# Patient Record
Sex: Female | Born: 1960
Health system: Southern US, Community
[De-identification: ages and names within clinical notes are randomized; demographics above are authoritative.]

## PROBLEM LIST (undated history)

## (undated) DIAGNOSIS — E119 Type 2 diabetes mellitus without complications: Secondary | ICD-10-CM

## (undated) DIAGNOSIS — C50919 Malignant neoplasm of unspecified site of unspecified female breast: Secondary | ICD-10-CM

## (undated) DIAGNOSIS — G4730 Sleep apnea, unspecified: Secondary | ICD-10-CM

## (undated) DIAGNOSIS — C801 Malignant (primary) neoplasm, unspecified: Secondary | ICD-10-CM

## (undated) DIAGNOSIS — I10 Essential (primary) hypertension: Secondary | ICD-10-CM

## (undated) DIAGNOSIS — Z9221 Personal history of antineoplastic chemotherapy: Secondary | ICD-10-CM

## (undated) DIAGNOSIS — Z85528 Personal history of other malignant neoplasm of kidney: Secondary | ICD-10-CM

## (undated) DIAGNOSIS — Z923 Personal history of irradiation: Secondary | ICD-10-CM

## (undated) HISTORY — DX: Malignant neoplasm of unspecified site of unspecified female breast: C50.919

## (undated) HISTORY — DX: Type 2 diabetes mellitus without complications: E11.9

## (undated) HISTORY — PX: COLONOSCOPY: SHX174

## (undated) HISTORY — DX: Essential (primary) hypertension: I10

## (undated) HISTORY — DX: Malignant (primary) neoplasm, unspecified: C80.1

## (undated) HISTORY — PX: BREAST SURGERY: SHX581

---

## 2005-03-15 HISTORY — PX: OTHER SURGICAL HISTORY: SHX169

## 2005-12-03 ENCOUNTER — Inpatient Hospital Stay (HOSPITAL_COMMUNITY): Admission: RE | Admit: 2005-12-03 | Discharge: 2005-12-06 | Payer: Self-pay | Admitting: General Surgery

## 2006-03-25 ENCOUNTER — Ambulatory Visit (HOSPITAL_COMMUNITY): Admission: RE | Admit: 2006-03-25 | Discharge: 2006-03-25 | Payer: Self-pay | Admitting: Urology

## 2010-09-22 DIAGNOSIS — D3A093 Benign carcinoid tumor of the kidney: Secondary | ICD-10-CM | POA: Insufficient documentation

## 2017-03-15 HISTORY — PX: BREAST LUMPECTOMY: SHX2

## 2017-07-14 ENCOUNTER — Other Ambulatory Visit: Payer: Self-pay | Admitting: Specialist

## 2017-07-14 DIAGNOSIS — N649 Disorder of breast, unspecified: Secondary | ICD-10-CM

## 2017-07-14 DIAGNOSIS — R928 Other abnormal and inconclusive findings on diagnostic imaging of breast: Secondary | ICD-10-CM

## 2017-08-03 ENCOUNTER — Ambulatory Visit
Admission: RE | Admit: 2017-08-03 | Discharge: 2017-08-03 | Disposition: A | Discharge status: Home / Self Care | Payer: 59 | Source: Ambulatory Visit | Attending: Specialist | Admitting: Specialist

## 2017-08-03 DIAGNOSIS — N649 Disorder of breast, unspecified: Secondary | ICD-10-CM

## 2017-08-03 DIAGNOSIS — R928 Other abnormal and inconclusive findings on diagnostic imaging of breast: Secondary | ICD-10-CM

## 2017-08-16 ENCOUNTER — Other Ambulatory Visit: Payer: Self-pay | Admitting: General Surgery

## 2017-08-16 DIAGNOSIS — C50211 Malignant neoplasm of upper-inner quadrant of right female breast: Secondary | ICD-10-CM

## 2017-08-17 ENCOUNTER — Telehealth: Payer: Self-pay | Admitting: Nurse Practitioner

## 2017-08-17 NOTE — Telephone Encounter (Signed)
Referral received from Dr. Dalbert Batman for new dx of breast cancer. Pt has been scheduled for the pt to see Cira Rue, NP/Dr. Burr Medico on 6/7 at 130pm. Pt aware to arrive 30 minutes early.

## 2017-08-19 ENCOUNTER — Encounter: Payer: Self-pay | Admitting: *Deleted

## 2017-08-19 ENCOUNTER — Telehealth: Payer: Self-pay | Admitting: Emergency Medicine

## 2017-08-19 ENCOUNTER — Encounter: Payer: Self-pay | Admitting: Radiation Oncology

## 2017-08-19 ENCOUNTER — Encounter: Payer: Self-pay | Admitting: Nurse Practitioner

## 2017-08-19 ENCOUNTER — Inpatient Hospital Stay: Payer: 59 | Attending: Nurse Practitioner | Admitting: Nurse Practitioner

## 2017-08-19 VITALS — BP 134/87 | HR 70 | Temp 98.5°F | Resp 17 | Ht 65.0 in | Wt 175.5 lb

## 2017-08-19 DIAGNOSIS — C50219 Malignant neoplasm of upper-inner quadrant of unspecified female breast: Secondary | ICD-10-CM | POA: Insufficient documentation

## 2017-08-19 DIAGNOSIS — F329 Major depressive disorder, single episode, unspecified: Secondary | ICD-10-CM

## 2017-08-19 DIAGNOSIS — C50211 Malignant neoplasm of upper-inner quadrant of right female breast: Secondary | ICD-10-CM | POA: Diagnosis present

## 2017-08-19 DIAGNOSIS — I10 Essential (primary) hypertension: Secondary | ICD-10-CM

## 2017-08-19 DIAGNOSIS — R200 Anesthesia of skin: Secondary | ICD-10-CM

## 2017-08-19 DIAGNOSIS — Z170 Estrogen receptor positive status [ER+]: Secondary | ICD-10-CM | POA: Diagnosis not present

## 2017-08-19 DIAGNOSIS — Z800 Family history of malignant neoplasm of digestive organs: Secondary | ICD-10-CM

## 2017-08-19 DIAGNOSIS — Z8553 Personal history of malignant neoplasm of renal pelvis: Secondary | ICD-10-CM

## 2017-08-19 DIAGNOSIS — G4700 Insomnia, unspecified: Secondary | ICD-10-CM

## 2017-08-19 NOTE — Progress Notes (Addendum)
[place]  [phone] [phone]  Clinic New Consult Note   Patient Care Team: Patient, No Pcp Per as PCP - General (General Practice) 08/19/2017  CHIEF COMPLAINTS/PURPOSE OF CONSULTATION:  Right breast cancer    REFERRED BY: Dr. Fanny Skates     Malignant neoplasm of upper-inner quadrant of breast in female, estrogen receptor positive ([place])   06/07/2017 Mammogram    BILAT SCREENING MAMMOGRAM  There is an irregular focal asymmetry or spiculated mass in the upper inner right breast middle third, located 7-8 cm from the nipple with a single punctate calcification posteriorly.  Additionally there is a small irregular mass or focal asymmetry more anteriorly in the inner central right breast, 5 cm from the nipple.  Between these 2 masses there is a stable grouping of calcifications.  Left breast is negative      06/20/2017 Mammogram    Right unilateral diagnostic mammogram: Redemonstrated irregular masslike focal asymmetry with architectural distortion in the upper inner breast with an associated punctate calcification measuring 1.1 cm.  Anterior to this is a more subtle area of focal architectural distortion.  Overall the area spans approximately 4 cm      06/20/2017 Breast US    Targeted ultrasound of the right breast reveals at 2 o'clock position 3-4 cm from the nipple there is a subtle irregular hypoechoic area with shadowing measuring 8 mm.  Incidentally noted at 3 o'clock position 6 cm from the nipple is an oval well-circumscribed hypoechoic mass measuring 1.6 x 0.8 x 1.1 cm      06/30/2017 Initial Biopsy    [place] needle core biopsy:  Diagnosis: Fibrotic tissue with increased adenosis, no tumor seen. Immunostains returned.  All glandular elements in the core biopsies are positive for CK 5/6, high molecular weight cytokeratin and E-Cadherin.  There is no focal increase in proliferation with a Ki-67 stain.  Both p63 and smooth muscle myosin exhibit  intact basilar and myoepithelial layers surrounding all glandular elements in the core biopsy sections.  These findings support a benign diagnosis.      07/12/2017 Mammogram    Diagnostic mammogram, post biopsy clip imaging: Biopsy clip is approximately 1.3 cm medial to the suspicious mass which was biopsied on [place]. There is a persistent asymmetry at the site of concern in the medial right breast middle depth. Further biopsy was recommended       08/03/2017 Pathology Results    Diagnosis 1. Breast, right, needle core biopsy, UIQ - INVASIVE MAMMARY CARCINOMA. - MAMMARY CARCINOMA IN SITU. - SEE MICROSCOPIC DESCRIPTION. 2. Breast, right, needle core biopsy, UIQ - INVASIVE MAMMARY CARCINOMA. - MAMMARY CARCINOMA IN SITU. - SEE MICROSCOPIC DESCRIPTION.  Microscopic Comment 1. There is invasive mammary carcinoma which has features suggestive of invasive lobular carcinoma. There is also a separate component which may represent an invasive ductal component. E-Cadherin immunohistochemistry will be performed as well as prognostic profile. 2. E-Cadherin and breast prognostic profile will be performed.  ADDENDUM: 1,2. Immunohistochemistry for E-Cadherin shows an invasive and in situ component that is E-Cadherin negative consistent with lobular carcinoma. There is also a separate morphologic component which is strongly E-Cadherin positive consistent with invasive and in situ ductal carcinoma. Basal cell markers for p63, calponin and smooth muscle myosin are negative in the invasive component supporting the diagnosis. (JDP:kh 08-04-17)  1. PROGNOSTIC INDICATORS Results: IMMUNOHISTOCHEMICAL AND MORPHOMETRIC ANALYSIS PERFORMED MANUALLY Estrogen Receptor: 100%, POSITIVE, STRONG STAINING INTENSITY Progesterone Receptor: 10%, POSITIVE, STRONG STAINING INTENSITY Proliferation Marker Ki67: 20%  1. FLUORESCENCE IN-SITU HYBRIDIZATION Results: HER2 - NEGATIVE RATIO OF HER2/CEP17 SIGNALS 1.41 AVERAGE HER2  COPY NUMBER PER CELL 1.90  2. PROGNOSTIC INDICATORS Results: IMMUNOHISTOCHEMICAL AND MORPHOMETRIC ANALYSIS PERFORMED MANUALLY Estrogen Receptor: 100%, POSITIVE, STRONG STAINING INTENSITY Progesterone Receptor: 20%, POSITIVE, STRONG STAINING INTENSITY Proliferation Marker Ki67: 20%  2. FLUORESCENCE IN-SITU HYBRIDIZATION Results: HER2 - NEGATIVE RATIO OF HER2/CEP17 SIGNALS 1.25 AVERAGE HER2 COPY NUMBER PER CELL 2.00      08/19/2017 Initial Diagnosis    Malignant neoplasm of upper-inner quadrant of breast in female, estrogen receptor positive ([place])      08/19/2017 Cancer Staging    Staging form: Breast, AJCC 8th Edition - Clinical stage from 08/19/2017: cT1c, cN0, cM0, ER+, PR+, HER2- - Signed by Alla Feeling, NP on 08/19/2017       HISTORY OF PRESENTING ILLNESS:  Vonya Ohalloran Roets 57 y.o. female is here because of newly diagnosed right breast cancer. She underwent screening mammogram in [place], [place] on 06/07/17 which showed 2 irregular masses vs focal asymmetry in the upper inner quadrant of the right breast. Reports compliance with annual screening mammograms without previous abnormality of breast biopsy. She has a history of right breast calcifications in the area which have been stable on mammogram. Targeted ultrasound of the right breast revealed an irregular hypoechoic area with shadowing measuring 8 mm at the 2 o'clock position, 2-4 cm from the nipple, as well as an oval, well-circumscribed hypoechoic mass measuring 1.6x0.8x1.1 cm at the 3 o'clock position, 6 cm from the nipple. US-guided core biopsy on 06/30/17 showed fibrotic tissue with increased adenosis but no malignant cells. She underwent further imaging due to the high suspicion for cancer. Diagnostic mammogram after biopsy clip placement revealed persistent asymmetry at the site of concern in the medial right breast middle depth. Repeat biopsy on 08/03/17 of the 2 areas positive for invasive mammary carcinoma. Immunohistochemistry  shows an invasive and in-situ component that is that is E-Cadherin negative, consistent with lobular carcinoma; there is also a separate morphologic component which is strongly E-Cadherin positive, consistent with invasive and in-situ ductal carcinoma. Prognostic indicators for both samples were ER/PR positive, HER2 negative. She was referred to Dr. Fanny Skates to discuss surgical options.    GYN HISTORY  Menarchal: age 21 LMP: 05/13/17, irregular Contraceptive: remotely during teenage years HRT: None G5P1, one son committed suicide   She has past medical history of renal cell carcinoma in 2007 s/p right partial nephrectomy, eventually discharged from f/u and controlled HTN. She lives at home in [place] with her spouse and mother. Husband works 3rd shift. 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 She is independent of all ADLs and drives herself. Denies drug or tobacco history, drinks 2 beers per day. Family history positive for stomach cancer in [facility] and kidney cancer in [facility].   Today she has soreness in her right breast and reports she did bruise significantly during biopsy, no nipple discharge, inversion, or skin changes. No fatigue or decreased appetite. Has hot flashes intermittently at night for about 2 months. She has few months history of right leg numbness and tingling without back pain or weakness. She will see PCP for this on 10/06/17. She has difficulty sleeping and mild depression since her diagnosis. Takes OTC sleep aide with benadryl which helps.   MEDICAL HISTORY:  Past Medical History:  Diagnosis Date  . Breast cancer ([place])   . [facility])    renal cell carcinoma 2007 s/p partial right nephrectomy   .  Hypertension     SURGICAL HISTORY: History reviewed. No pertinent surgical history.  SOCIAL HISTORY: Social History   Socioeconomic History  . Marital status: Married    Spouse name: Not on file  . Number of children: 4  . Years of education: Not on file   . Highest education level: Not on file  Occupational History  . Occupation: makes cookies    Employer: [employer]  Social Needs  . Financial resource strain: Not on file  . Food insecurity:    Worry: Not on file    Inability: Not on file  . Transportation needs:    Medical: Not on file    Non-medical: Not on file  Tobacco Use  . Smoking status: Never Smoker  . Smokeless tobacco: Never Used  Substance and Sexual Activity  . Alcohol use: Yes    Alcohol/week: 1.2 oz    Types: 2 Cans of beer per week    Comment: 2 beers per day  . Drug use: Not Currently  . Sexual activity: Not on file  Lifestyle  . Physical activity:    Days per week: Not on file    Minutes per session: Not on file  . Stress: Not on file  Relationships  . Social connections:    Talks on phone: Not on file    Gets together: Not on file    Attends religious service: Not on file    Active member of club or organization: Not on file    Attends meetings of clubs or organizations: Not on file    Relationship status: Not on file  . Intimate partner violence:    Fear of current or ex partner: Not on file    Emotionally abused: Not on file    Physically abused: Not on file    Forced sexual activity: Not on file  Other Topics Concern  . Not on file  Social History Narrative  . Not on file    FAMILY HISTORY: Family History  Problem Relation Age of Onset  . Cancer Maternal Grandmother        stomach  . Cancer Maternal Grandfather        kidney cancer    ALLERGIES:  has No Known Allergies.  MEDICATIONS:  Current Outpatient Medications  Medication Sig Dispense Refill  . amLODipine (NORVASC) 5 MG tablet     . benazepril-hydrochlorthiazide (LOTENSIN HCT) 5-6.25 MG tablet Take 1 tablet by mouth daily.    . diphenhydrAMINE HCl, Sleep, (SLEEP AID) 25 MG CAPS Take by mouth.    Marland Kitchen omeprazole (PRILOSEC) 10 MG capsule Take 10 mg by mouth daily.     No current facility-administered medications for this visit.      REVIEW OF SYSTEMS:   Constitutional: Denies fatigue, fevers, chills or abnormal night sweats Eyes: Denies blurriness of vision, double vision or watery eyes Ears, nose, mouth, throat, and face: Denies mucositis or sore throat Respiratory: Denies cough, dyspnea or wheezes Cardiovascular: Denies palpitation, chest discomfort or lower extremity swelling Gastrointestinal:  Denies nausea, vomiting, constipation, diarrhea, heartburn or change in bowel habits Skin: Denies abnormal skin rashes Lymphatics: Denies new lymphadenopathy or easy bruising Neurological:Denies new weaknesses (+) right leg numbness and tingling x3 - 4 months (+) mild intermittent hot flashes at night x2 months Behavioral/Psych: Mood is stable, no new changes (+) difficulty sleeping (+) mild depression since diagnosis Breasts: Denies nipple discharge or inversion, skin edema (+) Significant bruising with initial breast biopsy (+) occasional right breast soreness  All other systems  were reviewed with the patient and are negative.  PHYSICAL EXAMINATION: ECOG PERFORMANCE STATUS: 0 - Asymptomatic  Vitals:   08/19/17 1404  BP: 134/87  Pulse: 70  Resp: 17  Temp: 98.5 F (36.9 C)  SpO2: 98%   Filed Weights   08/19/17 1404  Weight: 175 lb 8 oz (79.6 kg)    GENERAL:alert, no distress and comfortable SKIN: no rashes or significant lesions EYES: normal, conjunctiva are pink and non-injected, sclera clear LYMPH:  no palpable cervical, supraclavicular, or axillary lymphadenopathy  LUNGS: clear to auscultation with normal breathing effort HEART: regular rate & rhythm and no murmurs and no lower extremity edema ABDOMEN:abdomen soft, non-tender and normal bowel sounds Musculoskeletal:no cyanosis of digits and no clubbing  PSYCH: alert & oriented x 3 with fluent speech NEURO: no focal motor/sensory deficits Breasts: Inspection shows them to be symmetrical without nipple discharge or inversion. No palpable mass in left  breast or axilla. (+) right breast s/p biopsy to the upper inner quadrant with ecchymosis, there is 1 cm soft tissue fullness at the 2:00 o'clock position which may represent hematoma vs mass   LABORATORY DATA:  I have reviewed the data as listed No flowsheet data found.  PATHOLOGY   [place] needle core biopsy:  Diagnosis: 06/30/17 Fibrotic tissue with increased adenosis, no tumor seen. Immunostains returned.  All glandular elements in the core biopsies are positive for CK 5/6, high molecular weight cytokeratin and E-Cadherin.  There is no focal increase in proliferation with a Ki-67 stain.  Both p63 and smooth muscle myosin exhibit intact basilar and myoepithelial layers surrounding all glandular elements in the core biopsy sections.  These findings support a benign diagnosis.   Diagnosis 08/03/17 1. Breast, right, needle core biopsy, UIQ - INVASIVE MAMMARY CARCINOMA. - MAMMARY CARCINOMA IN SITU. - SEE MICROSCOPIC DESCRIPTION. 2. Breast, right, needle core biopsy, UIQ - INVASIVE MAMMARY CARCINOMA. - MAMMARY CARCINOMA IN SITU. - SEE MICROSCOPIC DESCRIPTION.  Microscopic Comment 1. There is invasive mammary carcinoma which has features suggestive of invasive lobular carcinoma. There is also a separate component which may represent an invasive ductal component. E-Cadherin immunohistochemistry will be performed as well as prognostic profile. 2. E-Cadherin and breast prognostic profile will be performed.  ADDENDUM: 1,2. Immunohistochemistry for E-Cadherin shows an invasive and in situ component that is E-Cadherin negative consistent with lobular carcinoma. There is also a separate morphologic component which is strongly E-Cadherin positive consistent with invasive and in situ ductal carcinoma. Basal cell markers for p63, calponin and smooth muscle myosin are negative in the invasive component supporting the diagnosis. (JDP:kh 08-04-17)  1. PROGNOSTIC INDICATORS Results: IMMUNOHISTOCHEMICAL AND  MORPHOMETRIC ANALYSIS PERFORMED MANUALLY Estrogen Receptor: 100%, POSITIVE, STRONG STAINING INTENSITY Progesterone Receptor: 10%, POSITIVE, STRONG STAINING INTENSITY Proliferation Marker Ki67: 20%  1. FLUORESCENCE IN-SITU HYBRIDIZATION Results: HER2 - NEGATIVE RATIO OF HER2/CEP17 SIGNALS 1.41 AVERAGE HER2 COPY NUMBER PER CELL 1.90  2. PROGNOSTIC INDICATORS Results: IMMUNOHISTOCHEMICAL AND MORPHOMETRIC ANALYSIS PERFORMED MANUALLY Estrogen Receptor: 100%, POSITIVE, STRONG STAINING INTENSITY Progesterone Receptor: 20%, POSITIVE, STRONG STAINING INTENSITY Proliferation Marker Ki67: 20%  2. FLUORESCENCE IN-SITU HYBRIDIZATION Results: HER2 - NEGATIVE RATIO OF HER2/CEP17 SIGNALS 1.25 AVERAGE HER2 COPY NUMBER PER CELL 2.00    RADIOGRAPHIC STUDIES: I have personally reviewed the radiological images as listed and agreed with the findings in the report. Mm Clip Placement Right  Result Date: 08/03/2017 CLINICAL DATA:  Status post stereotactic guided core needle biopsy of a small asymmetry with architectural distortion in the posterior aspect of the upper inner quadrant  of the right breast and an area of architectural distortion more anteriorly in the upper inner quadrant of the right breast. EXAM: DIAGNOSTIC BILATERAL MAMMOGRAM POST STEREOTACTIC BIOPSY COMPARISON:  Previous exam(s). FINDINGS: 2D and 3D mammographic images were obtained following stereotactic guided biopsy of the recently demonstrated distortion in the upper inner small quadrant of the right breast and asymmetry with architectural the area of architectural distortion more anteriorly in the upper inner quadrant of the right breast. These demonstrate a post biopsy cavity at the medial aspect of the small asymmetry with architectural distortion with a coil shaped biopsy marker clip 9 mm posterior to the center of asymmetry with architectural distortion. A small portion medial aspect of the asymmetry with architectural distortion  appears to be absent following biopsy. An X shaped biopsy marker clip is located at the location of biopsied architectural distortion more anteriorly in the upper inner quadrant of the right breast. A previously placed linear shaped biopsy marker clip is more medially located in that region of the breast. IMPRESSION: X shaped biopsy marker clip at the location of biopsied architectural distortion more anteriorly in the upper inner quadrant of the right breast and coil shaped biopsy marker clip 9 mm posterior to the center of asymmetry with architectural distortion more posteriorly in the upper inner quadrant of the right breast. Final Assessment: Post Procedure Mammograms for Marker Placement Electronically Signed   By: Claudie Revering M.D.   On: 08/03/2017 13:02   Mm Rt Breast Bx W Loc Dev 1st Lesion Image Bx Spec Stereo Guide  Addendum Date: 08/04/2017   ADDENDUM REPORT: 08/04/2017 15:07 ADDENDUM: Pathology revealed INVASIVE MAMMARY CARCINOMA, MAMMARY CARCINOMA IN SITU of RIGHT breast, upper inner quadrant for the more posteriorly biopsied small asymmetry and architectural distortion. There is invasive mammary carcinoma which has features suggestive of invasive lobular carcinoma. There is also a separate component which may represent an invasive ductal component. This was found to be concordant by Dr. Claudie Revering. Pathology revealed INVASIVE MAMMARY CARCINOMA. MAMMARY CARCINOMA IN SITU of RIGHT breast, upper inner quadrant for the more anteriorly biopsied architectural distortion. This was found to be concordant by Dr. Claudie Revering. Pathology results will be discussed with the patient by Dr. Valma Cava of [facility], [place], [place] per his request. Dr. Valma Cava will also make any surgical referral necessary. A bracketed needle localization and excision of both areas and the tissue in between is recommended at the time of surgery if mastectomy is not planned. The coil shaped and X  shaped biopsy marker clips are located 4.6 cm apart on the post clip mammogram images. Pathology results reported by Roselind Messier, RN on 08/04/2017. Electronically Signed   By: Claudie Revering M.D.   On: 08/04/2017 15:07   Result Date: 08/04/2017 CLINICAL DATA:  Small asymmetry with associated architectural distortion in the upper inner quadrant of the right breast at recent mammography at [facility]. Recent right breast ultrasound-guided core needle biopsy with benign results. EXAM: RIGHT BREAST STEREOTACTIC CORE NEEDLE BIOPSY COMPARISON:  Previous exams. FINDINGS: The patient and I discussed the procedure of stereotactic-guided biopsy including benefits and alternatives. We discussed the high likelihood of a successful procedure. We discussed the risks of the procedure including infection, bleeding, tissue injury, clip migration, and inadequate sampling. Informed written consent was given. The usual time out protocol was performed immediately prior to the procedure. SITE 1: SMALL ASYMMETRY WITH ARCHITECTURAL DISTORTION IN THE UPPER INNER QUADRANT OF THE RIGHT BREAST Using sterile technique and  1% Lidocaine as local anesthetic, under stereotactic guidance, a 9 gauge vacuum assisted device was used to perform core needle biopsy of the recently demonstrated small asymmetry is architectural distortion in the upper inner quadrant of the right breast using a medial approach. Lesion quadrant: Upper inner quadrant At the conclusion of the procedure, a coil shaped tissue marker clip was deployed into the biopsy cavity. Follow-up 2-view mammogram was performed and dictated separately. SITE 2: ARCHITECTURAL DISTORTION IN THE UPPER INNER QUADRANT OF THE RIGHT BREAST Using sterile technique and 1% Lidocaine as local anesthetic, under stereotactic guidance, a 9 gauge vacuum assisted device was used to perform core needle biopsy of the recently demonstrated architectural distortion anterior to a group of mammographically stable  calcifications in the upper inner right breast using a medial approach. Lesion quadrant: Upper inner quadrant At the conclusion of the procedure, a X shaped tissue marker clip was deployed into the biopsy cavity. Follow-up 2-view mammogram was performed and dictated separately. IMPRESSION: Stereotactic-guided biopsy of a small asymmetry of architectural distortion in the upper inner quadrant of the right breast and an area of architectural distortion more anteriorly in the upper inner quadrant of the right breast adjacent to a mammographically stable group of calcifications. No apparent complications. Electronically Signed: By: Claudie Revering M.D. On: 08/03/2017 12:49   Mm Rt Breast Bx W Loc Dev Ea Ad Lesion Img Bx Spec Stereo Guide  Addendum Date: 08/04/2017   ADDENDUM REPORT: 08/04/2017 15:07 ADDENDUM: Pathology revealed INVASIVE MAMMARY CARCINOMA, MAMMARY CARCINOMA IN SITU of RIGHT breast, upper inner quadrant for the more posteriorly biopsied small asymmetry and architectural distortion. There is invasive mammary carcinoma which has features suggestive of invasive lobular carcinoma. There is also a separate component which may represent an invasive ductal component. This was found to be concordant by Dr. Claudie Revering. Pathology revealed INVASIVE MAMMARY CARCINOMA. MAMMARY CARCINOMA IN SITU of RIGHT breast, upper inner quadrant for the more anteriorly biopsied architectural distortion. This was found to be concordant by Dr. Claudie Revering. Pathology results will be discussed with the patient by Dr. Valma Cava of [facility] [place], [place], [place] per his request. Dr. Valma Cava will also make any surgical referral necessary. A bracketed needle localization and excision of both areas and the tissue in between is recommended at the time of surgery if mastectomy is not planned. The coil shaped and X shaped biopsy marker clips are located 4.6 cm apart on the post clip mammogram images. Pathology  results reported by Roselind Messier, RN on 08/04/2017. Electronically Signed   By: Claudie Revering M.D.   On: 08/04/2017 15:07   Result Date: 08/04/2017 CLINICAL DATA:  Small asymmetry with associated architectural distortion in the upper inner quadrant of the right breast at recent mammography at [facility]. Recent right breast ultrasound-guided core needle biopsy with benign results. EXAM: RIGHT BREAST STEREOTACTIC CORE NEEDLE BIOPSY COMPARISON:  Previous exams. FINDINGS: The patient and I discussed the procedure of stereotactic-guided biopsy including benefits and alternatives. We discussed the high likelihood of a successful procedure. We discussed the risks of the procedure including infection, bleeding, tissue injury, clip migration, and inadequate sampling. Informed written consent was given. The usual time out protocol was performed immediately prior to the procedure. SITE 1: SMALL ASYMMETRY WITH ARCHITECTURAL DISTORTION IN THE UPPER INNER QUADRANT OF THE RIGHT BREAST Using sterile technique and 1% Lidocaine as local anesthetic, under stereotactic guidance, a 9 gauge vacuum assisted device was used to perform core needle biopsy of the recently  demonstrated small asymmetry is architectural distortion in the upper inner quadrant of the right breast using a medial approach. Lesion quadrant: Upper inner quadrant At the conclusion of the procedure, a coil shaped tissue marker clip was deployed into the biopsy cavity. Follow-up 2-view mammogram was performed and dictated separately. SITE 2: ARCHITECTURAL DISTORTION IN THE UPPER INNER QUADRANT OF THE RIGHT BREAST Using sterile technique and 1% Lidocaine as local anesthetic, under stereotactic guidance, a 9 gauge vacuum assisted device was used to perform core needle biopsy of the recently demonstrated architectural distortion anterior to a group of mammographically stable calcifications in the upper inner right breast using a medial approach. Lesion quadrant: Upper  inner quadrant At the conclusion of the procedure, a X shaped tissue marker clip was deployed into the biopsy cavity. Follow-up 2-view mammogram was performed and dictated separately. IMPRESSION: Stereotactic-guided biopsy of a small asymmetry of architectural distortion in the upper inner quadrant of the right breast and an area of architectural distortion more anteriorly in the upper inner quadrant of the right breast adjacent to a mammographically stable group of calcifications. No apparent complications. Electronically Signed: By: Claudie Revering M.D. On: 08/03/2017 12:49    ASSESSMENT & PLAN: Ashlea Dusing is a pleasant 57 yo perimenopausal female with PMH of renal cell carcinoma in 2007 s/p nephrectomy and HTN, found to have 2 right breast masses on screening mammogram.   1. Malignant neoplasm of upper inner quadrant of right female breast, multifocal disease; cT1b, cN0, cM0, stage I; multifocal disease with one lesion ER 100% positive, PR 10% positive, HER2 negative, Ki67 20%, second lesion ER 100% positive, PR 20% positive, HER2 negative, Ki67 20%; IHC stains consistent with invasive lobular and in situ and invasive ductal and in situ carcinomas -We reviewed her medical record including imaging and pathology in detail with the patient.  She has biopsy-proven multifocal disease in the upper inner quadrant of the right breast, consistent with lobular and ductal carcinomas.  She is scheduled to undergo bilateral breast MRI next week to reevaluate right breast cancers and rule out abnormality in the left breast.  We would likely recommend additional biopsy if any suspicious abnormalities were seen.   -If no worrisome findings on her MRI, she would likely be a candidate for breast conserving surgery, she has seen Dr. Dalbert Batman. -We reviewed adjuvant chemotherapy would depend on the final surgical path and cancer biology including any high risk features.  Her phone recommends Oncotype on the surgical sample  if greater than 1 cm or MammaPrint with positive lymph node; testing would further stratify her risk of distant cancer recurrence and help define the potential benefit of adjuvant chemo.  Patient agrees -The patient met breast RN navigator Sigmund Hazel who will follow her surgical sample and order Oncotype as indicated -Given her ER/PR positive disease she would benefit from adjuvant endocrine therapy to reduce risk of cancer recurrence, we briefly discussed the risks and benefits of antiestrogen therapy.  Given lobular carcinoma she would likely be on therapy 7 to 10 years; will discuss further as the time comes. -If she does undergo breast conserving surgery, she would likely benefit from adjuvant radiation to reduce her risk of local recurrence.  I referred her to radiation oncology today. -Given her personal history of renal cell carcinoma and family history of cancer I recommend genetic testing to evaluate for pathogenic mutation, she agrees.  -Will follow her MRI results and surgical pathology with Oncotype. Will see her after radiation, or sooner if Oncotype  shows high risk -F/u up open at this point  2. Genetics -Given her family history of cancer and personal history of renal cell carcinoma and now breast cancer, I recommend genetic testing; she agreed. I referred her.  3.  HTN -Appears well-controlled on amlodipine and benazepril-HCTZ  4.  Insomnia, depression -She has difficulty sleeping and feelings of mild depression since her diagnosis.  She has not told her mother who is in poor health.  She has told her husband and sons who are very supportive.  She presents alone today but notes that next time she will bring family member with her.  I encouraged to let her family support her. She declined social work and chaplain referral today  5.  Right leg numbness, tingling -Etiology unclear, she plans to meet with PCP next month for evaluation  PLAN: -Imaging, pathology reviewed -Bilateral  breast MRI next week -Pending breast surgery, Oncotype if surgical sample >1 cm or mammaprint if positive LN -Referrals to genetics, rad onc -F/u after radiation, or sooner if oncotype is high risk   Orders Placed This Encounter  Procedures  . Ambulatory referral to Genetics    Referral Priority:   Routine    Referral Type:   Consultation    Referral Reason:   Specialty Services Required    Number of Visits Requested:   1  . Ambulatory referral to Radiation Oncology    Referral Priority:   Routine    Referral Type:   Consultation    Referral Reason:   Specialty Services Required    Requested Specialty:   Radiation Oncology    Number of Visits Requested:   1    All questions were answered. The patient knows to call the clinic with any problems, questions or concerns. I spent 45 minutes counseling the patient face to face. The total time spent in the appointment was 60 minutes and more than 50% was on counseling.     Alla Feeling, NP 08/19/2017   I have seen the patient, examined her. I agree with the assessment and and plan and have edited the notes.   Ms Stiner is a 57 yo perimenopausal female with PMH of HTN and depression, presented with screening discovered right breast cancer.I have reviewed her mammogram, ultrasound, and biopsy results with her in details.  She has a  2 small lesions in the right breast, biopsy showed invasive lobular carcinoma, ER and PR strongly positive, HER-2 negative, with Ki6720%.  She is scheduled to have bilateral breast MRI next weekk, to rule out more multifocal disease, re-evaluate size of the tumor and adenopathy.  If no surprises on the MRI, she would be a candidate for breast conservation. I recommend her to have a Oncotype DX on her surgical sample if the tumor is 1cm or larger, or mammaprint if she has positive lymph nodes, to further define her risk of recurrence and the benefit of adjuvant chemotherapy.  Given her lobular histology, low Ki-67, I  think this is likely low risk disease.  I strongly recommend her to consider adjuvant antiestrogen therapy.  She is perimenopausal, I would recommend tamoxifen.  Potential benefits and side effects discussed with her. I recommend Tamoxifen. Potential benefits and side effects discussed, she is interested.   I will see her after radiation, or sooner if her oncotype shows high risk disease  Truitt Merle  08/19/2017

## 2017-08-19 NOTE — Telephone Encounter (Signed)
Called both the pt and pts husband to see if they were coming to new appt today. The pts phone is disconnected and the husband has no VM set up. NP made aware.

## 2017-08-20 ENCOUNTER — Telehealth: Payer: Self-pay | Admitting: Nurse Practitioner

## 2017-08-20 ENCOUNTER — Telehealth: Payer: Self-pay | Admitting: Hematology

## 2017-08-20 NOTE — Telephone Encounter (Signed)
No LOS 6/7

## 2017-08-20 NOTE — Telephone Encounter (Signed)
Appointments scheduled letter/calendar mailed to patient per 6/7 sch msg

## 2017-08-22 NOTE — Progress Notes (Signed)
Location of Breast Cancer: Right Breast  Histology per Pathology Report:  08/03/17 Diagnosis 1. Breast, right, needle core biopsy, UIQ - INVASIVE MAMMARY CARCINOMA. - MAMMARY CARCINOMA IN SITU. - SEE MICROSCOPIC DESCRIPTION.  Receptor Status: ER(100%), PR(10%), Her2neu- (NEG), Ki-(20%)  2. Breast, right, needle core biopsy, UIQ - INVASIVE MAMMARY CARCINOMA. - MAMMARY CARCINOMA IN SITU. - SEE MICROSCOPIC DESCRIPTION.  Receptor Status: ER(100%), PR (20%), Her2-neu (NEG), Ki-(20%)  Did patient present with symptoms or was this found on screening mammography?: It was found on a screening mammogram.   Past/Anticipated interventions by surgeon, if any: Dr. Dalbert Batman. She will return to see Dr. Dalbert Batman after oncology appointments.   Past/Anticipated interventions by medical oncology, if any:  08/19/17 Cira Rue NP PLAN: -Imaging, pathology reviewed -Bilateral breast MRI next week (08/24/17 scheduled) -Pending breast surgery, Oncotype if surgical sample >1 cm or mammaprint if positive LN -Referrals to genetics, rad onc (genetics 09/28/17) -F/u after radiation, or sooner if oncotype is high risk    Lymphedema issues, if any:  No  Pain issues, if any:  No  SAFETY ISSUES:  Prior radiation? No  Pacemaker/ICD? No  Possible current pregnancy? No, she is post menopausal  Is the patient on methotrexate? No  Current Complaints / other details:    BP (!) 144/92 (BP Location: Left Arm, Patient Position: Sitting, Cuff Size: Normal)   Pulse 71   Temp 97.8 F (36.6 C) (Oral)   Resp 20   Ht 5' 6"  (1.676 m)   Wt 174 lb 6.4 oz (79.1 kg)   SpO2 99%   BMI 28.15 kg/m    Wt Readings from Last 3 Encounters:  08/23/17 174 lb 6.4 oz (79.1 kg)  08/19/17 175 lb 8 oz (79.6 kg)      Jennifer Harvey, Stephani Police, RN 08/22/2017,8:27 AM

## 2017-08-23 ENCOUNTER — Ambulatory Visit
Admission: RE | Admit: 2017-08-23 | Discharge: 2017-08-23 | Disposition: A | Discharge status: Home / Self Care | Payer: 59 | Source: Ambulatory Visit | Attending: Radiation Oncology | Admitting: Radiation Oncology

## 2017-08-23 ENCOUNTER — Other Ambulatory Visit: Payer: Self-pay

## 2017-08-23 ENCOUNTER — Encounter: Payer: Self-pay | Admitting: Radiation Oncology

## 2017-08-23 VITALS — BP 144/92 | HR 71 | Temp 97.8°F | Resp 20 | Ht 66.0 in | Wt 174.4 lb

## 2017-08-23 DIAGNOSIS — R200 Anesthesia of skin: Secondary | ICD-10-CM | POA: Insufficient documentation

## 2017-08-23 DIAGNOSIS — Z809 Family history of malignant neoplasm, unspecified: Secondary | ICD-10-CM | POA: Insufficient documentation

## 2017-08-23 DIAGNOSIS — R51 Headache: Secondary | ICD-10-CM | POA: Insufficient documentation

## 2017-08-23 DIAGNOSIS — Z79899 Other long term (current) drug therapy: Secondary | ICD-10-CM | POA: Diagnosis not present

## 2017-08-23 DIAGNOSIS — Z905 Acquired absence of kidney: Secondary | ICD-10-CM | POA: Diagnosis not present

## 2017-08-23 DIAGNOSIS — C50211 Malignant neoplasm of upper-inner quadrant of right female breast: Secondary | ICD-10-CM | POA: Insufficient documentation

## 2017-08-23 DIAGNOSIS — Z85528 Personal history of other malignant neoplasm of kidney: Secondary | ICD-10-CM | POA: Insufficient documentation

## 2017-08-23 DIAGNOSIS — Z170 Estrogen receptor positive status [ER+]: Principal | ICD-10-CM

## 2017-08-23 DIAGNOSIS — I10 Essential (primary) hypertension: Secondary | ICD-10-CM | POA: Diagnosis not present

## 2017-08-23 NOTE — Progress Notes (Signed)
Radiation Oncology         [phone] ________________________________  Initial outpatient Consultation  Name: Jennifer Harvey MRN: [id number]  Date: 08/23/2017  DOB: 01-Jan-1961  XQ:JJHERDE, No Pcp Per  Fanny Skates, MD   REFERRING PHYSICIAN: Fanny Skates, MD  DIAGNOSIS:    ICD-10-CM   1. Malignant neoplasm of upper-inner quadrant of right breast in female, estrogen receptor positive ([place]) [place] [facility] referral to Social Work   Z17.0   Cancer Staging Malignant neoplasm of upper-inner quadrant of breast in female, estrogen receptor positive ([place]) Staging form: Breast, AJCC 8th Edition - Clinical stage from 08/19/2017: cT1c, cN0, cM0, ER+, PR+, HER2- - Signed by Alla Feeling, NP on 08/19/2017 - Pathologic: No stage assigned - Unsigned  Clinical Stage IA, T1cN0M0 Right Breast UIQ Invasive mammary carcinoma, favoring lobular carcinoma, ER 100% / PR 10-20% / Her2 neg, Grade not reported  CHIEF COMPLAINT: Here to discuss management of right breast cancer  HISTORY OF PRESENT ILLNESS::Jennifer Harvey is a 57 y.o. female who presented with two areas of architectural distortion of the UIQ of right breast on mammography in [place].  2 irregular masses or focal asymmetries were appreciated in the inner central upper right breast with stable grouped calcifications between them.  The area spanned approximately 4 cm in total and on ultrasound there was an incidental oval well-circumscribed hypoechoic mass that was 16 mm in dimension at 3:00, not corresponding to anything from mammography.  2 biopsies showed invasive lobular carcinoma, ER 100%, PR 10-20%, HER2 negative. There was also  mammary carcinoma in situ in the biopsied tissue. Dr. Dalbert Batman has ordered an MRI for tomorrow. Medical oncology will consider oncotype testing based on surgical stage. Dr. Dalbert Batman believes lumpectomy may be possible, but needs MRI to be sure. The patient has a genetics appointment next month. She is  motivated for breast conservation. She is postmenopausal. She recently quit alcohol use and is a nonsmoker.   She reports that she had a partial nephrectomy for kidney cancer 12 years ago.  On review of systems, patient notes right leg numbness and restlessness at night. Also positive for headaches and mild difficulty swallowing nuts.  PREVIOUS RADIATION THERAPY: No  PAST MEDICAL HISTORY:  has a past medical history of Breast cancer ([place]), Cancer ([place]), and Hypertension.    PAST SURGICAL HISTORY: Past Surgical History:  Procedure Laterality Date  . CESAREAN SECTION  1994    FAMILY HISTORY: family history includes Cancer in her maternal grandfather and maternal grandmother.  SOCIAL HISTORY:  reports that she has never smoked. She has never used smokeless tobacco. She reports that she drinks about 1.2 oz of alcohol per week. She reports that she has current or past drug history.  ALLERGIES: Patient has no known allergies.  MEDICATIONS:  Current Outpatient Medications  Medication Sig Dispense Refill  . amLODipine (NORVASC) 5 MG tablet     . benazepril-hydrochlorthiazide (LOTENSIN HCT) 5-6.25 MG tablet Take 1 tablet by mouth daily.    . diphenhydrAMINE HCl, Sleep, (SLEEP AID) 25 MG CAPS Take by mouth.    Marland Kitchen omeprazole (PRILOSEC) 10 MG capsule Take 10 mg by mouth daily.     No current facility-administered medications for this encounter.     REVIEW OF SYSTEMS: A 10+ POINT REVIEW OF SYSTEMS WAS OBTAINED including neurology, dermatology, psychiatry, cardiac, respiratory, lymph, extremities, GI, GU, Musculoskeletal, constitutional, breasts, reproductive, HEENT.  All pertinent positives are noted in the HPI.  All others are negative.   PHYSICAL EXAM:  height is 5' 6"  (1.676 m) and weight is 174 lb 6.4 oz (79.1 kg). Her oral temperature is 97.8 F (36.6 C). Her blood pressure is 144/92 (abnormal) and her pulse is 71. Her respiration is 20 and oxygen saturation is 99%.   General: Alert and  oriented, in no acute distress HEENT: Head is normocephalic. Extraocular movements are intact. Oropharynx is clear. Neck: Neck is supple, no palpable cervical or supraclavicular lymphadenopathy. Heart: Regular in rate and rhythm with no rubs, or gallops. Soft systolic murmur  at the RUSB.  Chest: Clear to auscultation bilaterally, with no rhonchi, wheezes, or rales. Abdomen: Soft, nontender, nondistended, with no rigidity or guarding. Extremities: No cyanosis or edema. Lymphatics: see Neck Exam Skin: No concerning lesions. Musculoskeletal: symmetric strength and muscle tone throughout. Neurologic: Cranial nerves II through XII are grossly intact. No obvious focalities. Speech is fluent. Coordination is intact. Psychiatric: Judgment and insight are intact. Affect is appropriate. Breasts: non-specific thickening of the upper inner right breast. No other palpable masses appreciated in the breasts or axillae.  ECOG = 0  0 - Asymptomatic (Fully active, able to carry on all predisease activities without restriction)  1 - Symptomatic but completely ambulatory (Restricted in physically strenuous activity but ambulatory and able to carry out work of a light or sedentary nature. For example, light housework, office work)  2 - Symptomatic, <50% in bed during the day (Ambulatory and capable of all self care but unable to carry out any work activities. Up and about more than 50% of waking hours)  3 - Symptomatic, >50% in bed, but not bedbound (Capable of only limited self-care, confined to bed or chair 50% or more of waking hours)  4 - Bedbound (Completely disabled. Cannot carry on any self-care. Totally confined to bed or chair)  5 - Death   Eustace Pen MM, Creech RH, Tormey DC, et al. [phone]). "Toxicity and response criteria of the [facility]". [place] Oncol. 5 (6): 649-55   LABORATORY DATA:  No results found for: WBC, HGB, HCT, MCV, PLT CMP  No results found for: NA, K,  CL, CO2, GLUCOSE, BUN, CREATININE, CALCIUM, PROT, ALBUMIN, AST, ALT, ALKPHOS, BILITOT, GFRNONAA, GFRAA     RADIOGRAPHY: Mm Clip Placement Right  Result Date: 08/03/2017 CLINICAL DATA:  Status post stereotactic guided core needle biopsy of a small asymmetry with architectural distortion in the posterior aspect of the upper inner quadrant of the right breast and an area of architectural distortion more anteriorly in the upper inner quadrant of the right breast. EXAM: DIAGNOSTIC BILATERAL MAMMOGRAM POST STEREOTACTIC BIOPSY COMPARISON:  Previous exam(s). FINDINGS: 2D and 3D mammographic images were obtained following stereotactic guided biopsy of the recently demonstrated distortion in the upper inner small quadrant of the right breast and asymmetry with architectural the area of architectural distortion more anteriorly in the upper inner quadrant of the right breast. These demonstrate a post biopsy cavity at the medial aspect of the small asymmetry with architectural distortion with a coil shaped biopsy marker clip 9 mm posterior to the center of asymmetry with architectural distortion. A small portion medial aspect of the asymmetry with architectural distortion appears to be absent following biopsy. An X shaped biopsy marker clip is located at the location of biopsied architectural distortion more anteriorly in the upper inner quadrant of the right breast. A previously placed linear shaped biopsy marker clip is more medially located in that region of the breast. IMPRESSION: X shaped biopsy marker clip at the location  of biopsied architectural distortion more anteriorly in the upper inner quadrant of the right breast and coil shaped biopsy marker clip 9 mm posterior to the center of asymmetry with architectural distortion more posteriorly in the upper inner quadrant of the right breast. Final Assessment: Post Procedure Mammograms for Marker Placement Electronically Signed   By: Claudie Revering M.D.   On: 08/03/2017  13:02   Mm Rt Breast Bx W Loc Dev 1st Lesion Image Bx Spec Stereo Guide  Addendum Date: 08/04/2017   ADDENDUM REPORT: 08/04/2017 15:07 ADDENDUM: Pathology revealed INVASIVE MAMMARY CARCINOMA, MAMMARY CARCINOMA IN SITU of RIGHT breast, upper inner quadrant for the more posteriorly biopsied small asymmetry and architectural distortion. There is invasive mammary carcinoma which has features suggestive of invasive lobular carcinoma. There is also a separate component which may represent an invasive ductal component. This was found to be concordant by Dr. Claudie Revering. Pathology revealed INVASIVE MAMMARY CARCINOMA. MAMMARY CARCINOMA IN SITU of RIGHT breast, upper inner quadrant for the more anteriorly biopsied architectural distortion. This was found to be concordant by Dr. Claudie Revering. Pathology results will be discussed with the patient by Dr. Valma Cava of [facility], [place], [place] per his request. Dr. Valma Cava will also make any surgical referral necessary. A bracketed needle localization and excision of both areas and the tissue in between is recommended at the time of surgery if mastectomy is not planned. The coil shaped and X shaped biopsy marker clips are located 4.6 cm apart on the post clip mammogram images. Pathology results reported by Roselind Messier, RN on 08/04/2017. Electronically Signed   By: Claudie Revering M.D.   On: 08/04/2017 15:07   Result Date: 08/04/2017 CLINICAL DATA:  Small asymmetry with associated architectural distortion in the upper inner quadrant of the right breast at recent mammography at [facility]. Recent right breast ultrasound-guided core needle biopsy with benign results. EXAM: RIGHT BREAST STEREOTACTIC CORE NEEDLE BIOPSY COMPARISON:  Previous exams. FINDINGS: The patient and I discussed the procedure of stereotactic-guided biopsy including benefits and alternatives. We discussed the high likelihood of a successful procedure. We discussed the risks of  the procedure including infection, bleeding, tissue injury, clip migration, and inadequate sampling. Informed written consent was given. The usual time out protocol was performed immediately prior to the procedure. SITE 1: SMALL ASYMMETRY WITH ARCHITECTURAL DISTORTION IN THE UPPER INNER QUADRANT OF THE RIGHT BREAST Using sterile technique and 1% Lidocaine as local anesthetic, under stereotactic guidance, a 9 gauge vacuum assisted device was used to perform core needle biopsy of the recently demonstrated small asymmetry is architectural distortion in the upper inner quadrant of the right breast using a medial approach. Lesion quadrant: Upper inner quadrant At the conclusion of the procedure, a coil shaped tissue marker clip was deployed into the biopsy cavity. Follow-up 2-view mammogram was performed and dictated separately. SITE 2: ARCHITECTURAL DISTORTION IN THE UPPER INNER QUADRANT OF THE RIGHT BREAST Using sterile technique and 1% Lidocaine as local anesthetic, under stereotactic guidance, a 9 gauge vacuum assisted device was used to perform core needle biopsy of the recently demonstrated architectural distortion anterior to a group of mammographically stable calcifications in the upper inner right breast using a medial approach. Lesion quadrant: Upper inner quadrant At the conclusion of the procedure, a X shaped tissue marker clip was deployed into the biopsy cavity. Follow-up 2-view mammogram was performed and dictated separately. IMPRESSION: Stereotactic-guided biopsy of a small asymmetry of architectural distortion in the upper inner quadrant of the  right breast and an area of architectural distortion more anteriorly in the upper inner quadrant of the right breast adjacent to a mammographically stable group of calcifications. No apparent complications. Electronically Signed: By: Claudie Revering M.D. On: 08/03/2017 12:49   Mm Rt Breast Bx W Loc Dev Ea Ad Lesion Img Bx Spec Stereo Guide  Addendum Date:  08/04/2017   ADDENDUM REPORT: 08/04/2017 15:07 ADDENDUM: Pathology revealed INVASIVE MAMMARY CARCINOMA, MAMMARY CARCINOMA IN SITU of RIGHT breast, upper inner quadrant for the more posteriorly biopsied small asymmetry and architectural distortion. There is invasive mammary carcinoma which has features suggestive of invasive lobular carcinoma. There is also a separate component which may represent an invasive ductal component. This was found to be concordant by Dr. Claudie Revering. Pathology revealed INVASIVE MAMMARY CARCINOMA. MAMMARY CARCINOMA IN SITU of RIGHT breast, upper inner quadrant for the more anteriorly biopsied architectural distortion. This was found to be concordant by Dr. Claudie Revering. Pathology results will be discussed with the patient by Dr. Valma Cava of [facility] [place], [place], [place] per his request. Dr. Valma Cava will also make any surgical referral necessary. A bracketed needle localization and excision of both areas and the tissue in between is recommended at the time of surgery if mastectomy is not planned. The coil shaped and X shaped biopsy marker clips are located 4.6 cm apart on the post clip mammogram images. Pathology results reported by Roselind Messier, RN on 08/04/2017. Electronically Signed   By: Claudie Revering M.D.   On: 08/04/2017 15:07   Result Date: 08/04/2017 CLINICAL DATA:  Small asymmetry with associated architectural distortion in the upper inner quadrant of the right breast at recent mammography at [facility]. Recent right breast ultrasound-guided core needle biopsy with benign results. EXAM: RIGHT BREAST STEREOTACTIC CORE NEEDLE BIOPSY COMPARISON:  Previous exams. FINDINGS: The patient and I discussed the procedure of stereotactic-guided biopsy including benefits and alternatives. We discussed the high likelihood of a successful procedure. We discussed the risks of the procedure including infection, bleeding, tissue injury, clip migration, and inadequate  sampling. Informed written consent was given. The usual time out protocol was performed immediately prior to the procedure. SITE 1: SMALL ASYMMETRY WITH ARCHITECTURAL DISTORTION IN THE UPPER INNER QUADRANT OF THE RIGHT BREAST Using sterile technique and 1% Lidocaine as local anesthetic, under stereotactic guidance, a 9 gauge vacuum assisted device was used to perform core needle biopsy of the recently demonstrated small asymmetry is architectural distortion in the upper inner quadrant of the right breast using a medial approach. Lesion quadrant: Upper inner quadrant At the conclusion of the procedure, a coil shaped tissue marker clip was deployed into the biopsy cavity. Follow-up 2-view mammogram was performed and dictated separately. SITE 2: ARCHITECTURAL DISTORTION IN THE UPPER INNER QUADRANT OF THE RIGHT BREAST Using sterile technique and 1% Lidocaine as local anesthetic, under stereotactic guidance, a 9 gauge vacuum assisted device was used to perform core needle biopsy of the recently demonstrated architectural distortion anterior to a group of mammographically stable calcifications in the upper inner right breast using a medial approach. Lesion quadrant: Upper inner quadrant At the conclusion of the procedure, a X shaped tissue marker clip was deployed into the biopsy cavity. Follow-up 2-view mammogram was performed and dictated separately. IMPRESSION: Stereotactic-guided biopsy of a small asymmetry of architectural distortion in the upper inner quadrant of the right breast and an area of architectural distortion more anteriorly in the upper inner quadrant of the right breast adjacent to a mammographically stable  group of calcifications. No apparent complications. Electronically Signed: By: Claudie Revering M.D. On: 08/03/2017 12:49      IMPRESSION/PLAN:  Right breast cancer  It was a pleasure meeting the patient today. We discussed the risks, benefits, and side effects of radiotherapy. If she undergoes  lumpectomy, I recommend radiotherapy to the right breast to reduce her risk of locoregional recurrence by 2/3.  We discussed that radiation would take approximately 4 weeks to complete and that I would give the patient a few weeks to heal following surgery before starting treatment planning.  If chemotherapy were to be given, this would precede radiotherapy. We spoke about acute effects including skin irritation and fatigue as well as much less common late effects including internal organ injury or irritation. We spoke about the latest technology that is used to minimize the risk of late effects for patients undergoing radiotherapy to the breast or chest wall. No guarantees of treatment were given. The patient is enthusiastic about proceeding with treatment. I look forward to participating in the patient's care.  I will await her referral back to me for postoperative follow-up and eventual CT simulation/treatment planning.  MRI is pending. Surgical options will be available after MRI.   The patient is enthusiastic about breast conservation. We talked about the indications for post-mastectomy radiation which would be less likely, but indicated if the pathologic stage increased from what we anticipate.   Soft systolic murmur at the RUSB noted today - patient knows to mention this to her PCP at her check up next month.   __________________________________________   Eppie Gibson, MD  This document serves as a record of services personally performed by Eppie Gibson, MD. It was created on his behalf by Wilburn Mylar, a trained medical scribe. The creation of this record is based on the scribe's personal observations and the provider's statements to them. This document has been checked and approved by the attending provider.

## 2017-08-24 ENCOUNTER — Ambulatory Visit
Admission: RE | Admit: 2017-08-24 | Discharge: 2017-08-24 | Disposition: A | Discharge status: Home / Self Care | Payer: 59 | Source: Ambulatory Visit | Attending: General Surgery | Admitting: General Surgery

## 2017-08-24 ENCOUNTER — Encounter: Payer: Self-pay | Admitting: Radiation Oncology

## 2017-08-24 ENCOUNTER — Encounter: Payer: Self-pay | Admitting: General Practice

## 2017-08-24 DIAGNOSIS — C50211 Malignant neoplasm of upper-inner quadrant of right female breast: Secondary | ICD-10-CM

## 2017-08-24 MED ORDER — GADOBENATE DIMEGLUMINE 529 MG/ML IV SOLN
15.0000 mL | Freq: Once | INTRAVENOUS | Status: AC | PRN
  Administered 2017-08-24: 15 mL via INTRAVENOUS

## 2017-08-24 NOTE — Progress Notes (Signed)
[place] Psychosocial Distress Screening Clinical Social Work  Clinical Social Work was referred by distress screening protocol.  The patient scored a 7 on the Psychosocial Distress Thermometer which indicates moderate distress. Clinical Social Worker contacted patient by phone to assess for distress and other psychosocial needs. Unable to reach patient by phone, no answer, no VM.  ONCBCN DISTRESS SCREENING 08/23/2017  Screening Type Initial Screening  Distress experienced in past week (1-10) 7  Emotional problem type Adjusting to illness  Physical Problem type Pain;Sleep/insomnia    Clinical Social Worker follow up needed: Yes.    If yes, follow up plan: Attempt recontact at later date.   Beverely Pace, [place], LCSW Clinical Social Worker Phone:  [phone]

## 2017-08-25 ENCOUNTER — Telehealth: Payer: Self-pay | Admitting: *Deleted

## 2017-08-31 ENCOUNTER — Encounter: Payer: Self-pay | Admitting: General Practice

## 2017-08-31 NOTE — Progress Notes (Signed)
[place] Psychosocial Distress Screening Clinical Social Work  Clinical Social Work was referred by distress screening protocol.  The patient scored a 7 on the Psychosocial Distress Thermometer which indicates moderate distress. Clinical Social Worker contacted patient by phone to assess for distress and other psychosocial needs. Main concern is meeting w surgeon and getting surgery scheduled.  Not sure about what she will need until after this meeting.  Has good support from family, does not anticipate any issues w transportation.  Encouraged patient to reach out for support as needed, she has my contact information.    ONCBCN DISTRESS SCREENING 08/23/2017  Screening Type Initial Screening  Distress experienced in past week (1-10) 7  Emotional problem type Adjusting to illness  Physical Problem type Pain;Sleep/insomnia     Clinical Social Worker follow up needed: No.  If yes, follow up plan:  Beverely Pace, LCSW

## 2017-09-08 ENCOUNTER — Other Ambulatory Visit: Payer: Self-pay | Admitting: General Surgery

## 2017-09-08 DIAGNOSIS — C50811 Malignant neoplasm of overlapping sites of right female breast: Secondary | ICD-10-CM

## 2017-09-08 DIAGNOSIS — Z170 Estrogen receptor positive status [ER+]: Principal | ICD-10-CM

## 2017-09-16 ENCOUNTER — Telehealth: Payer: Self-pay | Admitting: *Deleted

## 2017-09-16 NOTE — Telephone Encounter (Signed)
Spoke to patient and she is having all her treatment in [place].  I have cancelled her appointments here and will let the team know.

## 2017-09-28 ENCOUNTER — Encounter: Payer: Self-pay | Admitting: Genetic Counselor

## 2017-09-28 ENCOUNTER — Other Ambulatory Visit: Payer: Self-pay

## 2018-06-15 ENCOUNTER — Encounter (HOSPITAL_COMMUNITY): Payer: Self-pay | Admitting: Emergency Medicine

## 2018-06-15 ENCOUNTER — Emergency Department (HOSPITAL_COMMUNITY)
Admission: EM | Admit: 2018-06-15 | Discharge: 2018-06-16 | Disposition: A | Discharge status: Home / Self Care | Payer: BLUE CROSS/BLUE SHIELD | Attending: Emergency Medicine | Admitting: Emergency Medicine

## 2018-06-15 ENCOUNTER — Other Ambulatory Visit: Payer: Self-pay

## 2018-06-15 ENCOUNTER — Emergency Department (HOSPITAL_COMMUNITY): Payer: BLUE CROSS/BLUE SHIELD

## 2018-06-15 DIAGNOSIS — I10 Essential (primary) hypertension: Secondary | ICD-10-CM | POA: Diagnosis not present

## 2018-06-15 DIAGNOSIS — Z79899 Other long term (current) drug therapy: Secondary | ICD-10-CM | POA: Diagnosis not present

## 2018-06-15 DIAGNOSIS — R05 Cough: Secondary | ICD-10-CM | POA: Insufficient documentation

## 2018-06-15 DIAGNOSIS — R300 Dysuria: Secondary | ICD-10-CM | POA: Diagnosis not present

## 2018-06-15 DIAGNOSIS — R059 Cough, unspecified: Secondary | ICD-10-CM

## 2018-06-15 DIAGNOSIS — N3001 Acute cystitis with hematuria: Secondary | ICD-10-CM | POA: Insufficient documentation

## 2018-06-15 DIAGNOSIS — Z853 Personal history of malignant neoplasm of breast: Secondary | ICD-10-CM | POA: Insufficient documentation

## 2018-06-15 DIAGNOSIS — Z85520 Personal history of malignant carcinoid tumor of kidney: Secondary | ICD-10-CM | POA: Diagnosis not present

## 2018-06-15 LAB — URINALYSIS, ROUTINE W REFLEX MICROSCOPIC
Bilirubin Urine: NEGATIVE
Glucose, UA: NEGATIVE mg/dL
Ketones, ur: NEGATIVE mg/dL
Nitrite: NEGATIVE
Protein, ur: NEGATIVE mg/dL
Specific Gravity, Urine: 1.011 (ref 1.005–1.030)
pH: 6 (ref 5.0–8.0)

## 2018-06-15 NOTE — ED Provider Notes (Signed)
[facility] EMERGENCY DEPARTMENT Provider Note   CSN: [id number] Arrival date & time: 06/15/18  2212    History   Chief Complaint Chief Complaint  Patient presents with  . Cough    HPI Jennifer Harvey is a 58 y.o. female.     Patient complains of a cough with yellow sputum production and she also complains of pain with urination.  The history is provided by the patient. No language interpreter was used.  Cough  Cough characteristics:  Productive Sputum characteristics:  Green Severity:  Moderate Onset quality:  Sudden Timing:  Constant Progression:  Resolved Chronicity:  New Smoker: no   Context: not animal exposure   Relieved by:  Nothing Worsened by:  Nothing Associated symptoms: no chest pain, no eye discharge, no headaches and no rash     Past Medical History:  Diagnosis Date  . Breast cancer ([place])   . Cancer [facility])    renal cell carcinoma 2007 s/p partial right nephrectomy   . Hypertension     Patient Active Problem List   Diagnosis Date Noted  . Malignant neoplasm of upper-inner quadrant of breast in female, estrogen receptor positive ([place]) 08/19/2017    Past Surgical History:  Procedure Laterality Date  . CESAREAN SECTION  1994     OB History   No obstetric history on file.      Home Medications    Prior to Admission medications   Medication Sig Start Date End Date Taking? Authorizing Provider  amLODipine (NORVASC) 5 MG tablet  07/29/17   [provider]  benazepril-hydrochlorthiazide (LOTENSIN HCT) 5-6.25 MG tablet Take 1 tablet by mouth daily.    [provider]  diphenhydrAMINE HCl, Sleep, (SLEEP AID) 25 MG CAPS Take by mouth.    [provider]  omeprazole (PRILOSEC) 10 MG capsule Take 10 mg by mouth daily.    [provider]    Family History Family History  Problem Relation Age of Onset  . Cancer Maternal Grandmother        stomach  . Cancer Maternal Grandfather        kidney cancer     Social History Social History   Tobacco Use  . Smoking status: Never Smoker  . Smokeless tobacco: Never Used  Substance Use Topics  . Alcohol use: Yes    Alcohol/week: 2.0 standard drinks    Types: 2 Cans of beer per week    Comment: 2 beers per day  . Drug use: Not Currently     Allergies   Patient has no known allergies.   Review of Systems Review of Systems  Constitutional: Negative for appetite change and fatigue.  HENT: Negative for congestion, ear discharge and sinus pressure.   Eyes: Negative for discharge.  Respiratory: Positive for cough.   Cardiovascular: Negative for chest pain.  Gastrointestinal: Negative for abdominal pain and diarrhea.  Genitourinary: Positive for dysuria. Negative for frequency and hematuria.  Musculoskeletal: Negative for back pain.  Skin: Negative for rash.  Neurological: Negative for seizures and headaches.  Psychiatric/Behavioral: Negative for hallucinations.     Physical Exam Updated Vital Signs BP (!) 144/84 (BP Location: Right Arm)   Pulse 72   Temp 98.8 F (37.1 C) (Oral)   Resp 19   Ht 5\' 6"  (1.676 m)   Wt 83.5 kg   SpO2 96%   BMI 29.70 kg/m   Physical Exam Vitals signs and nursing note reviewed.  Constitutional:      Appearance: She is well-developed.  HENT:     Head: Normocephalic.     Nose: Nose normal.  Eyes:     General: No scleral icterus.    Conjunctiva/sclera: Conjunctivae normal.  Neck:     Musculoskeletal: Neck supple.     Thyroid: No thyromegaly.  Cardiovascular:     Rate and Rhythm: Normal rate and regular rhythm.     Heart sounds: No murmur. No friction rub. No gallop.   Pulmonary:     Breath sounds: No stridor. No wheezing or rales.  Chest:     Chest wall: No tenderness.  Abdominal:     General: There is no distension.     Tenderness: There is no abdominal tenderness. There is no rebound.  Musculoskeletal: Normal range of motion.  Lymphadenopathy:     Cervical: No cervical adenopathy.   Skin:    Findings: No erythema or rash.  Neurological:     Mental Status: She is alert and oriented to person, place, and time.     Motor: No abnormal muscle tone.     Coordination: Coordination normal.  Psychiatric:        Behavior: Behavior normal.      ED Treatments / Results  Labs (all labs ordered are listed, but only abnormal results are displayed) Labs Reviewed  URINALYSIS, ROUTINE W REFLEX MICROSCOPIC    EKG None  Radiology Dg Chest Portable 1 View  Result Date: 06/15/2018 CLINICAL DATA:  Cough EXAM: PORTABLE CHEST 1 VIEW COMPARISON:  11/30/2005 FINDINGS: Lungs are clear.  No pleural effusion or pneumothorax. The heart is top-normal in size. IMPRESSION: No evidence of acute cardiopulmonary disease. Electronically Signed   By: Julian Hy M.D.   On: 06/15/2018 22:51    Procedures Procedures (including critical care time)  Medications Ordered in ED Medications - No data to display   Initial Impression / Assessment and Plan / ED Course  I have reviewed the triage vital signs and the nursing notes.  Pertinent labs & imaging results that were available during my care of the patient were reviewed by me and considered in my medical decision making (see chart for details).   Patient has a urinary tract infection and will be placed on Levaquin.  Patient also has had persistent cough and will be placed on covif quarantine  at home       Final Clinical Impressions(s) / ED Diagnoses   Final diagnoses:  None    ED Discharge Orders    None       Milton Ferguson, MD 06/16/18 0002

## 2018-06-15 NOTE — ED Triage Notes (Signed)
Pt C/o cough that began 6 days ago. Pt also C/O burning with urination. Pt unsure of fevers at home stating "sometimes I get hot and then I get cold."

## 2018-06-16 MED ORDER — LEVOFLOXACIN 500 MG PO TABS: 500.0000 mg | ORAL_TABLET | Freq: Every day | ORAL | 0 refills | Status: DC

## 2018-06-16 NOTE — Discharge Instructions (Addendum)
Drink plenty of fluids.  He will be placed on an antibiotic that covers urinary tract infection and respiratory infection.  He will need to quarantine herself and follow-up with your doctor next week for recheck of your urine.       Person Under Monitoring Name: Jennifer Harvey [facility]  Location: [address] [place] [address]   Infection Prevention Recommendations for Individuals Confirmed to have, or Being Evaluated for, 2019 Novel Coronavirus (COVID-19) Infection Who Receive Care at Home  Individuals who are confirmed to have, or are being evaluated for, COVID-19 should follow the prevention steps below until a healthcare provider or local or state health department says they can return to normal activities.  Stay home except to get medical care You should restrict activities outside your home, except for getting medical care. Do not go to work, school, or public areas, and do not use public transportation or taxis.  Call ahead before visiting your doctor Before your medical appointment, call the healthcare provider and tell them that you have, or are being evaluated for, COVID-19 infection. This will help the healthcare providers office take steps to keep other people from getting infected. Ask your healthcare provider to call the local or state health department.  Monitor your symptoms Seek prompt medical attention if your illness is worsening (e.g., difficulty breathing). Before going to your medical appointment, call the healthcare provider and tell them that you have, or are being evaluated for, COVID-19 infection. Ask your healthcare provider to call the local or state health department.  Wear a facemask You should wear a facemask that covers your nose and mouth when you are in the same room with other people and when you visit a healthcare provider. People who live with or visit you should also wear a facemask while they are in the same room with  you.  Separate yourself from other people in your home As much as possible, you should stay in a different room from other people in your home. Also, you should use a separate bathroom, if available.  Avoid sharing household items You should not share dishes, drinking glasses, cups, eating utensils, towels, bedding, or other items with other people in your home. After using these items, you should wash them thoroughly with soap and water.  Cover your coughs and sneezes Cover your mouth and nose with a tissue when you cough or sneeze, or you can cough or sneeze into your sleeve. Throw used tissues in a lined trash can, and immediately wash your hands with soap and water for at least 20 seconds or use an alcohol-based hand rub.  Wash your [facility] your hands often and thoroughly with soap and water for at least 20 seconds. You can use an alcohol-based hand sanitizer if soap and water are not available and if your hands are not visibly dirty. Avoid touching your eyes, nose, and mouth with unwashed hands.   Prevention Steps for Caregivers and Household Members of Individuals Confirmed to have, or Being Evaluated for, COVID-19 Infection Being Cared for in the Home  If you live with, or provide care at home for, a person confirmed to have, or being evaluated for, COVID-19 infection please follow these guidelines to prevent infection:  Follow healthcare providers instructions Make sure that you understand and can help the patient follow any healthcare provider instructions for all care.  Provide for the patients basic needs You should help the patient with basic needs in the home and provide support  for getting groceries, prescriptions, and other personal needs.  Monitor the patients symptoms If they are getting sicker, call his or her medical provider and tell them that the patient has, or is being evaluated for, COVID-19 infection. This will help the healthcare providers office  take steps to keep other people from getting infected. Ask the healthcare provider to call the local or state health department.  Limit the number of people who have contact with the patient If possible, have only one caregiver for the patient. Other household members should stay in another home or place of residence. If this is not possible, they should stay in another room, or be separated from the patient as much as possible. Use a separate bathroom, if available. Restrict visitors who do not have an essential need to be in the home.  Keep older adults, very young children, and other sick people away from the patient Keep older adults, very young children, and those who have compromised immune systems or chronic health conditions away from the patient. This includes people with chronic heart, lung, or kidney conditions, diabetes, and cancer.  Ensure good ventilation Make sure that shared spaces in the home have good air flow, such as from an air conditioner or an opened window, weather permitting.  Wash your hands often Wash your hands often and thoroughly with soap and water for at least 20 seconds. You can use an alcohol based hand sanitizer if soap and water are not available and if your hands are not visibly dirty. Avoid touching your eyes, nose, and mouth with unwashed hands. Use disposable paper towels to dry your hands. If not available, use dedicated cloth towels and replace them when they become wet.  Wear a facemask and gloves Wear a disposable facemask at all times in the room and gloves when you touch or have contact with the patients blood, body fluids, and/or secretions or excretions, such as sweat, saliva, sputum, nasal mucus, vomit, urine, or feces.  Ensure the mask fits over your nose and mouth tightly, and do not touch it during use. Throw out disposable facemasks and gloves after using them. Do not reuse. Wash your hands immediately after removing your facemask and  gloves. If your personal clothing becomes contaminated, carefully remove clothing and launder. Wash your hands after handling contaminated clothing. Place all used disposable facemasks, gloves, and other waste in a lined container before disposing them with other household waste. Remove gloves and wash your hands immediately after handling these items.  Do not share dishes, glasses, or other household items with the patient Avoid sharing household items. You should not share dishes, drinking glasses, cups, eating utensils, towels, bedding, or other items with a patient who is confirmed to have, or being evaluated for, COVID-19 infection. After the person uses these items, you should wash them thoroughly with soap and water.  Wash laundry thoroughly Immediately remove and wash clothes or bedding that have blood, body fluids, and/or secretions or excretions, such as sweat, saliva, sputum, nasal mucus, vomit, urine, or feces, on them. Wear gloves when handling laundry from the patient. Read and follow directions on labels of laundry or clothing items and detergent. In general, wash and dry with the warmest temperatures recommended on the label.  Clean all areas the individual has used often Clean all touchable surfaces, such as counters, tabletops, doorknobs, bathroom fixtures, toilets, phones, keyboards, tablets, and bedside tables, every day. Also, clean any surfaces that may have blood, body fluids, and/or secretions or excretions on  them. Wear gloves when cleaning surfaces the patient has come in contact with. Use a diluted bleach solution (e.g., dilute bleach with 1 part bleach and 10 parts water) or a household disinfectant with a label that says EPA-registered for coronaviruses. To make a bleach solution at home, add 1 tablespoon of bleach to 1 quart (4 cups) of water. For a larger supply, add  cup of bleach to 1 gallon (16 cups) of water. Read labels of cleaning products and follow  recommendations provided on product labels. Labels contain instructions for safe and effective use of the cleaning product including precautions you should take when applying the product, such as wearing gloves or eye protection and making sure you have good ventilation during use of the product. Remove gloves and wash hands immediately after cleaning.  Monitor yourself for signs and symptoms of illness Caregivers and household members are considered close contacts, should monitor their health, and will be asked to limit movement outside of the home to the extent possible. Follow the monitoring steps for close contacts listed on the symptom monitoring form.   ? If you have additional questions, contact your local health department or call the epidemiologist on call at [phone] (available 24/7). ? This guidance is subject to change. For the most up-to-date guidance from [facility], please refer to their website: [email or web address]

## 2018-06-17 LAB — URINE CULTURE: Culture: NO GROWTH

## 2018-09-05 ENCOUNTER — Other Ambulatory Visit: Payer: BLUE CROSS/BLUE SHIELD

## 2018-09-05 ENCOUNTER — Other Ambulatory Visit: Payer: Self-pay

## 2018-09-05 DIAGNOSIS — Z20822 Contact with and (suspected) exposure to covid-19: Secondary | ICD-10-CM

## 2018-09-05 DIAGNOSIS — R6889 Other general symptoms and signs: Secondary | ICD-10-CM | POA: Diagnosis not present

## 2018-09-07 LAB — NOVEL CORONAVIRUS, NAA: SARS-CoV-2, NAA: NOT DETECTED

## 2018-09-11 ENCOUNTER — Telehealth: Payer: Self-pay | Admitting: Hematology

## 2018-09-11 NOTE — Telephone Encounter (Signed)
Pt is calling and requesting covid 19 results. Pt is aware results are negative

## 2018-12-17 ENCOUNTER — Emergency Department (HOSPITAL_COMMUNITY)
Admission: EM | Admit: 2018-12-17 | Discharge: 2018-12-17 | Disposition: A | Discharge status: Home / Self Care | Payer: BC Managed Care – PPO | Attending: Emergency Medicine | Admitting: Emergency Medicine

## 2018-12-17 ENCOUNTER — Emergency Department (HOSPITAL_COMMUNITY): Payer: BC Managed Care – PPO

## 2018-12-17 ENCOUNTER — Encounter (HOSPITAL_COMMUNITY): Payer: Self-pay | Admitting: *Deleted

## 2018-12-17 ENCOUNTER — Other Ambulatory Visit: Payer: Self-pay

## 2018-12-17 DIAGNOSIS — Z79899 Other long term (current) drug therapy: Secondary | ICD-10-CM | POA: Diagnosis not present

## 2018-12-17 DIAGNOSIS — M7732 Calcaneal spur, left foot: Secondary | ICD-10-CM | POA: Diagnosis not present

## 2018-12-17 DIAGNOSIS — Z85528 Personal history of other malignant neoplasm of kidney: Secondary | ICD-10-CM | POA: Insufficient documentation

## 2018-12-17 DIAGNOSIS — M79672 Pain in left foot: Secondary | ICD-10-CM | POA: Insufficient documentation

## 2018-12-17 DIAGNOSIS — I10 Essential (primary) hypertension: Secondary | ICD-10-CM | POA: Insufficient documentation

## 2018-12-17 DIAGNOSIS — Z853 Personal history of malignant neoplasm of breast: Secondary | ICD-10-CM | POA: Insufficient documentation

## 2018-12-17 DIAGNOSIS — N3000 Acute cystitis without hematuria: Secondary | ICD-10-CM | POA: Insufficient documentation

## 2018-12-17 HISTORY — DX: Personal history of other malignant neoplasm of kidney: Z85.528

## 2018-12-17 LAB — URINALYSIS, ROUTINE W REFLEX MICROSCOPIC
Bilirubin Urine: NEGATIVE
Glucose, UA: NEGATIVE mg/dL
Ketones, ur: NEGATIVE mg/dL
Nitrite: NEGATIVE
Protein, ur: NEGATIVE mg/dL
Specific Gravity, Urine: 1.015 (ref 1.005–1.030)
pH: 6 (ref 5.0–8.0)

## 2018-12-17 MED ORDER — CEPHALEXIN 500 MG PO CAPS
500.0000 mg | ORAL_CAPSULE | Freq: Once | ORAL | Status: AC
  Administered 2018-12-17: 02:00:00 500 mg via ORAL
  Filled 2018-12-17: qty 1

## 2018-12-17 MED ORDER — IBUPROFEN 800 MG PO TABS: 800.0000 mg | ORAL_TABLET | Freq: Four times a day (QID) | ORAL | 0 refills | Status: DC | PRN

## 2018-12-17 MED ORDER — CEPHALEXIN 500 MG PO CAPS: 500.0000 mg | ORAL_CAPSULE | Freq: Two times a day (BID) | ORAL | 0 refills | Status: DC

## 2018-12-17 MED ORDER — PHENAZOPYRIDINE HCL 200 MG PO TABS: 200.0000 mg | ORAL_TABLET | Freq: Three times a day (TID) | ORAL | 0 refills | Status: DC | PRN

## 2018-12-17 NOTE — ED Triage Notes (Signed)
Pt c/o left heel pain that radiates to vaginal area that started 3 days ago, denies any injury,

## 2018-12-17 NOTE — ED Provider Notes (Signed)
[facility] EMERGENCY DEPARTMENT Provider Note   CSN: [id number] Arrival date & time: 12/17/18  0000     History   Chief Complaint Chief Complaint  Patient presents with   Foot Pain    HPI Jennifer Harvey is a 58 y.o. female.     Patient presents to the emergency department for evaluation of left heel pain and vaginal pain.  Patient reports that she first started to notice these pains approximately 3 days ago.  She reports a pain on the bottom portion of her heel that worsens when she walks.  She denies any injury.  She has been taking extra strength Tylenol without improvement.  Around the same time she started noticing pain in the area of her vagina.  She reports that this is a constant aching that is not alleviated or exacerbated by anything.  She has not had any vaginal discharge or bleeding.  She denies urinary symptoms including dysuria, hematuria, urinary frequency.     Past Medical History:  Diagnosis Date   Breast cancer ([place])    Cancer ([place])    renal cell carcinoma 2007 s/p partial right nephrectomy    History of kidney cancer    Hypertension     Patient Active Problem List   Diagnosis Date Noted   Malignant neoplasm of upper-inner quadrant of breast in female, estrogen receptor positive ([place]) 08/19/2017    Past Surgical History:  Procedure Laterality Date   CESAREAN SECTION  1994     OB History   No obstetric history on file.      Home Medications    Prior to Admission medications   Medication Sig Start Date End Date Taking? Authorizing Provider  amLODipine (NORVASC) 5 MG tablet  07/29/17   [provider]  benazepril-hydrochlorthiazide (LOTENSIN HCT) 5-6.25 MG tablet Take 1 tablet by mouth daily.    [provider]  cephALEXin (KEFLEX) 500 MG capsule Take 1 capsule (500 mg total) by mouth 2 (two) times daily. 12/17/18   Orpah Greek, MD  diphenhydrAMINE HCl, Sleep, (SLEEP AID) 25 MG CAPS Take by mouth.     [provider]  ibuprofen (ADVIL) 800 MG tablet Take 1 tablet (800 mg total) by mouth every 6 (six) hours as needed for moderate pain. 12/17/18   Orpah Greek, MD  levofloxacin (LEVAQUIN) 500 MG tablet Take 1 tablet (500 mg total) by mouth daily. 06/16/18   Milton Ferguson, MD  omeprazole (PRILOSEC) 10 MG capsule Take 10 mg by mouth daily.    [provider]  phenazopyridine (PYRIDIUM) 200 MG tablet Take 1 tablet (200 mg total) by mouth 3 (three) times daily as needed for pain. 12/17/18   Orpah Greek, MD    Family History Family History  Problem Relation Age of Onset   Cancer Maternal Grandmother        stomach   Cancer Maternal Grandfather        kidney cancer    Social History Social History   Tobacco Use   Smoking status: Never Smoker   Smokeless tobacco: Never Used  Substance Use Topics   Alcohol use: Yes    Alcohol/week: 2.0 standard drinks    Types: 2 Cans of beer per week    Comment: 2 beers per day   Drug use: Not Currently     Allergies   Patient has no known allergies.   Review of Systems Review of Systems  Genitourinary: Positive for vaginal pain.  Musculoskeletal: Positive for arthralgias.  All other systems reviewed and are negative.    Physical Exam Updated Vital Signs BP 131/64    Pulse 73    Temp 97.8 F (36.6 C) (Oral)    Resp 16    Ht 5\' 6"  (1.676 m)    Wt 88 kg    SpO2 98%    BMI 31.31 kg/m   Physical Exam Vitals signs and nursing note reviewed.  Constitutional:      General: She is not in acute distress.    Appearance: Normal appearance. She is well-developed.  HENT:     Head: Normocephalic and atraumatic.     Right Ear: Hearing normal.     Left Ear: Hearing normal.     Nose: Nose normal.  Eyes:     Conjunctiva/sclera: Conjunctivae normal.     Pupils: Pupils are equal, round, and reactive to light.  Neck:     Musculoskeletal: Normal range of motion and neck supple.  Cardiovascular:     Rate  and Rhythm: Regular rhythm.     Pulses:          Dorsalis pedis pulses are 2+ on the right side and 2+ on the left side.     Heart sounds: S1 normal and S2 normal. No murmur. No friction rub. No gallop.   Pulmonary:     Effort: Pulmonary effort is normal. No respiratory distress.     Breath sounds: Normal breath sounds.  Chest:     Chest wall: No tenderness.  Abdominal:     General: Bowel sounds are normal.     Palpations: Abdomen is soft.     Tenderness: There is no abdominal tenderness. There is no guarding or rebound. Negative signs include Murphy's sign and McBurney's sign.     Hernia: No hernia is present.  Musculoskeletal: Normal range of motion.  Skin:    General: Skin is warm and dry.     Findings: No rash.  Neurological:     Mental Status: She is alert and oriented to person, place, and time.     GCS: GCS eye subscore is 4. GCS verbal subscore is 5. GCS motor subscore is 6.     Cranial Nerves: No cranial nerve deficit.     Sensory: No sensory deficit.     Coordination: Coordination normal.  Psychiatric:        Speech: Speech normal.        Behavior: Behavior normal.        Thought Content: Thought content normal.      ED Treatments / Results  Labs (all labs ordered are listed, but only abnormal results are displayed) Labs Reviewed  URINALYSIS, ROUTINE W REFLEX MICROSCOPIC - Abnormal; Notable for the following components:      Result Value   APPearance HAZY (*)    Hgb urine dipstick MODERATE (*)    Leukocytes,Ua SMALL (*)    Bacteria, UA FEW (*)    All other components within normal limits    EKG None  Radiology Dg Foot Complete Left  Result Date: 12/17/2018 CLINICAL DATA:  Foot pain EXAM: LEFT FOOT - COMPLETE 3+ VIEW COMPARISON:  None. FINDINGS: No fracture or malalignment. Moderate plantar calcaneal spur and posterior enthesophyte. IMPRESSION: 1. No acute osseous abnormality 2. Moderate heel spur Electronically Signed   By: Donavan Foil M.D.   On:  12/17/2018 02:08    Procedures Procedures (including critical care time)  Medications Ordered in ED Medications - No data to display   Initial Impression /  Assessment and Plan / ED Course  I have reviewed the triage vital signs and the nursing notes.  Pertinent labs & imaging results that were available during my care of the patient were reviewed by me and considered in my medical decision making (see chart for details).        Patient presents to the ER with multiple complaints.  Patient complaining of left heel pain.  Pain is been ongoing for several days.  She reports increased pain when she walks.  No significant swelling on exam.  No overlying redness or signs of infection.  She has normal dorsalis pedal pulse, sensation and strength in the lower extremity.  No calf tenderness.  No calf swelling to suggest DVT.  Normal Thompson test.  X-ray unremarkable.  This was performed because of multiple cancers, no obvious lytic lesions.  Treat for inflammatory cause of heel pain with rest, ice, NSAIDs.  Patient also complaining of a discomfort in the area of her vagina.  She has not had any obvious urinary symptoms.  No other symptoms to suggest STD.  Urinalysis is grossly abnormal, will treat for UTI.  Final Clinical Impressions(s) / ED Diagnoses   Final diagnoses:  Pain of left heel  Acute cystitis without hematuria    ED Discharge Orders         Ordered    cephALEXin (KEFLEX) 500 MG capsule  2 times daily     12/17/18 0221    phenazopyridine (PYRIDIUM) 200 MG tablet  3 times daily PRN     12/17/18 0221    ibuprofen (ADVIL) 800 MG tablet  Every 6 hours PRN     12/17/18 0221           Orpah Greek, MD 12/17/18 0221

## 2018-12-18 LAB — URINE CULTURE: Culture: NO GROWTH

## 2018-12-28 DIAGNOSIS — M7732 Calcaneal spur, left foot: Secondary | ICD-10-CM | POA: Diagnosis not present

## 2018-12-28 DIAGNOSIS — M79672 Pain in left foot: Secondary | ICD-10-CM | POA: Diagnosis not present

## 2018-12-28 DIAGNOSIS — M722 Plantar fascial fibromatosis: Secondary | ICD-10-CM | POA: Diagnosis not present

## 2019-01-18 DIAGNOSIS — M722 Plantar fascial fibromatosis: Secondary | ICD-10-CM | POA: Diagnosis not present

## 2019-01-18 DIAGNOSIS — M79672 Pain in left foot: Secondary | ICD-10-CM | POA: Diagnosis not present

## 2019-01-18 DIAGNOSIS — M7732 Calcaneal spur, left foot: Secondary | ICD-10-CM | POA: Diagnosis not present

## 2019-07-02 DIAGNOSIS — G4733 Obstructive sleep apnea (adult) (pediatric): Secondary | ICD-10-CM | POA: Diagnosis not present

## 2019-07-13 DIAGNOSIS — Z85528 Personal history of other malignant neoplasm of kidney: Secondary | ICD-10-CM | POA: Diagnosis not present

## 2019-07-13 DIAGNOSIS — D4101 Neoplasm of uncertain behavior of right kidney: Secondary | ICD-10-CM | POA: Diagnosis not present

## 2019-07-26 DIAGNOSIS — D4101 Neoplasm of uncertain behavior of right kidney: Secondary | ICD-10-CM | POA: Diagnosis not present

## 2019-07-31 DIAGNOSIS — N281 Cyst of kidney, acquired: Secondary | ICD-10-CM | POA: Diagnosis not present

## 2019-07-31 DIAGNOSIS — Z85528 Personal history of other malignant neoplasm of kidney: Secondary | ICD-10-CM | POA: Diagnosis not present

## 2019-08-01 DIAGNOSIS — G4733 Obstructive sleep apnea (adult) (pediatric): Secondary | ICD-10-CM | POA: Diagnosis not present

## 2019-08-16 ENCOUNTER — Other Ambulatory Visit: Payer: Self-pay

## 2019-08-16 DIAGNOSIS — F1099 Alcohol use, unspecified with unspecified alcohol-induced disorder: Secondary | ICD-10-CM | POA: Insufficient documentation

## 2019-08-16 DIAGNOSIS — R05 Cough: Secondary | ICD-10-CM | POA: Diagnosis not present

## 2019-08-16 DIAGNOSIS — Z853 Personal history of malignant neoplasm of breast: Secondary | ICD-10-CM | POA: Insufficient documentation

## 2019-08-16 DIAGNOSIS — R509 Fever, unspecified: Secondary | ICD-10-CM | POA: Insufficient documentation

## 2019-08-16 DIAGNOSIS — Z79899 Other long term (current) drug therapy: Secondary | ICD-10-CM | POA: Diagnosis not present

## 2019-08-16 DIAGNOSIS — R519 Headache, unspecified: Secondary | ICD-10-CM | POA: Insufficient documentation

## 2019-08-16 DIAGNOSIS — I10 Essential (primary) hypertension: Secondary | ICD-10-CM | POA: Diagnosis not present

## 2019-08-16 DIAGNOSIS — Z85528 Personal history of other malignant neoplasm of kidney: Secondary | ICD-10-CM | POA: Diagnosis not present

## 2019-08-16 DIAGNOSIS — Z170 Estrogen receptor positive status [ER+]: Secondary | ICD-10-CM | POA: Insufficient documentation

## 2019-08-16 DIAGNOSIS — R1084 Generalized abdominal pain: Secondary | ICD-10-CM | POA: Diagnosis not present

## 2019-08-16 DIAGNOSIS — Z20822 Contact with and (suspected) exposure to covid-19: Secondary | ICD-10-CM | POA: Insufficient documentation

## 2019-08-16 DIAGNOSIS — J00 Acute nasopharyngitis [common cold]: Secondary | ICD-10-CM | POA: Diagnosis not present

## 2019-08-17 ENCOUNTER — Other Ambulatory Visit: Payer: Self-pay

## 2019-08-17 ENCOUNTER — Encounter (HOSPITAL_COMMUNITY): Payer: Self-pay | Admitting: Emergency Medicine

## 2019-08-17 ENCOUNTER — Emergency Department (HOSPITAL_COMMUNITY)
Admission: EM | Admit: 2019-08-17 | Discharge: 2019-08-17 | Disposition: A | Discharge status: Home / Self Care | Payer: BC Managed Care – PPO | Attending: Emergency Medicine | Admitting: Emergency Medicine

## 2019-08-17 ENCOUNTER — Emergency Department (HOSPITAL_COMMUNITY): Payer: BC Managed Care – PPO

## 2019-08-17 DIAGNOSIS — R1084 Generalized abdominal pain: Secondary | ICD-10-CM

## 2019-08-17 DIAGNOSIS — R509 Fever, unspecified: Secondary | ICD-10-CM | POA: Diagnosis not present

## 2019-08-17 DIAGNOSIS — R05 Cough: Secondary | ICD-10-CM | POA: Diagnosis not present

## 2019-08-17 DIAGNOSIS — J00 Acute nasopharyngitis [common cold]: Secondary | ICD-10-CM | POA: Diagnosis not present

## 2019-08-17 DIAGNOSIS — Z20822 Contact with and (suspected) exposure to covid-19: Secondary | ICD-10-CM | POA: Diagnosis not present

## 2019-08-17 LAB — CBC
HCT: 35.3 % — ABNORMAL LOW (ref 36.0–46.0)
Hemoglobin: 11.7 g/dL — ABNORMAL LOW (ref 12.0–15.0)
MCH: 29.6 pg (ref 26.0–34.0)
MCHC: 33.1 g/dL (ref 30.0–36.0)
MCV: 89.4 fL (ref 80.0–100.0)
Platelets: 191 10*3/uL (ref 150–400)
RBC: 3.95 MIL/uL (ref 3.87–5.11)
RDW: 13.9 % (ref 11.5–15.5)
WBC: 9.7 10*3/uL (ref 4.0–10.5)
nRBC: 0 % (ref 0.0–0.2)

## 2019-08-17 LAB — URINALYSIS, ROUTINE W REFLEX MICROSCOPIC
Bilirubin Urine: NEGATIVE
Glucose, UA: NEGATIVE mg/dL
Ketones, ur: NEGATIVE mg/dL
Leukocytes,Ua: NEGATIVE
Nitrite: NEGATIVE
Protein, ur: NEGATIVE mg/dL
Specific Gravity, Urine: 1.011 (ref 1.005–1.030)
pH: 7 (ref 5.0–8.0)

## 2019-08-17 LAB — COMPREHENSIVE METABOLIC PANEL
ALT: 22 U/L (ref 0–44)
AST: 24 U/L (ref 15–41)
Albumin: 3.9 g/dL (ref 3.5–5.0)
Alkaline Phosphatase: 70 U/L (ref 38–126)
Anion gap: 8 (ref 5–15)
BUN: 10 mg/dL (ref 6–20)
CO2: 25 mmol/L (ref 22–32)
Calcium: 8.7 mg/dL — ABNORMAL LOW (ref 8.9–10.3)
Chloride: 103 mmol/L (ref 98–111)
Creatinine, Ser: 0.62 mg/dL (ref 0.44–1.00)
GFR calc Af Amer: >60 mL/min (ref >60)
GFR calc non Af Amer: >60 mL/min (ref >60)
Glucose, Bld: 125 mg/dL — ABNORMAL HIGH (ref 70–99)
Potassium: 3.7 mmol/L (ref 3.5–5.1)
Sodium: 136 mmol/L (ref 135–145)
Total Bilirubin: 0.5 mg/dL (ref 0.3–1.2)
Total Protein: 7 g/dL (ref 6.5–8.1)

## 2019-08-17 LAB — SARS CORONAVIRUS 2 BY RT PCR (HOSPITAL ORDER, PERFORMED IN ~~LOC~~ HOSPITAL LAB): SARS Coronavirus 2: NEGATIVE

## 2019-08-17 LAB — POC SARS CORONAVIRUS 2 AG -  ED: SARS Coronavirus 2 Ag: NEGATIVE

## 2019-08-17 LAB — LIPASE, BLOOD: Lipase: 24 U/L (ref 11–51)

## 2019-08-17 MED ORDER — OXYCODONE-ACETAMINOPHEN 5-325 MG PO TABS: 2.0000 | ORAL_TABLET | Freq: Once | ORAL | Status: DC

## 2019-08-17 MED ORDER — ACETAMINOPHEN 325 MG PO TABS
650.0000 mg | ORAL_TABLET | Freq: Once | ORAL | Status: AC
  Administered 2019-08-17: 650 mg via ORAL
  Filled 2019-08-17: qty 2

## 2019-08-17 MED ORDER — SODIUM CHLORIDE 0.9 % IV BOLUS (SEPSIS)
1000.0000 mL | Freq: Once | INTRAVENOUS | Status: AC
  Administered 2019-08-17: 1000 mL via INTRAVENOUS

## 2019-08-17 MED ORDER — FENTANYL CITRATE (PF) 100 MCG/2ML IJ SOLN
100.0000 ug | Freq: Once | INTRAMUSCULAR | Status: AC
  Administered 2019-08-17: 100 ug via INTRAVENOUS
  Filled 2019-08-17: qty 2

## 2019-08-17 NOTE — ED Triage Notes (Signed)
Pt c/o abd pain, chills, headache since that started about 6am yesterday morning.

## 2019-08-17 NOTE — ED Provider Notes (Signed)
[facility] EMERGENCY DEPARTMENT Provider Note   CSN: [id number] Arrival date & time: 08/16/19  2333     History Chief Complaint  Patient presents with   Abdominal Pain    Jennifer Harvey is a 59 y.o. female.  The history is provided by the patient.  Abdominal Pain Pain location:  Generalized Pain quality: aching   Pain radiates to:  Back Pain severity:  Mild Onset quality:  Gradual Duration:  1 day Timing:  Constant Progression:  Unchanged Chronicity:  New Relieved by:  None tried Worsened by:  Nothing Associated symptoms: chills, cough and fever   Associated symptoms: no diarrhea, no dysuria, no shortness of breath, no vaginal bleeding, no vaginal discharge and no vomiting   Risk factors: multiple surgeries   Patient with history of breast cancer, renal cell carcinoma s/p right partial nephrectomy presents with fever and abdominal pain.  She reports over the past day she had generalized abdominal pain.  She did begin having fevers and chills.  No vomiting or diarrhea.  She has had mild cough and headache.     Past Medical History:  Diagnosis Date   Breast cancer ([place])    Cancer ([place])    renal cell carcinoma 2007 s/p partial right nephrectomy    History of kidney cancer    Hypertension     Patient Active Problem List   Diagnosis Date Noted   Malignant neoplasm of upper-inner quadrant of breast in female, estrogen receptor positive ([place]) 08/19/2017    Past Surgical History:  Procedure Laterality Date   CESAREAN SECTION  1994     OB History   No obstetric history on file.     Family History  Problem Relation Age of Onset   Cancer Maternal Grandmother        stomach   Cancer Maternal Grandfather        kidney cancer    Social History   Tobacco Use   Smoking status: Never Smoker   Smokeless tobacco: Never Used  Substance Use Topics   Alcohol use: Yes    Alcohol/week: 2.0 standard drinks    Types: 2 Cans of beer per week   Comment: 2 beers per day   Drug use: Not Currently    Home Medications Prior to Admission medications   Medication Sig Start Date End Date Taking? Authorizing Provider  amLODipine (NORVASC) 5 MG tablet  07/29/17   [provider]  benazepril-hydrochlorthiazide (LOTENSIN HCT) 5-6.25 MG tablet Take 1 tablet by mouth daily.    [provider]  diphenhydrAMINE HCl, Sleep, (SLEEP AID) 25 MG CAPS Take by mouth.    [provider]  ibuprofen (ADVIL) 800 MG tablet Take 1 tablet (800 mg total) by mouth every 6 (six) hours as needed for moderate pain. 12/17/18   Orpah Greek, MD  omeprazole (PRILOSEC) 10 MG capsule Take 10 mg by mouth daily.    [provider]    Allergies    Patient has no known allergies.  Review of Systems   Review of Systems  Constitutional: Positive for chills and fever.  Respiratory: Positive for cough. Negative for shortness of breath.   Gastrointestinal: Positive for abdominal pain. Negative for diarrhea and vomiting.  Genitourinary: Positive for frequency. Negative for dysuria, vaginal bleeding and vaginal discharge.  Neurological: Positive for headaches.  All other systems reviewed and are negative.   Physical Exam Updated Vital Signs BP 122/75 (BP Location: Right Arm)    Pulse 84    Temp Marland Kitchen)  101.1 F (38.4 C) (Oral)    Resp 16    Ht 1.6 m (5\' 3" )    Wt 86.6 kg    SpO2 95%    BMI 33.83 kg/m   Physical Exam CONSTITUTIONAL: Well developed/well nourished HEAD: Normocephalic/atraumatic EYES: EOMI/PERRL ENMT: Mucous membranes moist NECK: supple no meningeal signs SPINE/BACK:entire spine nontender CV: S1/S2 noted, no murmurs/rubs/gallops noted LUNGS: Lungs are clear to auscultation bilaterally, no apparent distress ABDOMEN: soft, mild central abdominal tenderness, no rebound or guarding, bowel sounds noted throughout abdomen GU:no cva tenderness NEURO: Pt is awake/alert/appropriate, moves all extremitiesx4.  No facial  droop.  EXTREMITIES: pulses normal/equal, full ROM SKIN: warm, color normal PSYCH: no abnormalities of mood noted, alert and oriented to situation  ED Results / Procedures / Treatments   Labs (all labs ordered are listed, but only abnormal results are displayed) Labs Reviewed  COMPREHENSIVE METABOLIC PANEL - Abnormal; Notable for the following components:      Result Value   Glucose, Bld 125 (*)    Calcium 8.7 (*)    All other components within normal limits  CBC - Abnormal; Notable for the following components:   Hemoglobin 11.7 (*)    HCT 35.3 (*)    All other components within normal limits  URINALYSIS, ROUTINE W REFLEX MICROSCOPIC - Abnormal; Notable for the following components:   Hgb urine dipstick MODERATE (*)    Bacteria, UA RARE (*)    All other components within normal limits  SARS CORONAVIRUS 2 BY RT PCR (HOSPITAL ORDER, [facility])  LIPASE, BLOOD  POC SARS CORONAVIRUS 2 AG -  ED    EKG None  Radiology DG Chest Port 1 View  Result Date: 08/17/2019 CLINICAL DATA:  Cough EXAM: PORTABLE CHEST 1 VIEW COMPARISON:  June 15, 2018 FINDINGS: The heart size and mediastinal contours are within normal limits. Both lungs are clear. The visualized skeletal structures are unremarkable. IMPRESSION: No active disease. Electronically Signed   By: Prudencio Pair M.D.   On: 08/17/2019 05:01    Procedures Procedures    Medications Ordered in ED Medications  acetaminophen (TYLENOL) tablet 650 mg (650 mg Oral Given 08/17/19 0344)  sodium chloride 0.9 % bolus 1,000 mL (0 mLs Intravenous Stopped 08/17/19 0445)  fentaNYL (SUBLIMAZE) injection 100 mcg (100 mcg Intravenous Given 08/17/19 0349)    ED Course  I have reviewed the triage vital signs and the nursing notes.  Pertinent labs & imaging results that were available during my care of the patient were reviewed by me and considered in my medical decision making (see chart for details).    MDM  Rules/Calculators/A&P                       4:47 AM Patient with previous history of breast cancer currently on chemo pill, previous history of partial nephrectomy presents with fever/abdominal pain.  She is already feeling improved after fluids and medications.  She is not septic appearing.  Due to cough will obtain chest x-ray.  We will continue to monitor 6:15 AM Patient improved.  Vitals appropriate.  No signs of pneumonia, negative for COVID-19.  Her neck is supple, low suspicion for meningitis.  She denies abdominal pain.  No signs of UTI.  She is not septic appearing.  No signs of acute abdominal emergency BP 110/63    Pulse 79    Temp 99.1 F (37.3 C) (Oral)    Resp 16    Ht 1.6  m (5\' 3" )    Wt 86.6 kg    SpO2 94%    BMI 33.83 kg/m  She Is requesting discharge home. Unclear cause of her fever.  However given her appearance, she is appropriate for discharge home.  We discussed return precautions   This patient presents to the ED for concern of fever and abdominal pain, this involves an extensive number of treatment options, and is a complaint that carries with it a high risk of complications and morbidity.  The differential diagnosis includes UTI, appendicitis, pancreatitis, pneumonia   Lab Tests:   I Ordered, reviewed, and interpreted labs, which included Covid test, urinalysis, complete blood count, metabolic panel  Medicines ordered:   I ordered medication fentanyl for pain  Imaging Studies ordered:   I ordered imaging studies which included chest x-ray   I independently visualized and interpreted imaging which showed no acute findings  Additional history obtained:    Previous records obtained and reviewed   Reevaluation:  After the interventions stated above, I reevaluated the patient and found patient is improved    Final Clinical Impression(s) / ED Diagnoses Final diagnoses:  Acute febrile illness  Generalized abdominal pain    Rx / DC Orders ED  Discharge Orders    None       Ripley Fraise, MD 08/17/19 [phone]

## 2019-09-01 DIAGNOSIS — G4733 Obstructive sleep apnea (adult) (pediatric): Secondary | ICD-10-CM | POA: Diagnosis not present

## 2019-10-01 DIAGNOSIS — G4733 Obstructive sleep apnea (adult) (pediatric): Secondary | ICD-10-CM | POA: Diagnosis not present

## 2019-11-01 DIAGNOSIS — G4733 Obstructive sleep apnea (adult) (pediatric): Secondary | ICD-10-CM | POA: Diagnosis not present

## 2019-12-02 DIAGNOSIS — G4733 Obstructive sleep apnea (adult) (pediatric): Secondary | ICD-10-CM | POA: Diagnosis not present

## 2019-12-16 ENCOUNTER — Emergency Department (HOSPITAL_COMMUNITY)
Admission: EM | Admit: 2019-12-16 | Discharge: 2019-12-16 | Disposition: A | Discharge status: Home / Self Care | Payer: BC Managed Care – PPO | Attending: Emergency Medicine | Admitting: Emergency Medicine

## 2019-12-16 ENCOUNTER — Encounter (HOSPITAL_COMMUNITY): Payer: Self-pay | Admitting: *Deleted

## 2019-12-16 ENCOUNTER — Other Ambulatory Visit: Payer: Self-pay

## 2019-12-16 ENCOUNTER — Emergency Department (HOSPITAL_COMMUNITY): Payer: BC Managed Care – PPO

## 2019-12-16 DIAGNOSIS — R112 Nausea with vomiting, unspecified: Secondary | ICD-10-CM | POA: Insufficient documentation

## 2019-12-16 DIAGNOSIS — R10815 Periumbilic abdominal tenderness: Secondary | ICD-10-CM | POA: Insufficient documentation

## 2019-12-16 DIAGNOSIS — R109 Unspecified abdominal pain: Secondary | ICD-10-CM | POA: Diagnosis not present

## 2019-12-16 DIAGNOSIS — Z79899 Other long term (current) drug therapy: Secondary | ICD-10-CM | POA: Insufficient documentation

## 2019-12-16 DIAGNOSIS — R9389 Abnormal findings on diagnostic imaging of other specified body structures: Secondary | ICD-10-CM

## 2019-12-16 DIAGNOSIS — N8500 Endometrial hyperplasia, unspecified: Secondary | ICD-10-CM | POA: Diagnosis not present

## 2019-12-16 DIAGNOSIS — I10 Essential (primary) hypertension: Secondary | ICD-10-CM | POA: Diagnosis not present

## 2019-12-16 DIAGNOSIS — K5790 Diverticulosis of intestine, part unspecified, without perforation or abscess without bleeding: Secondary | ICD-10-CM | POA: Insufficient documentation

## 2019-12-16 DIAGNOSIS — Z853 Personal history of malignant neoplasm of breast: Secondary | ICD-10-CM | POA: Insufficient documentation

## 2019-12-16 DIAGNOSIS — Z85528 Personal history of other malignant neoplasm of kidney: Secondary | ICD-10-CM | POA: Insufficient documentation

## 2019-12-16 DIAGNOSIS — R1013 Epigastric pain: Secondary | ICD-10-CM | POA: Diagnosis not present

## 2019-12-16 DIAGNOSIS — R197 Diarrhea, unspecified: Secondary | ICD-10-CM

## 2019-12-16 LAB — COMPREHENSIVE METABOLIC PANEL
ALT: 29 U/L (ref 0–44)
AST: 25 U/L (ref 15–41)
Albumin: 4 g/dL (ref 3.5–5.0)
Alkaline Phosphatase: 77 U/L (ref 38–126)
Anion gap: 10 (ref 5–15)
BUN: 12 mg/dL (ref 6–20)
CO2: 27 mmol/L (ref 22–32)
Calcium: 9.3 mg/dL (ref 8.9–10.3)
Chloride: 104 mmol/L (ref 98–111)
Creatinine, Ser: 0.66 mg/dL (ref 0.44–1.00)
GFR calc Af Amer: >60 mL/min (ref >60)
GFR calc non Af Amer: >60 mL/min (ref >60)
Glucose, Bld: 112 mg/dL — ABNORMAL HIGH (ref 70–99)
Potassium: 3.8 mmol/L (ref 3.5–5.1)
Sodium: 141 mmol/L (ref 135–145)
Total Bilirubin: 0.6 mg/dL (ref 0.3–1.2)
Total Protein: 7.3 g/dL (ref 6.5–8.1)

## 2019-12-16 LAB — URINALYSIS, ROUTINE W REFLEX MICROSCOPIC
Bilirubin Urine: NEGATIVE
Glucose, UA: NEGATIVE mg/dL
Ketones, ur: NEGATIVE mg/dL
Nitrite: NEGATIVE
Protein, ur: 30 mg/dL — AB
Specific Gravity, Urine: 1.014 (ref 1.005–1.030)
pH: 5 (ref 5.0–8.0)

## 2019-12-16 LAB — CBC WITH DIFFERENTIAL/PLATELET
Abs Immature Granulocytes: 0 10*3/uL (ref 0.00–0.07)
Basophils Absolute: 0 10*3/uL (ref 0.0–0.1)
Basophils Relative: 1 %
Eosinophils Absolute: 0.1 10*3/uL (ref 0.0–0.5)
Eosinophils Relative: 2 %
HCT: 39.6 % (ref 36.0–46.0)
Hemoglobin: 13.3 g/dL (ref 12.0–15.0)
Immature Granulocytes: 0 %
Lymphocytes Relative: 28 %
Lymphs Abs: 1.4 10*3/uL (ref 0.7–4.0)
MCH: 29.6 pg (ref 26.0–34.0)
MCHC: 33.6 g/dL (ref 30.0–36.0)
MCV: 88.2 fL (ref 80.0–100.0)
Monocytes Absolute: 0.4 10*3/uL (ref 0.1–1.0)
Monocytes Relative: 8 %
Neutro Abs: 2.9 10*3/uL (ref 1.7–7.7)
Neutrophils Relative %: 61 %
Platelets: 245 10*3/uL (ref 150–400)
RBC: 4.49 MIL/uL (ref 3.87–5.11)
RDW: 13.6 % (ref 11.5–15.5)
WBC: 4.8 10*3/uL (ref 4.0–10.5)
nRBC: 0 % (ref 0.0–0.2)

## 2019-12-16 LAB — LIPASE, BLOOD: Lipase: 28 U/L (ref 11–51)

## 2019-12-16 MED ORDER — DICYCLOMINE HCL 20 MG PO TABS: 20.0000 mg | ORAL_TABLET | Freq: Two times a day (BID) | ORAL | 0 refills | Status: DC

## 2019-12-16 MED ORDER — SODIUM CHLORIDE 0.9 % IV BOLUS
1000.0000 mL | Freq: Once | INTRAVENOUS | Status: AC
  Administered 2019-12-16: 1000 mL via INTRAVENOUS

## 2019-12-16 MED ORDER — IOHEXOL 300 MG/ML  SOLN
100.0000 mL | Freq: Once | INTRAMUSCULAR | Status: AC | PRN
  Administered 2019-12-16: 100 mL via INTRAVENOUS

## 2019-12-16 MED ORDER — ONDANSETRON HCL 4 MG PO TABS: 4.0000 mg | ORAL_TABLET | Freq: Four times a day (QID) | ORAL | 0 refills | Status: DC

## 2019-12-16 MED ORDER — ONDANSETRON HCL 4 MG/2ML IJ SOLN
4.0000 mg | Freq: Once | INTRAMUSCULAR | Status: AC
  Administered 2019-12-16: 4 mg via INTRAVENOUS
  Filled 2019-12-16: qty 2

## 2019-12-16 MED ORDER — DICYCLOMINE HCL 10 MG/ML IM SOLN
20.0000 mg | Freq: Once | INTRAMUSCULAR | Status: AC
  Administered 2019-12-16: 20 mg via INTRAMUSCULAR
  Filled 2019-12-16: qty 2

## 2019-12-16 NOTE — Discharge Instructions (Signed)
I suspect your symptoms today may be related to a viral illness.  You were given a prescription for Zofran and Bentyl to help with your symptoms.  Your laboratory work was all reassuring and the CT scan of your abdomen/pelvis did not show any emergent findings today.  There were some chronic/subacute findings and the impression is listed below.  "1.  No acute findings the or pelvis. 2. The endometrium is thickened and appears to contain a round mass, possibly a polyp, measuring 1.6 cm.  3.  Colonic diverticulosis without evidence of diverticulitis."  With regard to the thickening of your endometrium you will need to follow-up with your OB/GYN to obtain an outpatient ultrasound.  With regard to the evidence of diverticulosis please keep your appointment with your gastroenterologist later this week for further evaluation Peer  Please return to the ER sooner if you have any new or worsening symptoms, or if you have any of the following symptoms:  Abdominal pain that does not go away.  You have a fever.  You keep throwing up (vomiting).  The pain is felt only in portions of the abdomen. Pain in the right side could possibly be appendicitis. In an adult, pain in the left lower portion of the abdomen could be colitis or diverticulitis.  You pass bloody or black tarry stools.  There is bright red blood in the stool.  The constipation stays for more than 4 days.  There is belly (abdominal) or rectal pain.  You do not seem to be getting better.  You have any questions or concerns.

## 2019-12-16 NOTE — ED Triage Notes (Signed)
Pt c/o abdominal pain with nausea x one week

## 2019-12-16 NOTE — ED Provider Notes (Signed)
[facility] EMERGENCY DEPARTMENT Provider Note   CSN: [id number] Arrival date & time: 12/16/19  1215     History Chief Complaint  Patient presents with  . Abdominal Pain    Jennifer Harvey is a 59 y.o. female.  HPI    Pt is a 59 y/o female with a h/o breast CA, kidney CA, who presents to the ED today for eval of abd pain that started about 6 days. Pain located to the epigastrium/periubilical area. Pain is intermittent and seems to be worse prior to a BM. Rates discomfort as 3/10.  She reports associated nausea, vomiting (x1), and diarrhea (started several days ago)  Denies hematemesis, hematochezia, melena, urinary sxs, cough, chest pain, sob. Reports sweats, chills   Denies eating any suspect foods recently. Denies any sick contacts with similar sxs.  Oncology: Currently being tx with Tamoxifen by Dr. Laurena Slimmer in [place], [place].   Past Medical History:  Diagnosis Date  . Breast cancer ([place])   . Cancer [facility])    renal cell carcinoma 2007 s/p partial right nephrectomy   . History of kidney cancer   . Hypertension     Patient Active Problem List   Diagnosis Date Noted  . Malignant neoplasm of upper-inner quadrant of breast in female, estrogen receptor positive ([place]) 08/19/2017    Past Surgical History:  Procedure Laterality Date  . BREAST SURGERY    . CESAREAN SECTION  1994     OB History   No obstetric history on file.     Family History  Problem Relation Age of Onset  . Cancer Maternal Grandmother        stomach  . Cancer Maternal Grandfather        kidney cancer    Social History   Tobacco Use  . Smoking status: Never Smoker  . Smokeless tobacco: Never Used  Substance Use Topics  . Alcohol use: Yes    Alcohol/week: 2.0 standard drinks    Types: 2 Cans of beer per week    Comment: 2 beers per day  . Drug use: Not Currently    Home Medications Prior to Admission medications   Medication Sig Start Date End Date Taking? Authorizing  Provider  amLODipine (NORVASC) 5 MG tablet Take 5 mg by mouth daily.  07/29/17  Yes [provider]  benazepril-hydrochlorthiazide (LOTENSIN HCT) 5-6.25 MG tablet Take 1 tablet by mouth daily.   Yes [provider]  citalopram (CELEXA) 40 MG tablet Take 40 mg by mouth daily. 10/03/19  Yes [provider]  Cranberry-Milk Thistle (LIVER & KIDNEY CLEANSER PO) Take by mouth.   Yes [provider]  famotidine (PEPCID) 20 MG tablet Take 20 mg by mouth daily as needed for heartburn or indigestion. 10/09/19  Yes [provider]  gabapentin (NEURONTIN) 100 MG capsule Take 100 mg by mouth 3 (three) times daily. 10/09/19  Yes [provider]  Multiple Vitamins-Minerals (ONE-A-DAY WOMENS PO) Take 1 tablet by mouth daily.   Yes [provider]  OVER THE COUNTER MEDICATION Take 1 tablet by mouth daily as needed (hot flashes). Hot Flash Ease   Yes [provider]  tamoxifen (NOLVADEX) 20 MG tablet Take 20 mg by mouth daily. 12/10/19  Yes [provider]  dicyclomine (BENTYL) 20 MG tablet Take 1 tablet (20 mg total) by mouth 2 (two) times daily. 12/16/19   Treonna Klee S, PA-C  ibuprofen (ADVIL) 800 MG tablet Take 1 tablet (800 mg total) by mouth every 6 (six)  hours as needed for moderate pain. Patient not taking: Reported on 12/16/2019 12/17/18   Orpah Greek, MD  ondansetron (ZOFRAN) 4 MG tablet Take 1 tablet (4 mg total) by mouth every 6 (six) hours. 12/16/19   Soo Steelman S, PA-C    Allergies    Patient has no known allergies.  Review of Systems   Review of Systems  Constitutional: Positive for chills and diaphoresis.  HENT: Negative for ear pain and sore throat.   Eyes: Negative for pain and visual disturbance.  Respiratory: Negative for cough and shortness of breath.   Cardiovascular: Negative for chest pain.  Gastrointestinal: Positive for abdominal pain, diarrhea, nausea and vomiting.  Genitourinary: Negative  for dysuria, hematuria and urgency.  Musculoskeletal: Negative for back pain.  Skin: Negative for rash.  Neurological: Negative for headaches.  All other systems reviewed and are negative.   Physical Exam Updated Vital Signs BP 117/63   Pulse 72   Temp 98.8 F (37.1 C) (Oral)   Resp 18   Ht 5\' 6"  (1.676 m)   Wt 87.1 kg   SpO2 95%   BMI 30.99 kg/m   Physical Exam Vitals and nursing note reviewed.  Constitutional:      General: She is not in acute distress.    Appearance: She is well-developed.  HENT:     Head: Normocephalic and atraumatic.  Eyes:     Conjunctiva/sclera: Conjunctivae normal.  Cardiovascular:     Rate and Rhythm: Normal rate and regular rhythm.     Heart sounds: Normal heart sounds. No murmur heard.   Pulmonary:     Effort: Pulmonary effort is normal. No respiratory distress.     Breath sounds: Normal breath sounds. No wheezing, rhonchi or rales.  Abdominal:     General: Bowel sounds are normal.     Palpations: Abdomen is soft.     Tenderness: There is abdominal tenderness in the epigastric area and periumbilical area. There is no guarding or rebound.  Musculoskeletal:     Cervical back: Neck supple.  Skin:    General: Skin is warm and dry.  Neurological:     Mental Status: She is alert.     ED Results / Procedures / Treatments   Labs (all labs ordered are listed, but only abnormal results are displayed) Labs Reviewed  COMPREHENSIVE METABOLIC PANEL - Abnormal; Notable for the following components:      Result Value   Glucose, Bld 112 (*)    All other components within normal limits  URINALYSIS, ROUTINE W REFLEX MICROSCOPIC - Abnormal; Notable for the following components:   APPearance HAZY (*)    Hgb urine dipstick SMALL (*)    Protein, ur 30 (*)    Leukocytes,Ua TRACE (*)    Bacteria, UA MANY (*)    All other components within normal limits  CBC WITH DIFFERENTIAL/PLATELET  LIPASE, BLOOD    EKG None  Radiology CT ABDOMEN PELVIS W  CONTRAST  Result Date: 12/16/2019 CLINICAL DATA:  abdominal pain with nausea x one week. Hx of breast cancer, kidney cancer, HTN, C-SEC. Pt c/o centralized abdominal pain with n/v/d x 3 days EXAM: CT ABDOMEN AND PELVIS WITH CONTRAST TECHNIQUE: Multidetector CT imaging of the abdomen and pelvis was performed using the standard protocol following bolus administration of intravenous contrast. CONTRAST:  185mL OMNIPAQUE IOHEXOL 300 MG/ML  SOLN COMPARISON:  CT abdomen pelvis 07/26/2019 FINDINGS: Lower chest: No acute abnormality. Hepatobiliary: No focal liver abnormality is seen. Gallbladder is not visualized. Pancreas: Unremarkable.  No pancreatic ductal dilatation or surrounding inflammatory changes. Spleen: Normal in size without focal abnormality. Adrenals/Urinary Tract: Adrenal glands are unremarkable. Bilateral renal cysts including a hyperattenuating cyst in the medial right kidney. No hydronephrosis or renal calculi. Urinary bladder unremarkable. Stomach/Bowel: Stomach is within normal limits. Appendix appears normal. No evidence of bowel wall thickening, distention, or inflammatory changes. Colonic diverticula without of diverticulitis. Vascular/Lymphatic: No significant vascular findings are present. No enlarged abdominal or pelvic lymph nodes. Reproductive: The endometrium is thickened and appears to contain a round mass measuring 1.6 cm (series 3, image 68). Other: No abdominal wall hernia or abnormality. No abdominopelvic ascites. Musculoskeletal: Degenerative changes at the pubic symphysis. IMPRESSION: 1.  No acute findings the or pelvis. 2. The endometrium is thickened and appears to contain a round mass, possibly a polyp, measuring 1.6 cm. Recommend pelvic ultrasound for further evaluation. 3.  Colonic diverticulosis without evidence of diverticulitis. Electronically Signed   By: Audie Pinto M.D.   On: 12/16/2019 16:23    Procedures Procedures (including critical care time)  Medications  Ordered in ED Medications  sodium chloride 0.9 % bolus 1,000 mL (0 mLs Intravenous Stopped 12/16/19 1520)  ondansetron (ZOFRAN) injection 4 mg (4 mg Intravenous Given 12/16/19 1337)  dicyclomine (BENTYL) injection 20 mg (20 mg Intramuscular Given 12/16/19 1337)  iohexol (OMNIPAQUE) 300 MG/ML solution 100 mL (100 mLs Intravenous Contrast Given 12/16/19 1556)    ED Course  I have reviewed the triage vital signs and the nursing notes.  Pertinent labs & imaging results that were available during my care of the patient were reviewed by me and considered in my medical decision making (see chart for details).    MDM Rules/Calculators/A&P                          59 year old female presenting the emergency department today for evaluation of abdominal pain, nausea vomiting and diarrhea.  Started earlier this week nausea and abdominal pain.  Over the last few days has progressed to having vomiting and diarrhea as well.  Vital signs reassuring on initial evaluation.  Mild periumbilical and epigastric tenderness on exam without obvious peritoneal signs.  We will start work-up with labs, UA and give IV fluids and medications.  CBC is without leukocytosis or anemia CMP shows normal electrolytes, normal kidney and liver function, normal bilirubin Lipase negative UA without significant evidence for urinary tract infection.  CT abd/pelvis - 1.  No acute findings the or pelvis. 2. The endometrium is thickened and appears to contain a round mass, possibly a polyp, measuring 1.6 cm. Recommend pelvic ultrasound fo  further evaluation. 3.  Colonic diverticulosis without evidence of diverticulitis.   Patient given IV fluids, Zofran, Bentyl.  On reassessment patient states she is feeling much improved.  She has had no further episodes of vomiting and has been able to tolerate ginger ale.  I suspect her symptoms may be related to viral illness and will give Rx for symptomatic management with Zofran and Bentyl.  She  did have an incidental finding of a possible polyp on her uterus and I recommended following up with her OB/GYN about this.  She also has an appoint with her gastroenterologist next week.  Advised her to keep this appointment and return to the emergency department for any new or worsening symptoms.  Final Clinical Impression(s) / ED Diagnoses Final diagnoses:  Nausea vomiting and diarrhea  Diverticulosis  Thickened endometrium    Rx / DC Orders  ED Discharge Orders         Ordered    ondansetron (ZOFRAN) 4 MG tablet  Every 6 hours        12/16/19 1704    dicyclomine (BENTYL) 20 MG tablet  2 times daily        12/16/19 [address] Anjelique Makar S, PA-C 12/16/19 1706    Milton Ferguson, MD 12/17/19 [phone]

## 2019-12-16 NOTE — ED Notes (Signed)
Pt was given ginger ale  Pt will ring when she has urine sample

## 2019-12-27 ENCOUNTER — Other Ambulatory Visit: Payer: Self-pay

## 2019-12-27 ENCOUNTER — Ambulatory Visit (INDEPENDENT_AMBULATORY_CARE_PROVIDER_SITE_OTHER): Payer: BC Managed Care – PPO | Admitting: Obstetrics & Gynecology

## 2019-12-27 ENCOUNTER — Encounter: Payer: Self-pay | Admitting: Obstetrics & Gynecology

## 2019-12-27 VITALS — BP 137/86 | HR 72 | Ht 66.0 in | Wt 191.8 lb

## 2019-12-27 DIAGNOSIS — N9489 Other specified conditions associated with female genital organs and menstrual cycle: Secondary | ICD-10-CM

## 2019-12-27 NOTE — Progress Notes (Signed)
Follow up appointment for results  Chief Complaint  Patient presents with  . Follow-up    ER mass 1.6 cm    Blood pressure 137/86, pulse 72, height 5\' 6"  (1.676 m), weight 191 lb 12.8 oz (87 kg).    Study Result  Narrative & Impression  CLINICAL DATA:  abdominal pain with nausea x one week. Hx of breast cancer, kidney cancer, HTN, C-SEC. Pt c/o centralized abdominal pain with n/v/d x 3 days  EXAM: CT ABDOMEN AND PELVIS WITH CONTRAST  TECHNIQUE: Multidetector CT imaging of the abdomen and pelvis was performed using the standard protocol following bolus administration of intravenous contrast.  CONTRAST:  167mL OMNIPAQUE IOHEXOL 300 MG/ML  SOLN  COMPARISON:  CT abdomen pelvis 07/26/2019  FINDINGS: Lower chest: No acute abnormality.  Hepatobiliary: No focal liver abnormality is seen. Gallbladder is not visualized.  Pancreas: Unremarkable. No pancreatic ductal dilatation or surrounding inflammatory changes.  Spleen: Normal in size without focal abnormality.  Adrenals/Urinary Tract: Adrenal glands are unremarkable. Bilateral renal cysts including a hyperattenuating cyst in the medial right kidney. No hydronephrosis or renal calculi. Urinary bladder unremarkable.  Stomach/Bowel: Stomach is within normal limits. Appendix appears normal. No evidence of bowel wall thickening, distention, or inflammatory changes. Colonic diverticula without of diverticulitis.  Vascular/Lymphatic: No significant vascular findings are present. No enlarged abdominal or pelvic lymph nodes.  Reproductive: The endometrium is thickened and appears to contain a round mass measuring 1.6 cm (series 3, image 68).  Other: No abdominal wall hernia or abnormality. No abdominopelvic ascites.  Musculoskeletal: Degenerative changes at the pubic symphysis.  IMPRESSION: 1.  No acute findings the or pelvis.  2. The endometrium is thickened and appears to contain a round  mass, possibly a polyp, measuring 1.6 cm. Recommend pelvic ultrasound for further evaluation.  3.  Colonic diverticulosis without evidence of diverticulitis.   Electronically Signed   By: Audie Pinto M.D.   On: 12/16/2019 16:23     MEDS ordered this encounter: No orders of the defined types were placed in this encounter.   Orders for this encounter: No orders of the defined types were placed in this encounter.   Impression:   ICD-10-CM   1. Endometrial mass, favor fibroid over polyp  N94.89       Plan: Will do sonogram for full evaluation, will not require removal unless there are unusual characteristics, plus does have history of 2 cancers, breast and RCC  Follow Up: Return in about 2 weeks (around 01/10/2020) for GYN sono, Follow up, with Dr Elonda Husky.       Face to face time:  20 minutes  Greater than 50% of the visit time was spent in counseling and coordination of care with the patient.  The summary and outline of the counseling and care coordination is summarized in the note above.   All questions were answered.  Past Medical History:  Diagnosis Date  . Breast cancer ([place])   . Cancer [facility])    renal cell carcinoma 2007 s/p partial right nephrectomy   . History of kidney cancer   . Hypertension     Past Surgical History:  Procedure Laterality Date  . BREAST SURGERY    . CESAREAN SECTION  1994    OB History    Gravida  4   Para      Term      Preterm      AB      Living  3     SAB  TAB      Ectopic      Multiple      Live Births  3           No Known Allergies  Social History   Socioeconomic History  . Marital status: Married    Spouse name: Not on file  . Number of children: 4  . Years of education: Not on file  . Highest education level: Not on file  Occupational History  . Occupation: makes cookies    Employer: [employer]  Tobacco Use  . Smoking status: Never Smoker  . Smokeless tobacco: Never Used   Vaping Use  . Vaping Use: Never used  Substance and Sexual Activity  . Alcohol use: Yes    Alcohol/week: 2.0 standard drinks    Types: 2 Cans of beer per week    Comment: 2 beers per day  . Drug use: Not Currently  . Sexual activity: Yes  Other Topics Concern  . Not on file  Social History Narrative  . Not on file   Social Determinants of Health   Financial Resource Strain: Low Risk   . Difficulty of Paying Living Expenses: Not hard at all  Food Insecurity: No Food Insecurity  . Worried About Charity fundraiser in the Last Year: Never true  . Ran Out of Food in the Last Year: Never true  Transportation Needs: No Transportation Needs  . Lack of Transportation (Medical): No  . Lack of Transportation (Non-Medical): No  Physical Activity: Insufficiently Active  . Days of Exercise per Week: 2 days  . Minutes of Exercise per Session: 20 min  Stress: No Stress Concern Present  . Feeling of Stress : Only a little  Social Connections: Moderately Integrated  . Frequency of Communication with Friends and Family: Three times a week  . Frequency of Social Gatherings with Friends and Family: Three times a week  . Attends Religious Services: 1 to 4 times per year  . Active Member of Clubs or Organizations: No  . Attends Archivist Meetings: Never  . Marital Status: Married    Family History  Problem Relation Age of Onset  . Cancer Maternal Grandmother        stomach  . Cancer Maternal Grandfather        kidney cancer  . Other Father        auto accident

## 2020-01-02 ENCOUNTER — Other Ambulatory Visit: Payer: Self-pay | Admitting: *Deleted

## 2020-01-21 ENCOUNTER — Other Ambulatory Visit: Payer: Self-pay | Admitting: Obstetrics & Gynecology

## 2020-01-21 ENCOUNTER — Telehealth: Payer: Self-pay | Admitting: Obstetrics & Gynecology

## 2020-01-21 DIAGNOSIS — N840 Polyp of corpus uteri: Secondary | ICD-10-CM

## 2020-01-21 NOTE — Telephone Encounter (Signed)
Can someone review patient chart to be orders are placed for u/s. When patient checked out in oct somehow the apt was not made at the time. Patient walked into have u/s and f/u. [place] scheduled for tmr afternoon and f/u on mon 01/28/20

## 2020-01-22 ENCOUNTER — Other Ambulatory Visit: Payer: Self-pay

## 2020-01-22 ENCOUNTER — Ambulatory Visit (INDEPENDENT_AMBULATORY_CARE_PROVIDER_SITE_OTHER): Payer: BC Managed Care – PPO

## 2020-01-22 DIAGNOSIS — N840 Polyp of corpus uteri: Secondary | ICD-10-CM | POA: Diagnosis not present

## 2020-01-22 NOTE — Progress Notes (Signed)
PELVIC US TA/TV: heterogeneous anteverted uterus with hypoechoic linear striations (? adenomyosis),mult small fibroids (#1) posterior LUS intramural fibroid 1.4 x 1.4 x  1.6 cm(#2) anterior mid intramural fibroid .9 x .6 x 1 cm,heterogenous thickened endometrium with a vascular hypoechoic endometrial mass 2.2 x 1.7 x 2 cm,EEC 14.6 mm,no free fluid,no pain during ultrasound   Chaperone Peggy

## 2020-01-28 ENCOUNTER — Ambulatory Visit: Payer: BC Managed Care – PPO | Admitting: Obstetrics & Gynecology

## 2020-02-12 ENCOUNTER — Ambulatory Visit (INDEPENDENT_AMBULATORY_CARE_PROVIDER_SITE_OTHER): Payer: BC Managed Care – PPO | Admitting: Obstetrics & Gynecology

## 2020-02-12 ENCOUNTER — Encounter: Payer: Self-pay | Admitting: Obstetrics & Gynecology

## 2020-02-12 ENCOUNTER — Other Ambulatory Visit: Payer: Self-pay

## 2020-02-12 VITALS — BP 111/65 | HR 65 | Wt 194.0 lb

## 2020-02-12 DIAGNOSIS — R9389 Abnormal findings on diagnostic imaging of other specified body structures: Secondary | ICD-10-CM

## 2020-02-12 DIAGNOSIS — N9489 Other specified conditions associated with female genital organs and menstrual cycle: Secondary | ICD-10-CM | POA: Diagnosis not present

## 2020-02-12 NOTE — Progress Notes (Signed)
Follow up appointment for results  Chief Complaint  Patient presents with  . Follow-up    Blood pressure 111/65, pulse 65, weight 194 lb (88 kg).  GYNECOLOGIC SONOGRAM   Jennifer Harvey is a 59 y.o. G4P0 No LMP recorded. Patient is postmenopausal. She is here for a pelvic sonogram for endometrial mass seen on CT,HX of breast and renal cancer.  Uterus                      8.13 x 5.9 x 7.7 cm, Total uterine volume 192 Cc, heterogeneous anteverted uterus with hypoechoic linear striations (? adenomyosis),mult small fibroids (#1) posterior LUS intramural fibroid 1.4 x 1.4 x  1.6 cm(#2) anterior mid intramural fibroid .9 x .6 x 1 cm  Endometrium          14.6 mm, symmetrical, heterogenous thickened endometrium with a vascular hypoechoic endometrial mass 2.2 x 1.7 x 2 cm  Right ovary             2.3 x 1.5 x 2.3 cm, wnl  Left ovary                3 x 1.8 x 2.9 cm, wnl  No free fluid   Technician Comments:  PELVIC US TA/TV: heterogeneous anteverted uterus with hypoechoic linear striations (? adenomyosis),mult small fibroids (#1) posterior LUS intramural fibroid 1.4 x 1.4 x  1.6 cm(#2) anterior mid intramural fibroid .9 x .6 x 1 cm,heterogenous thickened endometrium with a vascular hypoechoic endometrial mass 2.2 x 1.7 x 2 cm,EEC 14.6 mm,normal ovaries,no free fluid,no pain during ultrasound   Chaperone 322 Jennifer Harvey Jennifer Harvey 01/22/2020 5:12 PM  Clinical Impression and recommendations:  I have reviewed the sonogram results above, combined with the patient's current clinical course, below are my impressions and any appropriate recommendations for management based on the sonographic findings.  Uterus is upper limits of normal, especially for a postmenopausal woman, with appearance of adenomyosis, and small myomas Endometrium has a 2 cm mass, appearance most likely a submucosal myoma Both ovaries are normal size shape and morphology  Clinical  recommendations: Endometrial mass most likely fibroid, consider follow up scan 6 months vs hysteroscopically directed removal   Jennifer Harvey 02/01/2020 7:44 PM    Result History      MEDS ordered this encounter: No orders of the defined types were placed in this encounter.   Orders for this encounter: No orders of the defined types were placed in this encounter.   Impression:   ICD-10-CM   1. Endometrial mass, favor fibroid over polyp  N94.89   2. Thickened endometrium, 14 mm, postmenopausal 2019  R93.89      Plan: Hysteroscopy uterine curettage removal  Of endometrial mass tentatively scheduled 03/12/20  Follow Up: Return in about 6 weeks (around 03/24/2020) for Jennifer Harvey, with Dr Jennifer Harvey.       Face to face time:  20 minutes  Greater than 50% of the visit time was spent in counseling and coordination of care with the patient.  The summary and outline of the counseling and care coordination is summarized in the note above.   All questions were answered.  Past Medical History:  Diagnosis Date  . Breast cancer ([place])   . Cancer [facility])    renal cell carcinoma 2007 s/p partial right nephrectomy   . History of kidney cancer   . Hypertension     Past Surgical History:  Procedure Laterality Date  . BREAST  SURGERY    . CESAREAN SECTION  1994    OB History    Gravida  4   Para      Term      Preterm      AB      Living  3     SAB      TAB      Ectopic      Multiple      Live Births  3           No Known Allergies  Social History   Socioeconomic History  . Marital status: Married    Spouse name: Not on file  . Number of children: 4  . Years of education: Not on file  . Highest education level: Not on file  Occupational History  . Occupation: makes cookies    Employer: [employer]  Tobacco Use  . Smoking status: Never Smoker  . Smokeless tobacco: Never Used  Vaping Use  . Vaping Use: Never used  Substance and Sexual Activity   . Alcohol use: Yes    Alcohol/week: 2.0 standard drinks    Types: 2 Cans of beer per week    Comment: 2 beers per day  . Drug use: Not Currently  . Sexual activity: Yes  Other Topics Concern  . Not on file  Social History Narrative  . Not on file   Social Determinants of Health   Financial Resource Strain: Low Risk   . Difficulty of Paying Living Expenses: Not hard at all  Food Insecurity: No Food Insecurity  . Worried About Charity fundraiser in the Last Year: Never true  . Ran Out of Food in the Last Year: Never true  Transportation Needs: No Transportation Needs  . Lack of Transportation (Medical): No  . Lack of Transportation (Non-Medical): No  Physical Activity: Insufficiently Active  . Days of Exercise per Week: 2 days  . Minutes of Exercise per Session: 20 min  Stress: No Stress Concern Present  . Feeling of Stress : Only a little  Social Connections: Moderately Integrated  . Frequency of Communication with Friends and Family: Three times a week  . Frequency of Social Gatherings with Friends and Family: Three times a week  . Attends Religious Services: 1 to 4 times per year  . Active Member of Clubs or Organizations: No  . Attends Archivist Meetings: Never  . Marital Status: Married    Family History  Problem Relation Age of Onset  . Cancer Maternal Grandmother        stomach  . Cancer Maternal Grandfather        kidney cancer  . Other Father        auto accident

## 2020-03-04 ENCOUNTER — Other Ambulatory Visit: Payer: Self-pay | Admitting: Obstetrics & Gynecology

## 2020-03-04 NOTE — Patient Instructions (Signed)
Jennifer Harvey [facility]  03/04/2020     @[email or web address] @   Your procedure is scheduled on  03/12/2020.  Report to [facility] at  [phone]  A.M.  Call this number if you have problems the morning of surgery:  [phone]   Remember:  Do not eat or drink after midnight.                         Take these medicines the morning of surgery with A SIP OF WATER  Amlodipine, celexa, pepcid, gabapentin, prilosec, tamoxifen.    Do not wear jewelry, make-up or nail polish.  Do not wear lotions, powders, or perfumes. Please wear deodorant and brush your teeth,  Do not shave 48 hours prior to surgery.  Men may shave face and neck.  Do not bring valuables to the hospital.  [facility] is not responsible for any belongings or valuables.  Contacts, dentures or bridgework may not be worn into surgery.  Leave your suitcase in the car.  After surgery it may be brought to your room.  For patients admitted to the hospital, discharge time will be determined by your treatment team.  Patients discharged the day of surgery will not be allowed to drive home.   Name and phone number of your driver:   family Special instructions:  DO NOT smoke the day of your procedure.  Please read over the following fact sheets that you were given. Anesthesia Post-op Instructions and Care and Recovery After Surgery       Dilation and Curettage or Vacuum Curettage, Care After These instructions give you information about caring for yourself after your procedure. Your doctor may also give you more specific instructions. Call your doctor if you have any problems or questions after your procedure. Follow these instructions at home: Activity  Do not drive or use heavy machinery while taking prescription pain medicine.  For 24 hours after your procedure, avoid driving.  Take short walks often, followed by rest periods. Ask your doctor what activities are safe for you. After one or two days, you may be  able to return to your normal activities.  Do not lift anything that is heavier than 10 lb (4.5 kg) until your doctor approves.  For at least 2 weeks, or as long as told by your doctor: ? Do not douche. ? Do not use tampons. ? Do not have sex. General instructions   Take over-the-counter and prescription medicines only as told by your doctor. This is very important if you take blood thinning medicine.  Do not take baths, swim, or use a hot tub until your doctor approves. Take showers instead of baths.  Wear compression stockings as told by your doctor.  It is up to you to get the results of your procedure. Ask your doctor when your results will be ready.  Keep all follow-up visits as told by your doctor. This is important. Contact a doctor if:  You have very bad cramps that get worse or do not get better with medicine.  You have very bad pain in your belly (abdomen).  You cannot drink fluids without throwing up (vomiting).  You get pain in a different part of the area between your belly and thighs (pelvis).  You have bad-smelling discharge from your vagina.  You have a rash. Get help right away if:  You are bleeding a lot from your vagina. A lot of bleeding means  soaking more than one sanitary pad in an hour, for 2 hours in a row.  You have clumps of blood (blood clots) coming from your vagina.  You have a fever or chills.  Your belly feels very tender or hard.  You have chest pain.  You have trouble breathing.  You cough up blood.  You feel dizzy.  You feel light-headed.  You pass out (faint).  You have pain in your neck or shoulder area. Summary  Take short walks often, followed by rest periods. Ask your doctor what activities are safe for you. After one or two days, you may be able to return to your normal activities.  Do not lift anything that is heavier than 10 lb (4.5 kg) until your doctor approves.  Do not take baths, swim, or use a hot tub until  your doctor approves. Take showers instead of baths.  Contact your doctor if you have any symptoms of infection, like bad-smelling discharge from your vagina. This information is not intended to replace advice given to you by your health care provider. Make sure you discuss any questions you have with your health care provider. Document Revised: 02/11/2017 Document Reviewed: 11/17/2015 Elsevier Patient Education  2020 [place] Anesthesia, Adult, Care After This sheet gives you information about how to care for yourself after your procedure. Your health care provider may also give you more specific instructions. If you have problems or questions, contact your health care provider. What can I expect after the procedure? After the procedure, the following side effects are common:  Pain or discomfort at the IV site.  Nausea.  Vomiting.  Sore throat.  Trouble concentrating.  Feeling cold or chills.  Weak or tired.  Sleepiness and fatigue.  Soreness and body aches. These side effects can affect parts of the body that were not involved in surgery. Follow these instructions at home:  For at least 24 hours after the procedure:  Have a responsible adult stay with you. It is important to have someone help care for you until you are awake and alert.  Rest as needed.  Do not: ? Participate in activities in which you could fall or become injured. ? Drive. ? Use heavy machinery. ? Drink alcohol. ? Take sleeping pills or medicines that cause drowsiness. ? Make important decisions or sign legal documents. ? Take care of children on your own. Eating and drinking  Follow any instructions from your health care provider about eating or drinking restrictions.  When you feel hungry, start by eating small amounts of foods that are soft and easy to digest (bland), such as toast. Gradually return to your regular diet.  Drink enough fluid to keep your urine pale yellow.  If  you vomit, rehydrate by drinking water, juice, or clear broth. General instructions  If you have sleep apnea, surgery and certain medicines can increase your risk for breathing problems. Follow instructions from your health care provider about wearing your sleep device: ? Anytime you are sleeping, including during daytime naps. ? While taking prescription pain medicines, sleeping medicines, or medicines that make you drowsy.  Return to your normal activities as told by your health care provider. Ask your health care provider what activities are safe for you.  Take over-the-counter and prescription medicines only as told by your health care provider.  If you smoke, do not smoke without supervision.  Keep all follow-up visits as told by your health care provider. This is important. Contact a health care  provider if:  You have nausea or vomiting that does not get better with medicine.  You cannot eat or drink without vomiting.  You have pain that does not get better with medicine.  You are unable to pass urine.  You develop a skin rash.  You have a fever.  You have redness around your IV site that gets worse. Get help right away if:  You have difficulty breathing.  You have chest pain.  You have blood in your urine or stool, or you vomit blood. Summary  After the procedure, it is common to have a sore throat or nausea. It is also common to feel tired.  Have a responsible adult stay with you for the first 24 hours after general anesthesia. It is important to have someone help care for you until you are awake and alert.  When you feel hungry, start by eating small amounts of foods that are soft and easy to digest (bland), such as toast. Gradually return to your regular diet.  Drink enough fluid to keep your urine pale yellow.  Return to your normal activities as told by your health care provider. Ask your health care provider what activities are safe for you. This  information is not intended to replace advice given to you by your health care provider. Make sure you discuss any questions you have with your health care provider. Document Revised: 03/04/2017 Document Reviewed: 10/15/2016 Elsevier Patient Education  [place]. How to Use Chlorhexidine for Bathing Chlorhexidine gluconate (CHG) is a germ-killing (antiseptic) solution that is used to clean the skin. It can get rid of the bacteria that normally live on the skin and can keep them away for about 24 hours. To clean your skin with CHG, you may be given:  A CHG solution to use in the shower or as part of a sponge bath.  A prepackaged cloth that contains CHG. Cleaning your skin with CHG may help lower the risk for infection:  While you are staying in the intensive care unit of the hospital.  If you have a vascular access, such as a central line, to provide short-term or long-term access to your veins.  If you have a catheter to drain urine from your bladder.  If you are on a ventilator. A ventilator is a machine that helps you breathe by moving air in and out of your lungs.  After surgery. What are the risks? Risks of using CHG include:  A skin reaction.  Hearing loss, if CHG gets in your ears.  Eye injury, if CHG gets in your eyes and is not rinsed out.  The CHG product catching fire. Make sure that you avoid smoking and flames after applying CHG to your skin. Do not use CHG:  If you have a chlorhexidine allergy or have previously reacted to chlorhexidine.  On babies younger than 78 months of age. How to use CHG solution  Use CHG only as told by your health care provider, and follow the instructions on the label.  Use the full amount of CHG as directed. Usually, this is one bottle. During a shower Follow these steps when using CHG solution during a shower (unless your health care provider gives you different instructions): 1. Start the shower. 2. Use your normal soap and  shampoo to wash your face and hair. 3. Turn off the shower or move out of the shower stream. 4. Pour the CHG onto a clean washcloth. Do not use any type of brush or  rough-edged sponge. 5. Starting at your neck, lather your body down to your toes. Make sure you follow these instructions: ? If you will be having surgery, pay special attention to the part of your body where you will be having surgery. Scrub this area for at least 1 minute. ? Do not use CHG on your head or face. If the solution gets into your ears or eyes, rinse them well with water. ? Avoid your genital area. ? Avoid any areas of skin that have broken skin, cuts, or scrapes. ? Scrub your back and under your arms. Make sure to wash skin folds. 6. Let the lather sit on your skin for 1-2 minutes or as long as told by your health care provider. 7. Thoroughly rinse your entire body in the shower. Make sure that all body creases and crevices are rinsed well. 8. Dry off with a clean towel. Do not put any substances on your body afterward--such as powder, lotion, or perfume--unless you are told to do so by your health care provider. Only use lotions that are recommended by the manufacturer. 9. Put on clean clothes or pajamas. 10. If it is the night before your surgery, sleep in clean sheets.  During a sponge bath Follow these steps when using CHG solution during a sponge bath (unless your health care provider gives you different instructions): 1. Use your normal soap and shampoo to wash your face and hair. 2. Pour the CHG onto a clean washcloth. 3. Starting at your neck, lather your body down to your toes. Make sure you follow these instructions: ? If you will be having surgery, pay special attention to the part of your body where you will be having surgery. Scrub this area for at least 1 minute. ? Do not use CHG on your head or face. If the solution gets into your ears or eyes, rinse them well with water. ? Avoid your genital  area. ? Avoid any areas of skin that have broken skin, cuts, or scrapes. ? Scrub your back and under your arms. Make sure to wash skin folds. 4. Let the lather sit on your skin for 1-2 minutes or as long as told by your health care provider. 5. Using a different clean, wet washcloth, thoroughly rinse your entire body. Make sure that all body creases and crevices are rinsed well. 6. Dry off with a clean towel. Do not put any substances on your body afterward--such as powder, lotion, or perfume--unless you are told to do so by your health care provider. Only use lotions that are recommended by the manufacturer. 7. Put on clean clothes or pajamas. 8. If it is the night before your surgery, sleep in clean sheets. How to use CHG prepackaged cloths  Only use CHG cloths as told by your health care provider, and follow the instructions on the label.  Use the CHG cloth on clean, dry skin.  Do not use the CHG cloth on your head or face unless your health care provider tells you to.  When washing with the CHG cloth: ? Avoid your genital area. ? Avoid any areas of skin that have broken skin, cuts, or scrapes. Before surgery Follow these steps when using a CHG cloth to clean before surgery (unless your health care provider gives you different instructions): 1. Using the CHG cloth, vigorously scrub the part of your body where you will be having surgery. Scrub using a back-and-forth motion for 3 minutes. The area on your body should be completely  wet with CHG when you are done scrubbing. 2. Do not rinse. Discard the cloth and let the area air-dry. Do not put any substances on the area afterward, such as powder, lotion, or perfume. 3. Put on clean clothes or pajamas. 4. If it is the night before your surgery, sleep in clean sheets.  For general bathing Follow these steps when using CHG cloths for general bathing (unless your health care provider gives you different instructions). 1. Use a separate CHG  cloth for each area of your body. Make sure you wash between any folds of skin and between your fingers and toes. Wash your body in the following order, switching to a new cloth after each step: ? The front of your neck, shoulders, and chest. ? Both of your arms, under your arms, and your hands. ? Your stomach and groin area, avoiding the genitals. ? Your right leg and foot. ? Your left leg and foot. ? The back of your neck, your back, and your buttocks. 2. Do not rinse. Discard the cloth and let the area air-dry. Do not put any substances on your body afterward--such as powder, lotion, or perfume--unless you are told to do so by your health care provider. Only use lotions that are recommended by the manufacturer. 3. Put on clean clothes or pajamas. Contact a health care provider if:  Your skin gets irritated after scrubbing.  You have questions about using your solution or cloth. Get help right away if:  Your eyes become very red or swollen.  Your eyes itch badly.  Your skin itches badly and is red or swollen.  Your hearing changes.  You have trouble seeing.  You have swelling or tingling in your mouth or throat.  You have trouble breathing.  You swallow any chlorhexidine. Summary  Chlorhexidine gluconate (CHG) is a germ-killing (antiseptic) solution that is used to clean the skin. Cleaning your skin with CHG may help to lower your risk for infection.  You may be given CHG to use for bathing. It may be in a bottle or in a prepackaged cloth to use on your skin. Carefully follow your health care provider's instructions and the instructions on the product label.  Do not use CHG if you have a chlorhexidine allergy.  Contact your health care provider if your skin gets irritated after scrubbing. This information is not intended to replace advice given to you by your health care provider. Make sure you discuss any questions you have with your health care provider. Document Revised:  05/18/2018 Document Reviewed: 01/27/2017 Elsevier Patient Education  [place].

## 2020-03-05 ENCOUNTER — Encounter (HOSPITAL_COMMUNITY): Payer: Self-pay

## 2020-03-05 ENCOUNTER — Encounter (HOSPITAL_COMMUNITY)
Admission: RE | Admit: 2020-03-05 | Discharge: 2020-03-05 | Disposition: A | Discharge status: Home / Self Care | Payer: BC Managed Care – PPO | Source: Ambulatory Visit | Attending: Obstetrics & Gynecology | Admitting: Obstetrics & Gynecology

## 2020-03-05 ENCOUNTER — Other Ambulatory Visit: Payer: Self-pay

## 2020-03-05 DIAGNOSIS — Z01818 Encounter for other preprocedural examination: Secondary | ICD-10-CM | POA: Insufficient documentation

## 2020-03-05 HISTORY — DX: Sleep apnea, unspecified: G47.30

## 2020-03-05 LAB — RAPID HIV SCREEN (HIV 1/2 AB+AG)
HIV 1/2 Antibodies: NONREACTIVE
HIV-1 P24 Antigen - HIV24: NONREACTIVE

## 2020-03-05 LAB — COMPREHENSIVE METABOLIC PANEL
ALT: 19 U/L (ref 0–44)
AST: 22 U/L (ref 15–41)
Albumin: 3.8 g/dL (ref 3.5–5.0)
Alkaline Phosphatase: 74 U/L (ref 38–126)
Anion gap: 11 (ref 5–15)
BUN: 12 mg/dL (ref 6–20)
CO2: 25 mmol/L (ref 22–32)
Calcium: 9.2 mg/dL (ref 8.9–10.3)
Chloride: 103 mmol/L (ref 98–111)
Creatinine, Ser: 0.62 mg/dL (ref 0.44–1.00)
GFR, Estimated: >60 mL/min (ref >60)
Glucose, Bld: 91 mg/dL (ref 70–99)
Potassium: 3.8 mmol/L (ref 3.5–5.1)
Sodium: 139 mmol/L (ref 135–145)
Total Bilirubin: 0.4 mg/dL (ref 0.3–1.2)
Total Protein: 6.7 g/dL (ref 6.5–8.1)

## 2020-03-05 LAB — CBC
HCT: 37.2 % (ref 36.0–46.0)
Hemoglobin: 12.3 g/dL (ref 12.0–15.0)
MCH: 29.6 pg (ref 26.0–34.0)
MCHC: 33.1 g/dL (ref 30.0–36.0)
MCV: 89.6 fL (ref 80.0–100.0)
Platelets: 230 10*3/uL (ref 150–400)
RBC: 4.15 MIL/uL (ref 3.87–5.11)
RDW: 14.1 % (ref 11.5–15.5)
WBC: 3.8 10*3/uL — ABNORMAL LOW (ref 4.0–10.5)
nRBC: 0 % (ref 0.0–0.2)

## 2020-03-11 ENCOUNTER — Other Ambulatory Visit: Payer: Self-pay

## 2020-03-11 ENCOUNTER — Other Ambulatory Visit (HOSPITAL_COMMUNITY)
Admission: RE | Admit: 2020-03-11 | Discharge: 2020-03-11 | Disposition: A | Discharge status: Home / Self Care | Payer: BC Managed Care – PPO | Source: Ambulatory Visit | Attending: Obstetrics & Gynecology | Admitting: Obstetrics & Gynecology

## 2020-03-11 DIAGNOSIS — Z800 Family history of malignant neoplasm of digestive organs: Secondary | ICD-10-CM | POA: Diagnosis not present

## 2020-03-11 DIAGNOSIS — N840 Polyp of corpus uteri: Secondary | ICD-10-CM | POA: Diagnosis not present

## 2020-03-11 DIAGNOSIS — D250 Submucous leiomyoma of uterus: Secondary | ICD-10-CM | POA: Diagnosis not present

## 2020-03-11 DIAGNOSIS — Z853 Personal history of malignant neoplasm of breast: Secondary | ICD-10-CM | POA: Diagnosis not present

## 2020-03-11 DIAGNOSIS — I10 Essential (primary) hypertension: Secondary | ICD-10-CM | POA: Diagnosis not present

## 2020-03-11 DIAGNOSIS — N841 Polyp of cervix uteri: Secondary | ICD-10-CM | POA: Diagnosis not present

## 2020-03-11 DIAGNOSIS — R1909 Other intra-abdominal and pelvic swelling, mass and lump: Secondary | ICD-10-CM | POA: Diagnosis not present

## 2020-03-11 DIAGNOSIS — Z01812 Encounter for preprocedural laboratory examination: Secondary | ICD-10-CM | POA: Insufficient documentation

## 2020-03-11 DIAGNOSIS — Z85528 Personal history of other malignant neoplasm of kidney: Secondary | ICD-10-CM | POA: Diagnosis not present

## 2020-03-11 DIAGNOSIS — Z20822 Contact with and (suspected) exposure to covid-19: Secondary | ICD-10-CM | POA: Insufficient documentation

## 2020-03-11 DIAGNOSIS — Z79899 Other long term (current) drug therapy: Secondary | ICD-10-CM | POA: Diagnosis not present

## 2020-03-11 DIAGNOSIS — Z8051 Family history of malignant neoplasm of kidney: Secondary | ICD-10-CM | POA: Diagnosis not present

## 2020-03-11 LAB — SARS CORONAVIRUS 2 (TAT 6-24 HRS): SARS Coronavirus 2: NEGATIVE

## 2020-03-11 NOTE — H&P (Signed)
Preoperative History and Physical  Jennifer Harvey is a 59 y.o. G4P0 with No LMP recorded. Patient is postmenopausal. admitted for a hysteroscopy uterine curettage with removal of endometrial mass.  Found incidentally on a CT scan of abdomen and pelvis asymptomatic  PMH:    Past Medical History:  Diagnosis Date  . Breast cancer ([place])   . Cancer [facility])    renal cell carcinoma 2007 s/p partial right nephrectomy   . History of kidney cancer   . Hypertension   . Sleep apnea     PSH:     Past Surgical History:  Procedure Laterality Date  . BREAST SURGERY    . CESAREAN SECTION  1994    POb/GynH:      OB History    Gravida  4   Para      Term      Preterm      AB      Living  3     SAB      IAB      Ectopic      Multiple      Live Births  3           SH:   Social History   Tobacco Use  . Smoking status: Never Smoker  . Smokeless tobacco: Never Used  Vaping Use  . Vaping Use: Never used  Substance Use Topics  . Alcohol use: Yes    Alcohol/week: 2.0 standard drinks    Types: 2 Cans of beer per week    Comment: 2 beers per day  . Drug use: Not Currently    FH:    Family History  Problem Relation Age of Onset  . Cancer Maternal Grandmother        stomach  . Cancer Maternal Grandfather        kidney cancer  . Other Father        auto accident     Allergies: No Known Allergies  Medications:      No current facility-administered medications for this encounter.  Current Outpatient Medications:  .  amLODipine (NORVASC) 5 MG tablet, Take 5 mg by mouth daily. , Disp: , Rfl:  .  benazepril-hydrochlorthiazide (LOTENSIN HCT) 5-6.25 MG tablet, Take 1 tablet by mouth daily., Disp: , Rfl:  .  citalopram (CELEXA) 20 MG tablet, Take 20 mg by mouth daily., Disp: , Rfl:  .  famotidine (PEPCID) 20 MG tablet, Take 20 mg by mouth 2 (two) times daily., Disp: , Rfl:  .  omeprazole (PRILOSEC) 40 MG capsule, Take 40 mg by mouth daily as needed for  heartburn., Disp: , Rfl:  .  tamoxifen (NOLVADEX) 20 MG tablet, Take 20 mg by mouth daily., Disp: , Rfl:  .  dicyclomine (BENTYL) 20 MG tablet, Take 1 tablet (20 mg total) by mouth 2 (two) times daily. (Patient not taking: No sig reported), Disp: 20 tablet, Rfl: 0 .  gabapentin (NEURONTIN) 100 MG capsule, Take 100 mg by mouth in the morning and at bedtime., Disp: , Rfl:  .  ondansetron (ZOFRAN) 4 MG tablet, Take 1 tablet (4 mg total) by mouth every 6 (six) hours. (Patient not taking: Reported on 03/04/2020), Disp: 12 tablet, Rfl: 0  Review of Systems:   Review of Systems  Constitutional: Negative for fever, chills, weight loss, malaise/fatigue and diaphoresis.  HENT: Negative for hearing loss, ear pain, nosebleeds, congestion, sore throat, neck pain, tinnitus and ear discharge.   Eyes: Negative for blurred vision, double vision,  photophobia, pain, discharge and redness.  Respiratory: Negative for cough, hemoptysis, sputum production, shortness of breath, wheezing and stridor.   Cardiovascular: Negative for chest pain, palpitations, orthopnea, claudication, leg swelling and PND.  Gastrointestinal: Positive for abdominal pain. Negative for heartburn, nausea, vomiting, diarrhea, constipation, blood in stool and melena.  Genitourinary: Negative for dysuria, urgency, frequency, hematuria and flank pain.  Musculoskeletal: Negative for myalgias, back pain, joint pain and falls.  Skin: Negative for itching and rash.  Neurological: Negative for dizziness, tingling, tremors, sensory change, speech change, focal weakness, seizures, loss of consciousness, weakness and headaches.  Endo/Heme/Allergies: Negative for environmental allergies and polydipsia. Does not bruise/bleed easily.  Psychiatric/Behavioral: Negative for depression, suicidal ideas, hallucinations, memory loss and substance abuse. The patient is not nervous/anxious and does not have insomnia.      PHYSICAL EXAM:  There were no vitals  taken for this visit.    Vitals reviewed. Constitutional: She is oriented to person, place, and time. She appears well-developed and well-nourished.  HENT:  Head: Normocephalic and atraumatic.  Right Ear: External ear normal.  Left Ear: External ear normal.  Nose: Nose normal.  Mouth/Throat: Oropharynx is clear and moist.  Eyes: Conjunctivae and EOM are normal. Pupils are equal, round, and reactive to light. Right eye exhibits no discharge. Left eye exhibits no discharge. No scleral icterus.  Neck: Normal range of motion. Neck supple. No tracheal deviation present. No thyromegaly present.  Cardiovascular: Normal rate, regular rhythm, normal heart sounds and intact distal pulses.  Exam reveals no gallop and no friction rub.   No murmur heard. Respiratory: Effort normal and breath sounds normal. No respiratory distress. She has no wheezes. She has no rales. She exhibits no tenderness.  GI: Soft. Bowel sounds are normal. She exhibits no distension and no mass. There is tenderness. There is no rebound and no guarding.  Genitourinary:       Vulva is normal without lesions Vagina is pink moist without discharge Cervix normal in appearance and pap is normal Uterus is normal size, contour, position, consistency, mobility, non-tender Adnexa is negative with normal sized ovaries by sonogram  Musculoskeletal: Normal range of motion. She exhibits no edema and no tenderness.  Neurological: She is alert and oriented to person, place, and time. She has normal reflexes. She displays normal reflexes. No cranial nerve deficit. She exhibits normal muscle tone. Coordination normal.  Skin: Skin is warm and dry. No rash noted. No erythema. No pallor.  Psychiatric: She has a normal mood and affect. Her behavior is normal. Judgment and thought content normal.    Labs: Results for orders placed or performed during the hospital encounter of 03/05/20 (from the past 336 hour(s))  CBC   Collection Time: 03/05/20   2:31 PM  Result Value Ref Range   WBC 3.8 (L) 4.0 - 10.5 K/uL   RBC 4.15 3.87 - 5.11 MIL/uL   Hemoglobin 12.3 12.0 - 15.0 g/dL   HCT 37.2 36.0 - 46.0 %   MCV 89.6 80.0 - 100.0 fL   MCH 29.6 26.0 - 34.0 pg   MCHC 33.1 30.0 - 36.0 g/dL   RDW 14.1 11.5 - 15.5 %   Platelets 230 150 - 400 K/uL   nRBC 0.0 0.0 - 0.2 %  Comprehensive metabolic panel   Collection Time: 03/05/20  2:31 PM  Result Value Ref Range   Sodium 139 135 - 145 mmol/L   Potassium 3.8 3.5 - 5.1 mmol/L   Chloride 103 98 - 111 mmol/L   CO2  25 22 - 32 mmol/L   Glucose, Bld 91 70 - 99 mg/dL   BUN 12 6 - 20 mg/dL   Creatinine, Ser 3.42 0.44 - 1.00 mg/dL   Calcium 9.2 8.9 - 87.6 mg/dL   Total Protein 6.7 6.5 - 8.1 g/dL   Albumin 3.8 3.5 - 5.0 g/dL   AST 22 15 - 41 U/L   ALT 19 0 - 44 U/L   Alkaline Phosphatase 74 38 - 126 U/L   Total Bilirubin 0.4 0.3 - 1.2 mg/dL   GFR, Estimated >81 >15 mL/min   Anion gap 11 5 - 15  Rapid HIV screen (HIV 1/2 Ab+Ag)   Collection Time: 03/05/20  2:31 PM  Result Value Ref Range   HIV-1 P24 Antigen - HIV24 NON REACTIVE NON REACTIVE   HIV 1/2 Antibodies NON REACTIVE NON REACTIVE   Interpretation (HIV Ag Ab)      A non reactive test result means that HIV 1 or HIV 2 antibodies and HIV 1 p24 antigen were not detected in the specimen.    EKG: Orders placed or performed during the hospital encounter of 03/05/20  . EKG 12-Lead  . EKG 12-Lead    Imaging Studies: GYNECOLOGIC SONOGRAM   Jennifer Harvey is a 59 y.o. G4P0 No LMP recorded. Patient is postmenopausal. She is here for a pelvic sonogram for endometrial mass seen on CT,HX of breast and renal cancer.  Uterus                      8.13 x 5.9 x 7.7 cm, Total uterine volume 192 Cc, heterogeneous anteverted uterus with hypoechoic linear striations (? adenomyosis),mult small fibroids (#1) posterior LUS intramural fibroid 1.4 x 1.4 x  1.6 cm(#2) anterior mid intramural fibroid .9 x .6 x 1 cm  Endometrium          14.6 mm,  symmetrical, heterogenous thickened endometrium with a vascular hypoechoic endometrial mass 2.2 x 1.7 x 2 cm  Right ovary             2.3 x 1.5 x 2.3 cm, wnl  Left ovary                3 x 1.8 x 2.9 cm, wnl  No free fluid   Technician Comments:  PELVIC US TA/TV: heterogeneous anteverted uterus with hypoechoic linear striations (? adenomyosis),mult small fibroids (#1) posterior LUS intramural fibroid 1.4 x 1.4 x  1.6 cm(#2) anterior mid intramural fibroid .9 x .6 x 1 cm,heterogenous thickened endometrium with a vascular hypoechoic endometrial mass 2.2 x 1.7 x 2 cm,EEC 14.6 mm,normal ovaries,no free fluid,no pain during ultrasound   Chaperone [address] Flora Lipps 01/22/2020 5:12 PM  Clinical Impression and recommendations:  I have reviewed the sonogram results above, combined with the patient's current clinical course, below are my impressions and any appropriate recommendations for management based on the sonographic findings.  Uterus is upper limits of normal, especially for a postmenopausal woman, with appearance of adenomyosis, and small myomas Endometrium has a 2 cm mass, appearance most likely a submucosal myoma Both ovaries are normal size shape and morphology  Clinical recommendations: Endometrial mass most likely fibroid, consider follow up scan 6 months vs hysteroscopically directed removal   Lazaro Arms 02/01/2020 7:44 PM    Assessment: Endometrial mass, favor myoma vs polyp Thickened endometrium in additon to endometrial mass  Plan: Hysteroscopy uterine curettage with removal of endometrial mass  Lazaro Arms 03/11/2020 11:21  AM

## 2020-03-12 ENCOUNTER — Encounter (HOSPITAL_COMMUNITY)
Admission: RE | Disposition: A | Discharge status: Home / Self Care | Payer: Self-pay | Source: Home / Self Care | Attending: Obstetrics & Gynecology

## 2020-03-12 ENCOUNTER — Ambulatory Visit (HOSPITAL_COMMUNITY)
Admission: RE | Admit: 2020-03-12 | Discharge: 2020-03-12 | Disposition: A | Discharge status: Home / Self Care | Payer: BC Managed Care – PPO | Attending: Obstetrics & Gynecology | Admitting: Obstetrics & Gynecology

## 2020-03-12 ENCOUNTER — Encounter (HOSPITAL_COMMUNITY): Payer: Self-pay | Admitting: Obstetrics & Gynecology

## 2020-03-12 ENCOUNTER — Ambulatory Visit (HOSPITAL_COMMUNITY): Payer: BC Managed Care – PPO

## 2020-03-12 DIAGNOSIS — D250 Submucous leiomyoma of uterus: Secondary | ICD-10-CM | POA: Insufficient documentation

## 2020-03-12 DIAGNOSIS — R9389 Abnormal findings on diagnostic imaging of other specified body structures: Secondary | ICD-10-CM | POA: Diagnosis not present

## 2020-03-12 DIAGNOSIS — N840 Polyp of corpus uteri: Secondary | ICD-10-CM | POA: Insufficient documentation

## 2020-03-12 DIAGNOSIS — Z79899 Other long term (current) drug therapy: Secondary | ICD-10-CM | POA: Diagnosis not present

## 2020-03-12 DIAGNOSIS — Z853 Personal history of malignant neoplasm of breast: Secondary | ICD-10-CM | POA: Insufficient documentation

## 2020-03-12 DIAGNOSIS — N841 Polyp of cervix uteri: Secondary | ICD-10-CM | POA: Insufficient documentation

## 2020-03-12 DIAGNOSIS — R1909 Other intra-abdominal and pelvic swelling, mass and lump: Secondary | ICD-10-CM | POA: Diagnosis not present

## 2020-03-12 DIAGNOSIS — I10 Essential (primary) hypertension: Secondary | ICD-10-CM | POA: Insufficient documentation

## 2020-03-12 DIAGNOSIS — N9489 Other specified conditions associated with female genital organs and menstrual cycle: Secondary | ICD-10-CM

## 2020-03-12 DIAGNOSIS — Z20822 Contact with and (suspected) exposure to covid-19: Secondary | ICD-10-CM | POA: Diagnosis not present

## 2020-03-12 DIAGNOSIS — Z8051 Family history of malignant neoplasm of kidney: Secondary | ICD-10-CM | POA: Insufficient documentation

## 2020-03-12 DIAGNOSIS — Z85528 Personal history of other malignant neoplasm of kidney: Secondary | ICD-10-CM | POA: Diagnosis not present

## 2020-03-12 DIAGNOSIS — Z800 Family history of malignant neoplasm of digestive organs: Secondary | ICD-10-CM | POA: Diagnosis not present

## 2020-03-12 HISTORY — PX: HYSTEROSCOPY WITH D & C: SHX1775

## 2020-03-12 SURGERY — DILATATION AND CURETTAGE /HYSTEROSCOPY
Anesthesia: General

## 2020-03-12 MED ORDER — FENTANYL CITRATE (PF) 100 MCG/2ML IJ SOLN
25.0000 ug | INTRAMUSCULAR | Status: DC | PRN
Start: 2020-03-12 — End: 2020-03-12

## 2020-03-12 MED ORDER — ORAL CARE MOUTH RINSE: 15.0000 mL | Freq: Once | OROMUCOSAL | Status: AC

## 2020-03-12 MED ORDER — PROPOFOL 10 MG/ML IV BOLUS
INTRAVENOUS | Status: DC | PRN
  Administered 2020-03-12: 200 mg via INTRAVENOUS

## 2020-03-12 MED ORDER — 0.9 % SODIUM CHLORIDE (POUR BTL) OPTIME
TOPICAL | Status: DC | PRN
  Administered 2020-03-12: 08:00:00 1000 mL

## 2020-03-12 MED ORDER — KETOROLAC TROMETHAMINE 10 MG PO TABS: 10.0000 mg | ORAL_TABLET | Freq: Three times a day (TID) | ORAL | 0 refills | Status: DC | PRN

## 2020-03-12 MED ORDER — LIDOCAINE HCL (PF) 2 % IJ SOLN
INTRAMUSCULAR | Status: AC
  Filled 2020-03-12: qty 5

## 2020-03-12 MED ORDER — FENTANYL CITRATE (PF) 100 MCG/2ML IJ SOLN
INTRAMUSCULAR | Status: AC
  Filled 2020-03-12: qty 2

## 2020-03-12 MED ORDER — CHLORHEXIDINE GLUCONATE 0.12 % MT SOLN
OROMUCOSAL | Status: AC
  Filled 2020-03-12: qty 15

## 2020-03-12 MED ORDER — EPHEDRINE SULFATE 50 MG/ML IJ SOLN
INTRAMUSCULAR | Status: DC | PRN
  Administered 2020-03-12 (×3): 10 mg via INTRAVENOUS

## 2020-03-12 MED ORDER — CEFAZOLIN SODIUM-DEXTROSE 2-4 GM/100ML-% IV SOLN
2.0000 g | INTRAVENOUS | Status: AC
  Administered 2020-03-12: 08:00:00 2 g via INTRAVENOUS
  Filled 2020-03-12: qty 100

## 2020-03-12 MED ORDER — FENTANYL CITRATE (PF) 250 MCG/5ML IJ SOLN
INTRAMUSCULAR | Status: DC | PRN
  Administered 2020-03-12: 50 ug via INTRAVENOUS
  Administered 2020-03-12 (×2): 25 ug via INTRAVENOUS

## 2020-03-12 MED ORDER — KETOROLAC TROMETHAMINE 30 MG/ML IJ SOLN
30.0000 mg | Freq: Once | INTRAMUSCULAR | Status: AC
  Administered 2020-03-12: 07:00:00 30 mg via INTRAVENOUS
  Filled 2020-03-12: qty 1

## 2020-03-12 MED ORDER — LIDOCAINE HCL (CARDIAC) PF 100 MG/5ML IV SOSY
PREFILLED_SYRINGE | INTRAVENOUS | Status: DC | PRN
  Administered 2020-03-12: 50 mg via INTRAVENOUS

## 2020-03-12 MED ORDER — PROPOFOL 10 MG/ML IV BOLUS
INTRAVENOUS | Status: AC
  Filled 2020-03-12: qty 20

## 2020-03-12 MED ORDER — ONDANSETRON HCL 4 MG/2ML IJ SOLN: 4.0000 mg | Freq: Once | INTRAMUSCULAR | Status: DC | PRN

## 2020-03-12 MED ORDER — DEXAMETHASONE SODIUM PHOSPHATE 10 MG/ML IJ SOLN
INTRAMUSCULAR | Status: AC
  Filled 2020-03-12: qty 1

## 2020-03-12 MED ORDER — ONDANSETRON 8 MG PO TBDP: 8.0000 mg | ORAL_TABLET | Freq: Three times a day (TID) | ORAL | 0 refills | Status: DC | PRN

## 2020-03-12 MED ORDER — SODIUM CHLORIDE 0.9 % IR SOLN
Status: DC | PRN
  Administered 2020-03-12: 1000 mL

## 2020-03-12 MED ORDER — CHLORHEXIDINE GLUCONATE 0.12 % MT SOLN
15.0000 mL | Freq: Once | OROMUCOSAL | Status: AC
  Administered 2020-03-12: 07:00:00 15 mL via OROMUCOSAL

## 2020-03-12 MED ORDER — ONDANSETRON HCL 4 MG/2ML IJ SOLN
INTRAMUSCULAR | Status: AC
  Filled 2020-03-12: qty 2

## 2020-03-12 MED ORDER — MIDAZOLAM HCL 2 MG/2ML IJ SOLN
INTRAMUSCULAR | Status: DC | PRN
  Administered 2020-03-12: 2 mg via INTRAVENOUS

## 2020-03-12 MED ORDER — ONDANSETRON HCL 4 MG/2ML IJ SOLN
INTRAMUSCULAR | Status: DC | PRN
  Administered 2020-03-12: 4 mg via INTRAVENOUS

## 2020-03-12 MED ORDER — LACTATED RINGERS IV SOLN: INTRAVENOUS | Status: DC

## 2020-03-12 MED ORDER — POVIDONE-IODINE 10 % EX SWAB: 2.0000 "application " | Freq: Once | CUTANEOUS | Status: DC

## 2020-03-12 MED ORDER — DEXAMETHASONE SODIUM PHOSPHATE 10 MG/ML IJ SOLN
INTRAMUSCULAR | Status: DC | PRN
  Administered 2020-03-12: 5 mg via INTRAVENOUS

## 2020-03-12 MED ORDER — MIDAZOLAM HCL 2 MG/2ML IJ SOLN
INTRAMUSCULAR | Status: AC
  Filled 2020-03-12: qty 2

## 2020-03-12 SURGICAL SUPPLY — 27 items
BAG HAMPER (MISCELLANEOUS) ×2 IMPLANT
CLOTH BEACON ORANGE TIMEOUT ST (SAFETY) ×2 IMPLANT
COVER LIGHT HANDLE STERIS (MISCELLANEOUS) ×4 IMPLANT
COVER WAND RF STERILE (DRAPES) ×4 IMPLANT
GAUZE 4X4 16PLY RFD (DISPOSABLE) ×4 IMPLANT
GLOVE BIOGEL PI IND STRL 7.0 (GLOVE) ×2 IMPLANT
GLOVE BIOGEL PI IND STRL 8 (GLOVE) ×1 IMPLANT
GLOVE BIOGEL PI INDICATOR 7.0 (GLOVE) ×2
GLOVE BIOGEL PI INDICATOR 8 (GLOVE) ×1
GLOVE ECLIPSE 8.0 STRL XLNG CF (GLOVE) ×2 IMPLANT
GOWN STRL REUS W/TWL LRG LVL3 (GOWN DISPOSABLE) ×2 IMPLANT
GOWN STRL REUS W/TWL XL LVL3 (GOWN DISPOSABLE) ×2 IMPLANT
INST SET HYSTEROSCOPY (KITS) ×2 IMPLANT
IV NS 1000ML (IV SOLUTION) ×2
IV NS 1000ML BAXH (IV SOLUTION) ×1 IMPLANT
KIT TURNOVER CYSTO (KITS) ×2 IMPLANT
MANIFOLD NEPTUNE II (INSTRUMENTS) ×2 IMPLANT
MARKER SKIN DUAL TIP RULER LAB (MISCELLANEOUS) ×2 IMPLANT
NS IRRIG 1000ML POUR BTL (IV SOLUTION) ×2 IMPLANT
PACK BASIC III (CUSTOM PROCEDURE TRAY) ×2
PACK SRG BSC III STRL LF ECLPS (CUSTOM PROCEDURE TRAY) ×1 IMPLANT
PAD ARMBOARD 7.5X6 YLW CONV (MISCELLANEOUS) ×2 IMPLANT
PAD TELFA 3X4 1S STER (GAUZE/BANDAGES/DRESSINGS) ×2 IMPLANT
SET BASIN LINEN APH (SET/KITS/TRAYS/PACK) ×2 IMPLANT
SET IRRIG Y TYPE TUR BLADDER L (SET/KITS/TRAYS/PACK) ×2 IMPLANT
SHEET LAVH (DRAPES) ×2 IMPLANT
YANKAUER SUCT BULB TIP 10FT TU (MISCELLANEOUS) ×2 IMPLANT

## 2020-03-12 NOTE — Interval H&P Note (Signed)
History and Physical Interval Note:  03/12/2020 7:19 AM  Jennifer Harvey  has presented today for surgery, with the diagnosis of Endomertrial Mass and thickened Endometrium.  The various methods of treatment have been discussed with the patient and family. After consideration of risks, benefits and other options for treatment, the patient has consented to  Procedure(s): Hysteroscopy Uterine Curettage Removal of endometrial mass (N/A) as a surgical intervention.  The patient's history has been reviewed, patient examined, no change in status, stable for surgery.  I have reviewed the patient's chart and labs.  Questions were answered to the patient's satisfaction.     Lazaro Arms

## 2020-03-12 NOTE — Discharge Instructions (Signed)
Dilation and Curettage or Vacuum Curettage, Care After These instructions give you information about caring for yourself after your procedure. Your doctor may also give you more specific instructions. Call your doctor if you have any problems or questions after your procedure. Follow these instructions at home: Activity  Do not drive or use heavy machinery while taking prescription pain medicine.  For 24 hours after your procedure, avoid driving.  Take short walks often, followed by rest periods. Ask your doctor what activities are safe for you. After one or two days, you may be able to return to your normal activities.  Do not lift anything that is heavier than 10 lb (4.5 kg) until your doctor approves.  For at least 2 weeks, or as long as told by your doctor: ? Do not douche. ? Do not use tampons. ? Do not have sex. General instructions   Take over-the-counter and prescription medicines only as told by your doctor. This is very important if you take blood thinning medicine.  Do not take baths, swim, or use a hot tub until your doctor approves. Take showers instead of baths.  Wear compression stockings as told by your doctor.  It is up to you to get the results of your procedure. Ask your doctor when your results will be ready.  Keep all follow-up visits as told by your doctor. This is important. Contact a doctor if:  You have very bad cramps that get worse or do not get better with medicine.  You have very bad pain in your belly (abdomen).  You cannot drink fluids without throwing up (vomiting).  You get pain in a different part of the area between your belly and thighs (pelvis).  You have bad-smelling discharge from your vagina.  You have a rash. Get help right away if:  You are bleeding a lot from your vagina. A lot of bleeding means soaking more than one sanitary pad in an hour, for 2 hours in a row.  You have clumps of blood (blood clots) coming from your  vagina.  You have a fever or chills.  Your belly feels very tender or hard.  You have chest pain.  You have trouble breathing.  You cough up blood.  You feel dizzy.  You feel light-headed.  You pass out (faint).  You have pain in your neck or shoulder area. Summary  Take short walks often, followed by rest periods. Ask your doctor what activities are safe for you. After one or two days, you may be able to return to your normal activities.  Do not lift anything that is heavier than 10 lb (4.5 kg) until your doctor approves.  Do not take baths, swim, or use a hot tub until your doctor approves. Take showers instead of baths.  Contact your doctor if you have any symptoms of infection, like bad-smelling discharge from your vagina. This information is not intended to replace advice given to you by your health care provider. Make sure you discuss any questions you have with your health care provider. Document Revised: 02/11/2017 Document Reviewed: 11/17/2015 Elsevier Patient Education  2020 Else[employer] Inc.     General Anesthesia, Adult, Care After This sheet gives you information about how to care for yourself after your procedure. Your health care provider may also give you more specific instructions. If you have problems or questions, contact your health care provider. What can I expect after the procedure? After the procedure, the following side effects are common:  Pain or  discomfort at the IV site.  Nausea.  Vomiting.  Sore throat.  Trouble concentrating.  Feeling cold or chills.  Weak or tired.  Sleepiness and fatigue.  Soreness and body aches. These side effects can affect parts of the body that were not involved in surgery. Follow these instructions at home:  For at least 24 hours after the procedure:  Have a responsible adult stay with you. It is important to have someone help care for you until you are awake and alert.  Rest as needed.  Do  not: ? Participate in activities in which you could fall or become injured. ? Drive. ? Use heavy machinery. ? Drink alcohol. ? Take sleeping pills or medicines that cause drowsiness. ? Make important decisions or sign legal documents. ? Take care of children on your own. Eating and drinking  Follow any instructions from your health care provider about eating or drinking restrictions.  When you feel hungry, start by eating small amounts of foods that are soft and easy to digest (bland), such as toast. Gradually return to your regular diet.  Drink enough fluid to keep your urine pale yellow.  If you vomit, rehydrate by drinking water, juice, or clear broth. General instructions  If you have sleep apnea, surgery and certain medicines can increase your risk for breathing problems. Follow instructions from your health care provider about wearing your sleep device: ? Anytime you are sleeping, including during daytime naps. ? While taking prescription pain medicines, sleeping medicines, or medicines that make you drowsy.  Return to your normal activities as told by your health care provider. Ask your health care provider what activities are safe for you.  Take over-the-counter and prescription medicines only as told by your health care provider.  If you smoke, do not smoke without supervision.  Keep all follow-up visits as told by your health care provider. This is important. Contact a health care provider if:  You have nausea or vomiting that does not get better with medicine.  You cannot eat or drink without vomiting.  You have pain that does not get better with medicine.  You are unable to pass urine.  You develop a skin rash.  You have a fever.  You have redness around your IV site that gets worse. Get help right away if:  You have difficulty breathing.  You have chest pain.  You have blood in your urine or stool, or you vomit blood. Summary  After the procedure, it  is common to have a sore throat or nausea. It is also common to feel tired.  Have a responsible adult stay with you for the first 24 hours after general anesthesia. It is important to have someone help care for you until you are awake and alert.  When you feel hungry, start by eating small amounts of foods that are soft and easy to digest (bland), such as toast. Gradually return to your regular diet.  Drink enough fluid to keep your urine pale yellow.  Return to your normal activities as told by your health care provider. Ask your health care provider what activities are safe for you. This information is not intended to replace advice given to you by your health care provider. Make sure you discuss any questions you have with your health care provider. Document Revised: 03/04/2017 Document Reviewed: 10/15/2016 Elsevier Patient Education  2020 Else[employer] Inc.   Ketorolac tablets What is this medicine? KETOROLAC (kee toe ROLE ak) is a non-steroidal anti-inflammatory drug (NSAID). It is  used for a short while to treat moderate to severe pain, including pain after surgery. It should not be used for more than 5 days. This medicine may be used for other purposes; ask your health care provider or pharmacist if you have questions. COMMON BRAND NAME(S): Toradol What should I tell my health care provider before I take this medicine? They need to know if you have any of these conditions:  bleeding disorders  cigarette smoker  coronary artery bypass graft (CABG) surgery within the past 2 weeks  drink more than 3 alcohol-containing drinks a day  heart disease  high blood pressure  history of stomach bleeding  kidney disease  liver disease  lung or breathing disease, like asthma  stomach or intestine problems  an unusual or allergic reaction to ketorolac, aspirin, other NSAIDs, other medicines, foods, dyes, or preservatives  pregnant or trying to get pregnant  breast-feeding How  should I use this medicine? Take this medicine by mouth with a full glass of water. Follow the directions on the prescription label. Take your medicine at regular intervals. Do not take your medicine more often than directed. Do not take more than the recommended dose. A special MedGuide will be given to you by the pharmacist with each prescription and refill. Be sure to read this information carefully each time. Talk to your pediatrician regarding the use of this medicine in children. While this drug may be prescribed for children as young as 36 years of age for selected conditions, precautions do apply. Patients over 39 years old may have a stronger reaction and need a smaller dose. Overdosage: If you think you have taken too much of this medicine contact a poison control center or emergency room at once. NOTE: This medicine is only for you. Do not share this medicine with others. What if I miss a dose? If you miss a dose, take it as soon as you can. If it is almost time for your next dose, take only that dose. Do not take double or extra doses. What may interact with this medicine? Do not take this medicine with any of the following medications:  aspirin and aspirin-like medicines  cidofovir  methotrexate  NSAIDs, medicines for pain and inflammation, like ibuprofen or naproxen  pemetrexed  probenecid This medicine may also interact with the following medications:  alcohol  alendronate  alprazolam  carbamazepine  cyclosporine  diuretics  flavocoxid  fluoxetine  ginkgo  lithium  medicines for high blood pressure like enalapril  medicines that affect platelets like pentoxifylline  medicines that treat or prevent blood clots like heparin, warfarin  muscle relaxants  phenytoin  steroid medicines like prednisone or cortisone  thiothixene This list may not describe all possible interactions. Give your health care provider a list of all the medicines, herbs,  non-prescription drugs, or dietary supplements you use. Also tell them if you smoke, drink alcohol, or use illegal drugs. Some items may interact with your medicine. What should I watch for while using this medicine? Tell your doctor or healthcare provider if your pain does not get better. Talk to your doctor before taking another medicine for pain. Do not treat yourself. This medicine may cause serious skin reactions. They can happen weeks to months after starting the medicine. Contact your healthcare provider right away if you notice fevers or flu-like symptoms with a rash. The rash may be red or purple and then turn into blisters or peeling of the skin. Or, you might notice a red rash  with swelling of the face, lips or lymph nodes in your neck or under your arms. This medicine does not prevent heart attack or stroke. In fact, this medicine may increase the chance of a heart attack or stroke. The chance may increase with longer use of this medicine and in people who have heart disease. If you take aspirin to prevent heart attack or stroke, talk with your doctor or healthcare provider. Do not take medicines such as ibuprofen and naproxen with this medicine. Side effects such as stomach upset, nausea, or ulcers may be more likely to occur. Many medicines available without a prescription should not be taken with this medicine. This medicine can cause ulcers and bleeding in the stomach and intestines at any time during treatment. Do not smoke cigarettes or drink alcohol. These increase irritation to your stomach and can make it more susceptible to damage from this medicine. Ulcers and bleeding can happen without warning symptoms and can cause death. You may get drowsy or dizzy. Do not drive, use machinery, or do anything that needs mental alertness until you know how this medicine affects you. Do not stand or sit up quickly, especially if you are an older patient. This reduces the risk of dizzy or fainting  spells. This medicine can cause you to bleed more easily. Try to avoid damage to your teeth and gums when you brush or floss your teeth. What side effects may I notice from receiving this medicine? Side effects that you should report to your doctor or health care professional as soon as possible:  allergic reactions like skin rash, itching or hives, swelling of the face, lips, or tongue  breathing problems  high blood pressure  nausea, vomiting  redness, blistering, peeling or loosening of the skin, including inside the mouth  severe stomach pain  signs and symptoms of bleeding such as bloody or black, tarry stools; red or dark-brown urine; spitting up blood or brown material that looks like coffee grounds; red spots on the skin; unusual bruising or bleeding from the eye, gums, or nose  signs and symptoms of a stroke like changes in vision; confusion; trouble speaking or understanding; severe headaches; sudden numbness or weakness of the face, arm or leg; trouble walking; dizziness; loss of balance or coordination  trouble passing urine or change in the amount of urine  unexplained weight gain or swelling  unusually weak or tired  yellowing of eyes or skin Side effects that usually do not require medical attention (report to your doctor or health care professional if they continue or are bothersome):  diarrhea  dizziness  headache  heartburn This list may not describe all possible side effects. Call your doctor for medical advice about side effects. You may report side effects to FDA at 1-800-FDA-1088. Where should I keep my medicine? Keep out of the reach of children. Store at room temperature between 20 and 25 degrees C (68 and 77 degrees F). Throw away any unused medicine after the expiration date. NOTE: This sheet is a summary. It may not cover all possible information. If you have questions about this medicine, talk to your doctor, pharmacist, or health care provider.   2020 Elsevier/Gold Standard (2018-05-17 15:15:50)   Ondansetron oral dissolving tablet What is this medicine? ONDANSETRON (on DAN se tron) is used to treat nausea and vomiting caused by chemotherapy. It is also used to prevent or treat nausea and vomiting after surgery. This medicine may be used for other purposes; ask your health care provider  or pharmacist if you have questions. COMMON BRAND NAME(S): Zofran ODT What should I tell my health care provider before I take this medicine? They need to know if you have any of these conditions:  heart disease  history of irregular heartbeat  liver disease  low levels of magnesium or potassium in the blood  an unusual or allergic reaction to ondansetron, granisetron, other medicines, foods, dyes, or preservatives  pregnant or trying to get pregnant  breast-feeding How should I use this medicine? These tablets are made to dissolve in the mouth. Do not try to push the tablet through the foil backing. With dry hands, peel away the foil backing and gently remove the tablet. Place the tablet in the mouth and allow it to dissolve, then swallow. While you may take these tablets with water, it is not necessary to do so. Talk to your pediatrician regarding the use of this medicine in children. Special care may be needed. Overdosage: If you think you have taken too much of this medicine contact a poison control center or emergency room at once. NOTE: This medicine is only for you. Do not share this medicine with others. What if I miss a dose? If you miss a dose, take it as soon as you can. If it is almost time for your next dose, take only that dose. Do not take double or extra doses. What may interact with this medicine? Do not take this medicine with any of the following medications:  apomorphine  certain medicines for fungal infections like fluconazole, itraconazole, ketoconazole, posaconazole,  voriconazole  cisapride  dronedarone  pimozide  thioridazine This medicine may also interact with the following medications:  carbamazepine  certain medicines for depression, anxiety, or psychotic disturbances  fentanyl  linezolid  MAOIs like Carbex, Eldepryl, Marplan, Nardil, and Parnate  methylene blue (injected into a vein)  other medicines that prolong the QT interval (cause an abnormal heart rhythm) like dofetilide, ziprasidone  phenytoin  rifampicin  tramadol This list may not describe all possible interactions. Give your health care provider a list of all the medicines, herbs, non-prescription drugs, or dietary supplements you use. Also tell them if you smoke, drink alcohol, or use illegal drugs. Some items may interact with your medicine. What should I watch for while using this medicine? Check with your doctor or health care professional as soon as you can if you have any sign of an allergic reaction. What side effects may I notice from receiving this medicine? Side effects that you should report to your doctor or health care professional as soon as possible:  allergic reactions like skin rash, itching or hives, swelling of the face, lips, or tongue  breathing problems  confusion  dizziness  fast or irregular heartbeat  feeling faint or lightheaded, falls  fever and chills  loss of balance or coordination  seizures  sweating  swelling of the hands and feet  tightness in the chest  tremors  unusually weak or tired Side effects that usually do not require medical attention (report to your doctor or health care professional if they continue or are bothersome):  constipation or diarrhea  headache This list may not describe all possible side effects. Call your doctor for medical advice about side effects. You may report side effects to FDA at 1-800-FDA-1088. Where should I keep my medicine? Keep out of the reach of children. Store between 2  and 30 degrees C (36 and 86 degrees F). Throw away any unused medicine after the  expiration date. NOTE: This sheet is a summary. It may not cover all possible information. If you have questions about this medicine, talk to your doctor, pharmacist, or health care provider.  2020 Elsevier/Gold Standard (2018-02-21 07:14:10)

## 2020-03-12 NOTE — Op Note (Signed)
Preoperative diagnosis: Endometrial mass, favor fibroid versus polyp  Postop diagnosis: Same as above, submucosal myoma, benign-appearing endometrium, small endocervical polyp  Procedure: Hysteroscopy with uterine curettage  Surgeon:Ciearra Rufo Lauretta Chester, MD  Anesthesia: Laryngeal mask airway  Findings: Patient had a incidental finding of a endometrial mass at the time of the CT scan She had been asymptomatic and having no bleeding She has a history of breast cancer and renal cell carcinoma The endometrium was also thickened at 14 mm The characteristics of the mass and sonogram were consistent with a submucosal myoma  This was confirmed today at the time of surgery Her endometrium appeared benign The mass was a broad-based submucosal myoma smooth coming off the left uterine sidewall  She did have a small endocervical polyp which was sent separately but findings were completely benign  Both specimens were sent to pathology for evaluation  Description of operation: Patient was taken to the operating room placed in the supine position where she underwent general tracheal anesthesia She was placed in low lithotomy position She was prepped and draped in the usual sterile fashion A Graves speculum was placed A single-tooth tenaculum was used to grasp the anterior cervix The endocervical polyp was identified and removed and sent to pathology for evaluation The cervix was dilated serially to allow passage of the hysteroscope Diagnostic hysteroscopy was performed and the endometrium appeared smooth and benign There was indeed a submucosal myoma and a very broad-based coming off the left endometrial sidewall This was not a polyp and was consistent with the findings on the sonogram Rounds not teardrop A vigorous uterine curettage was performed An attempt was made at removal of the fibroid but it was such a broad base it really was not amenable to hysteroscopic removal and again was completely  benign in appearance The patient also had other intramural fibroids present as well on sonogram  There was minimal bleeding  The patient was awakened from anesthesia and taken to recovery room in good stable condition All counts were correct x3  She received 2 g of Ancef and 30 mg of Toradol preoperatively prophylactically  She will be discharged to home and seen back in the office in approximately 1 week for postop visit  I fully expect all pathology specimens to be benign  Lazaro Arms, MD 03/12/2020 8:05 AM

## 2020-03-12 NOTE — Anesthesia Preprocedure Evaluation (Signed)
Anesthesia Evaluation  Patient identified by MRN, date of birth, ID band Patient awake    Reviewed: Allergy & Precautions, H&P , NPO status , Patient's Chart, lab work & pertinent test results, reviewed documented beta blocker date and time   Airway Mallampati: II  TM Distance: >3 FB Neck ROM: full    Dental no notable dental hx.    Pulmonary sleep apnea ,    Pulmonary exam normal breath sounds clear to auscultation       Cardiovascular Exercise Tolerance: Good hypertension, negative cardio ROS   Rhythm:regular Rate:Normal     Neuro/Psych negative neurological ROS  negative psych ROS   GI/Hepatic negative GI ROS, Neg liver ROS,   Endo/Other  negative endocrine ROS  Renal/GU negative Renal ROS  negative genitourinary   Musculoskeletal   Abdominal   Peds  Hematology negative hematology ROS (+)   Anesthesia Other Findings   Reproductive/Obstetrics negative OB ROS                             Anesthesia Physical Anesthesia Plan  ASA: II  Anesthesia Plan: General   Post-op Pain Management:    Induction:   PONV Risk Score and Plan: Ondansetron  Airway Management Planned:   Additional Equipment:   Intra-op Plan:   Post-operative Plan:   Informed Consent: I have reviewed the patients History and Physical, chart, labs and discussed the procedure including the risks, benefits and alternatives for the proposed anesthesia with the patient or authorized representative who has indicated his/her understanding and acceptance.     Dental Advisory Given  Plan Discussed with: CRNA  Anesthesia Plan Comments:         Anesthesia Quick Evaluation

## 2020-03-12 NOTE — Anesthesia Procedure Notes (Signed)
Procedure Name: LMA Insertion Date/Time: 03/12/2020 7:30 AM Performed by: Lorin Glass, CRNA Pre-anesthesia Checklist: Patient identified, Emergency Drugs available, Suction available and Patient being monitored Patient Re-evaluated:Patient Re-evaluated prior to induction Oxygen Delivery Method: Circle system utilized Preoxygenation: Pre-oxygenation with 100% oxygen Induction Type: IV induction Ventilation: Mask ventilation without difficulty LMA: LMA inserted LMA Size: 4.0 Number of attempts: 1 Tube secured with: Tape Dental Injury: Teeth and Oropharynx as per pre-operative assessment

## 2020-03-12 NOTE — Transfer of Care (Signed)
Immediate Anesthesia Transfer of Care Note  Patient: Jennifer Harvey  Procedure(s) Performed: Hysteroscopy Uterine Curettage (N/A )  Patient Location: PACU  Anesthesia Type:General  Level of Consciousness: awake  Airway & Oxygen Therapy: Patient Spontanous Breathing and Patient connected to nasal cannula oxygen  Post-op Assessment: Report given to RN and Post -op Vital signs reviewed and stable  Post vital signs: Reviewed and stable  Last Vitals:  Vitals Value Taken Time  BP 119/69 03/12/20 0812  Temp    Pulse 108 03/12/20 0814  Resp 17 03/12/20 0814  SpO2 97 % 03/12/20 0814  Vitals shown include unvalidated device data.  Last Pain:  Vitals:   03/12/20 0642  TempSrc: Oral  PainSc: 0-No pain      Patients Stated Pain Goal: 7 (03/12/20 0076)  Complications: No complications documented.

## 2020-03-12 NOTE — Anesthesia Postprocedure Evaluation (Signed)
Anesthesia Post Note  Patient: Jennifer Harvey  Procedure(s) Performed: Hysteroscopy Uterine Curettage (N/A )  Patient location during evaluation: PACU Anesthesia Type: General Level of consciousness: awake and oriented Pain management: pain level controlled Vital Signs Assessment: post-procedure vital signs reviewed and stable Respiratory status: spontaneous breathing Cardiovascular status: blood pressure returned to baseline and stable Postop Assessment: no apparent nausea or vomiting Anesthetic complications: no   No complications documented.   Last Vitals:  Vitals:   03/12/20 0900 03/12/20 0908  BP: 125/67 (!) 143/80  Pulse: 80 75  Resp: (!) 23 18  Temp:  36.6 C  SpO2: 96% 95%    Last Pain:  Vitals:   03/12/20 0908  TempSrc: Axillary  PainSc: 0-No pain                 Lorin Glass

## 2020-03-13 ENCOUNTER — Encounter (HOSPITAL_COMMUNITY): Payer: Self-pay | Admitting: Obstetrics & Gynecology

## 2020-03-16 ENCOUNTER — Other Ambulatory Visit: Payer: Self-pay

## 2020-03-16 ENCOUNTER — Emergency Department (HOSPITAL_COMMUNITY)
Admission: EM | Admit: 2020-03-16 | Discharge: 2020-03-16 | Disposition: A | Discharge status: Home / Self Care | Payer: BC Managed Care – PPO | Attending: Emergency Medicine | Admitting: Emergency Medicine

## 2020-03-16 ENCOUNTER — Encounter (HOSPITAL_COMMUNITY): Payer: Self-pay | Admitting: Emergency Medicine

## 2020-03-16 DIAGNOSIS — Z20822 Contact with and (suspected) exposure to covid-19: Secondary | ICD-10-CM | POA: Diagnosis not present

## 2020-03-16 DIAGNOSIS — Z853 Personal history of malignant neoplasm of breast: Secondary | ICD-10-CM | POA: Insufficient documentation

## 2020-03-16 DIAGNOSIS — R059 Cough, unspecified: Secondary | ICD-10-CM | POA: Diagnosis not present

## 2020-03-16 DIAGNOSIS — Z85528 Personal history of other malignant neoplasm of kidney: Secondary | ICD-10-CM | POA: Insufficient documentation

## 2020-03-16 DIAGNOSIS — Z79899 Other long term (current) drug therapy: Secondary | ICD-10-CM | POA: Diagnosis not present

## 2020-03-16 LAB — SARS CORONAVIRUS 2 (TAT 6-24 HRS): SARS Coronavirus 2: NEGATIVE

## 2020-03-16 LAB — POC SARS CORONAVIRUS 2 AG -  ED: SARS Coronavirus 2 Ag: NEGATIVE

## 2020-03-16 MED ORDER — BENZONATATE 100 MG PO CAPS: 100.0000 mg | ORAL_CAPSULE | Freq: Three times a day (TID) | ORAL | 0 refills | Status: DC

## 2020-03-16 MED ORDER — ALBUTEROL SULFATE HFA 108 (90 BASE) MCG/ACT IN AERS
2.0000 | INHALATION_SPRAY | Freq: Once | RESPIRATORY_TRACT | Status: AC
  Administered 2020-03-16: 2 via RESPIRATORY_TRACT
  Filled 2020-03-16: qty 6.7

## 2020-03-16 MED ORDER — BENZONATATE 100 MG PO CAPS
100.0000 mg | ORAL_CAPSULE | Freq: Once | ORAL | Status: AC
  Administered 2020-03-16: 100 mg via ORAL
  Filled 2020-03-16: qty 1

## 2020-03-16 NOTE — ED Triage Notes (Signed)
Pt c/o nasal congestion and cough for the past few days.

## 2020-03-16 NOTE — ED Notes (Signed)
Patient discharged to home.  All discharge instructions reviewed.  Patient verbalized understanding via teachback method.  Ambulatory out of ED.   °

## 2020-03-16 NOTE — ED Provider Notes (Signed)
[facility] EMERGENCY DEPARTMENT Provider Note   CSN: [id number] Arrival date & time: 03/16/20  0115     History Chief Complaint  Patient presents with  . Nasal Congestion    Jennifer Harvey is a 60 y.o. female.  The history is provided by the patient.  Cough Severity:  Moderate Onset quality:  Gradual Timing:  Intermittent Progression:  Worsening Chronicity:  New Relieved by:  Nothing Worsened by:  Nothing Associated symptoms: sinus congestion   Associated symptoms: no chest pain, no fever and no shortness of breath        Past Medical History:  Diagnosis Date  . Breast cancer (HCC)   . Cancer [facility])    renal cell carcinoma 2007 s/p partial right nephrectomy   . History of kidney cancer   . Hypertension   . Sleep apnea     Patient Active Problem List   Diagnosis Date Noted  . Malignant neoplasm of upper-inner quadrant of breast in female, estrogen receptor positive (HCC) 08/19/2017    Past Surgical History:  Procedure Laterality Date  . BREAST SURGERY    . CESAREAN SECTION  1994  . HYSTEROSCOPY WITH D & C N/A 03/12/2020   Procedure: Hysteroscopy Uterine Curettage;  Surgeon: Lazaro Arms, MD;  Location: [facility];  Service: Gynecology;  Laterality: N/A;     OB History    Gravida  4   Para      Term      Preterm      AB      Living  3     SAB      IAB      Ectopic      Multiple      Live Births  3           Family History  Problem Relation Age of Onset  . Cancer Maternal Grandmother        stomach  . Cancer Maternal Grandfather        kidney cancer  . Other Father        auto accident    Social History   Tobacco Use  . Smoking status: Never Smoker  . Smokeless tobacco: Never Used  Vaping Use  . Vaping Use: Never used  Substance Use Topics  . Alcohol use: Yes    Alcohol/week: 2.0 standard drinks    Types: 2 Cans of beer per week    Comment: 2 beers per day  . Drug use: Not Currently    Home Medications Prior  to Admission medications   Medication Sig Start Date End Date Taking? Authorizing Provider  benzonatate (TESSALON) 100 MG capsule Take 1 capsule (100 mg total) by mouth every 8 (eight) hours. 03/16/20  Yes Zadie Rhine, MD  amLODipine (NORVASC) 5 MG tablet Take 5 mg by mouth daily.  07/29/17   [provider]  benazepril-hydrochlorthiazide (LOTENSIN HCT) 5-6.25 MG tablet Take 1 tablet by mouth daily.    [provider]  citalopram (CELEXA) 20 MG tablet Take 20 mg by mouth daily. 02/15/20   [provider]  dicyclomine (BENTYL) 20 MG tablet Take 1 tablet (20 mg total) by mouth 2 (two) times daily. Patient not taking: No sig reported 12/16/19   Couture, Cortni S, PA-C  famotidine (PEPCID) 20 MG tablet Take 20 mg by mouth 2 (two) times daily. 10/09/19   [provider]  gabapentin (NEURONTIN) 100 MG capsule Take 100 mg by mouth in the morning and at bedtime. 10/09/19  [provider]  ketorolac (TORADOL) 10 MG tablet Take 1 tablet (10 mg total) by mouth every 8 (eight) hours as needed. 03/12/20   Florian Buff, MD  omeprazole (PRILOSEC) 40 MG capsule Take 40 mg by mouth daily as needed for heartburn. 12/19/19   [provider]  ondansetron (ZOFRAN ODT) 8 MG disintegrating tablet Take 1 tablet (8 mg total) by mouth every 8 (eight) hours as needed for nausea or vomiting. 03/12/20   Florian Buff, MD  ondansetron (ZOFRAN) 4 MG tablet Take 1 tablet (4 mg total) by mouth every 6 (six) hours. Patient not taking: Reported on 03/04/2020 12/16/19   Couture, Cortni S, PA-C  tamoxifen (NOLVADEX) 20 MG tablet Take 20 mg by mouth daily. 12/10/19   [provider]    Allergies    Patient has no known allergies.  Review of Systems   Review of Systems  Constitutional: Negative for fever.  Respiratory: Positive for cough. Negative for shortness of breath.   Cardiovascular: Negative for chest pain.  All other systems reviewed and are  negative.   Physical Exam Updated Vital Signs BP 127/78 (BP Location: Right Arm)   Pulse 84   Temp 98 F (36.7 C) (Oral)   Resp 18   Ht 1.676 m (5\' 6" )   Wt 85.7 kg   SpO2 98%   BMI 30.51 kg/m   Physical Exam CONSTITUTIONAL: Well developed/well nourished HEAD: Normocephalic/atraumatic EYES: EOMI/PERRL NECK: supple no meningeal signs SPINE/BACK:entire spine nontender CV: S1/S2 noted, no murmurs/rubs/gallops noted LUNGS: Lungs are clear to auscultation bilaterally, no apparent distress Cough during exam but otherwise unremarkable ABDOMEN: soft, nontender NEURO: Pt is awake/alert/appropriate, moves all extremitiesx4.  No facial droop.   EXTREMITIES: pulses normal/equal, full ROM SKIN: warm, color normal PSYCH: no abnormalities of mood noted, alert and oriented to situation ED Results / Procedures / Treatments   Labs (all labs ordered are listed, but only abnormal results are displayed) Labs Reviewed  SARS CORONAVIRUS 2 (TAT 6-24 HRS)  POC SARS CORONAVIRUS 2 AG -  ED    EKG None  Radiology No results found.  Procedures Procedures  Medications Ordered in ED Medications  albuterol (VENTOLIN HFA) 108 (90 Base) MCG/ACT inhaler 2 puff (2 puffs Inhalation Given 03/16/20 0259)  benzonatate (TESSALON) capsule 100 mg (100 mg Oral Given 03/16/20 0259)    ED Course  I have reviewed the triage vital signs and the nursing notes.  Pertinent labs results that were available during my care of the patient were reviewed by me and considered in my medical decision making (see chart for details).    MDM Rules/Calculators/A&P                          Jennifer Harvey was evaluated in Emergency Department on 03/16/2020 for the symptoms described in the history of present illness. She was evaluated in the context of the global COVID-19 pandemic, which necessitated consideration that the patient might be at risk for infection with the SARS-CoV-2 virus that causes COVID-19.  Institutional protocols and algorithms that pertain to the evaluation of patients at risk for COVID-19 are in a state of rapid change based on information released by regulatory bodies including the CDC and federal and state organizations. These policies and algorithms were followed during the patient's care in the ED.  Final Clinical Impression(s) / ED Diagnoses Final diagnoses:  Cough    Rx / DC Orders ED Discharge Orders  Ordered    benzonatate (TESSALON) 100 MG capsule  Every 8 hours        03/16/20 0250           Ripley Fraise, MD 03/16/20 [phone]

## 2020-03-17 LAB — SURGICAL PATHOLOGY

## 2020-03-24 ENCOUNTER — Ambulatory Visit (INDEPENDENT_AMBULATORY_CARE_PROVIDER_SITE_OTHER): Payer: BC Managed Care – PPO | Admitting: Obstetrics & Gynecology

## 2020-03-24 ENCOUNTER — Encounter: Payer: Self-pay | Admitting: Obstetrics & Gynecology

## 2020-03-24 ENCOUNTER — Other Ambulatory Visit: Payer: Self-pay

## 2020-03-24 VITALS — BP 113/65 | HR 71 | Ht 66.0 in | Wt 193.0 lb

## 2020-03-24 DIAGNOSIS — Z9889 Other specified postprocedural states: Secondary | ICD-10-CM

## 2020-03-24 NOTE — Progress Notes (Signed)
  HPI: Patient returns for routine postoperative follow-up having undergone hysteroscopy D&C on  03/12/20 .  The patient's immediate postoperative recovery has been unremarkable. Since hospital discharge the patient reports no problems.   Current Outpatient Medications: amLODipine (NORVASC) 5 MG tablet, Take 5 mg by mouth daily. , Disp: , Rfl:  benazepril-hydrochlorthiazide (LOTENSIN HCT) 5-6.25 MG tablet, Take 1 tablet by mouth daily., Disp: , Rfl:  citalopram (CELEXA) 20 MG tablet, Take 20 mg by mouth daily., Disp: , Rfl:  famotidine (PEPCID) 20 MG tablet, Take 20 mg by mouth 2 (two) times daily., Disp: , Rfl:  gabapentin (NEURONTIN) 100 MG capsule, Take 100 mg by mouth in the morning and at bedtime., Disp: , Rfl:  tamoxifen (NOLVADEX) 20 MG tablet, Take 20 mg by mouth daily., Disp: , Rfl:   benzonatate (TESSALON) 100 MG capsule, Take 1 capsule (100 mg total) by mouth every 8 (eight) hours. (Patient not taking: Reported on 03/24/2020), Disp: 21 capsule, Rfl: 0 dicyclomine (BENTYL) 20 MG tablet, Take 1 tablet (20 mg total) by mouth 2 (two) times daily. (Patient not taking: No sig reported), Disp: 20 tablet, Rfl: 0 ketorolac (TORADOL) 10 MG tablet, Take 1 tablet (10 mg total) by mouth every 8 (eight) hours as needed. (Patient not taking: Reported on 03/24/2020), Disp: 15 tablet, Rfl: 0 omeprazole (PRILOSEC) 40 MG capsule, Take 40 mg by mouth daily as needed for heartburn. (Patient not taking: Reported on 03/24/2020), Disp: , Rfl:  ondansetron (ZOFRAN ODT) 8 MG disintegrating tablet, Take 1 tablet (8 mg total) by mouth every 8 (eight) hours as needed for nausea or vomiting. (Patient not taking: Reported on 03/24/2020), Disp: 8 tablet, Rfl: 0 ondansetron (ZOFRAN) 4 MG tablet, Take 1 tablet (4 mg total) by mouth every 6 (six) hours. (Patient not taking: No sig reported), Disp: 12 tablet, Rfl: 0  No current facility-administered medications for this visit.    Blood pressure 113/65, pulse 71, height  5\' 6"  (1.676 m), weight 193 lb (87.5 kg).  Physical Exam: Incision clean dry intact  Diagnostic Tests:   Pathology: Benign endocervical endometrial polyps  Impression: S/p hysteroscopy uterine curettage for removal of polyps    Plan: Mo follow up needed    Follow up: prn   Florian Buff, MD

## 2020-03-25 DIAGNOSIS — G4733 Obstructive sleep apnea (adult) (pediatric): Secondary | ICD-10-CM | POA: Diagnosis not present

## 2020-03-26 DIAGNOSIS — G4733 Obstructive sleep apnea (adult) (pediatric): Secondary | ICD-10-CM | POA: Diagnosis not present

## 2020-04-25 DIAGNOSIS — G4733 Obstructive sleep apnea (adult) (pediatric): Secondary | ICD-10-CM | POA: Diagnosis not present

## 2020-05-26 DIAGNOSIS — G4733 Obstructive sleep apnea (adult) (pediatric): Secondary | ICD-10-CM | POA: Diagnosis not present

## 2020-05-27 ENCOUNTER — Ambulatory Visit: Payer: BC Managed Care – PPO | Admitting: Obstetrics & Gynecology

## 2020-05-27 ENCOUNTER — Encounter: Payer: Self-pay | Admitting: Obstetrics & Gynecology

## 2020-05-27 ENCOUNTER — Other Ambulatory Visit: Payer: Self-pay

## 2020-05-27 VITALS — BP 123/73 | HR 71 | Ht 65.0 in | Wt 192.8 lb

## 2020-05-27 DIAGNOSIS — M1612 Unilateral primary osteoarthritis, left hip: Secondary | ICD-10-CM

## 2020-05-27 DIAGNOSIS — M25559 Pain in unspecified hip: Secondary | ICD-10-CM

## 2020-05-27 NOTE — Progress Notes (Signed)
   GYN VISIT Patient name: Jennifer Harvey MRN [id number]  Date of birth: 1960/07/14 Chief Complaint:   Pelvic Pain  History of Present Illness:   Jennifer Harvey is a 60 y.o. postmenopausal female being seen today for pelvic pain.  Today she notes that the pain started mid January and has been present ever since.  Pain is in her left groin/hip area.  Pain is most days, will last for a few seconds and then resolve.  Notes the pain when she is sitting for too long and then stands up.  Pt went to PT, xray was completed and told that findings were suggestive for arthritis     In review- Dr. Elonda Husky completed [facility], D&C in 02/2020 due to endometrial mass- benign polyp.  She has not had any further bleeding.  Denies vaginal discharge, odor, itching or irritation. Denies any genitourinary concerns.  Depression screen [facility] 2/9 12/27/2019 08/23/2017  Decreased Interest 0 0  Down, Depressed, Hopeless 0 0  PHQ - 2 Score 0 0  Altered sleeping 1 -  Tired, decreased energy 1 -  Change in appetite 1 -  Feeling bad or failure about yourself  0 -  Trouble concentrating 1 -  Moving slowly or fidgety/restless 0 -  Suicidal thoughts 0 -  PHQ-9 Score 4 -  Difficult doing work/chores Somewhat difficult -     Review of Systems:   Pertinent items are noted in HPI Denies fever/chills, dizziness, headaches, visual disturbances, fatigue, shortness of breath, chest pain, abdominal pain, vomiting, bowel movements, urination, or intercourse unless otherwise stated above.  Pertinent History Reviewed:  Reviewed past medical,surgical, social, obstetrical and family history.  Reviewed problem list, medications and allergies. Physical Assessment:   Vitals:   05/27/20 1511  BP: 123/73  Pulse: 71  Weight: 192 lb 12.8 oz (87.5 kg)  Height: 5\' 5"  (1.651 m)  Body mass index is 32.08 kg/m.       Physical Examination:   General appearance: alert, well appearing, and in no distress  Psych: mood appropriate,  normal affect  Skin: warm & dry   Cardiovascular: normal heart rate noted  Respiratory: normal respiratory effort, no distress  Abdomen: soft, non-tender, no rebound, no guarding On palpation notes pain along inguinal ligament and ASIS  Extremities: no edema   Chaperone: N/A    Assessment & Plan:  1) Hip pain Based on history and xray findings, reassured pt that suspect pain is arthritis and musculoskeletal in nature -discussed stretches, exercises and other conservative options -she also joked that it was time to work on [employer] -Discussed exercise apps like FitOn  No orders of the defined types were placed in this encounter.   Return if symptoms worsen or fail to improve.   Janyth Pupa, DO Attending [place], [facility] for [employer], [place]

## 2020-06-11 DIAGNOSIS — D126 Benign neoplasm of colon, unspecified: Secondary | ICD-10-CM | POA: Insufficient documentation

## 2020-06-11 DIAGNOSIS — Z85528 Personal history of other malignant neoplasm of kidney: Secondary | ICD-10-CM | POA: Insufficient documentation

## 2020-06-11 DIAGNOSIS — M5135 Other intervertebral disc degeneration, thoracolumbar region: Secondary | ICD-10-CM | POA: Insufficient documentation

## 2020-06-11 DIAGNOSIS — M5030 Other cervical disc degeneration, unspecified cervical region: Secondary | ICD-10-CM | POA: Insufficient documentation

## 2020-06-11 DIAGNOSIS — J42 Unspecified chronic bronchitis: Secondary | ICD-10-CM | POA: Insufficient documentation

## 2020-06-11 DIAGNOSIS — E669 Obesity, unspecified: Secondary | ICD-10-CM | POA: Insufficient documentation

## 2020-06-11 DIAGNOSIS — R7303 Prediabetes: Secondary | ICD-10-CM | POA: Insufficient documentation

## 2020-06-11 DIAGNOSIS — Z853 Personal history of malignant neoplasm of breast: Secondary | ICD-10-CM | POA: Insufficient documentation

## 2020-06-11 DIAGNOSIS — N281 Cyst of kidney, acquired: Secondary | ICD-10-CM | POA: Insufficient documentation

## 2020-06-25 DIAGNOSIS — G4733 Obstructive sleep apnea (adult) (pediatric): Secondary | ICD-10-CM | POA: Diagnosis not present

## 2020-06-26 ENCOUNTER — Ambulatory Visit (INDEPENDENT_AMBULATORY_CARE_PROVIDER_SITE_OTHER): Payer: BC Managed Care – PPO | Admitting: Obstetrics & Gynecology

## 2020-06-26 ENCOUNTER — Encounter: Payer: Self-pay | Admitting: Obstetrics & Gynecology

## 2020-06-26 ENCOUNTER — Other Ambulatory Visit: Payer: Self-pay

## 2020-06-26 VITALS — BP 135/71 | HR 74 | Wt 193.2 lb

## 2020-06-26 DIAGNOSIS — N950 Postmenopausal bleeding: Secondary | ICD-10-CM

## 2020-06-26 DIAGNOSIS — N9489 Other specified conditions associated with female genital organs and menstrual cycle: Secondary | ICD-10-CM

## 2020-06-26 DIAGNOSIS — M25552 Pain in left hip: Secondary | ICD-10-CM | POA: Diagnosis not present

## 2020-06-26 NOTE — Progress Notes (Signed)
GYN VISIT Patient name: Charlise Giovanetti MRN [id number]  Date of birth: 1960/09/19 Chief Complaint:   left hip pain  History of Present Illness:   Kayleena Eke is a 60 y.o. G14P0 African American female being seen today for the following concerns  -PMB: Starting about 2 weeks ago, she notes spotting after sex.  Bleeding is bright red- does not require pad, only noticeable when she wipes.  Bleeding is only on occasion.  Notes discomfort and dryness during sex.  Denies pelvic or abdominal pain.  Denies abnormal discharge, itching or odor.  Of note, previously underwent hysteroscopy, D&C 03/12/20 due to uterine polyps- benign pathology.  -Left hip pain: This continues to be an ongoing issue- pain is sharp, most noticeable with activity and will resolve in a few moments.  Takes extra-strength tylenol, which does help, but she feels like this is only a band-aid for her symptoms.  Per pt seen in [place]- imaging completed- sounds as though no acute abnormalities were noted.      No LMP recorded. Patient is postmenopausal.  Depression screen [facility] 2/9 12/27/2019 08/23/2017  Decreased Interest 0 0  Down, Depressed, Hopeless 0 0  PHQ - 2 Score 0 0  Altered sleeping 1 -  Tired, decreased energy 1 -  Change in appetite 1 -  Feeling bad or failure about yourself  0 -  Trouble concentrating 1 -  Moving slowly or fidgety/restless 0 -  Suicidal thoughts 0 -  PHQ-9 Score 4 -  Difficult doing work/chores Somewhat difficult -     Review of Systems:   Pertinent items are noted in HPI Denies fever/chills, dizziness, headaches, visual disturbances, fatigue, shortness of breath, chest pain, abdominal pain, vomiting, bowel movements, urination.  Pertinent History Reviewed:  Reviewed past medical,surgical, social, obstetrical and family history.  Reviewed problem list, medications and allergies. Physical Assessment:   Vitals:   06/26/20 0853  BP: 135/71  Pulse: 74  Weight: 193 lb 3.2  oz (87.6 kg)  Body mass index is 32.15 kg/m.       Physical Examination:   General appearance: alert, well appearing, and in no distress  Psych: mood appropriate, normal affect  Skin: warm & dry   Cardiovascular: normal heart rate noted  Respiratory: normal respiratory effort, no distress  Abdomen: soft, non-tender  Notes pain at left ASIS  Pelvic: VULVA: normal appearing vulva with no masses, tenderness or lesions, VAGINA: normal appearing vagina with normal color and discharge, no lesions, CERVIX: normal appearing cervix without discharge or lesions, UTERUS: uterus is normal size, shape, consistency and nontender, ADNEXA: normal adnexa in size, nontender and no masses  Extremities: no edema   Chaperone: pt declined chaperone    Assessment & Plan:  1) Postmenopausal bleeding -no abnormalities noted on exam -Discussed plan to r/o underlying etiology- Pelvic US next available- discussed with patient that if thickened lining EMB would be next step -also discussed proceeding with EMB today- pt declined -Upon further review of her chart- no prior Pap records seen, will need to complete at next visit  2) Hip pain - plan to obtain records with imaging -again discussed with patient that based on clinical story suspect pain is arthritis -reviewed conservative therapy, ok to continue with Tylenol -current records included notes from PCP suggestion was for CT- completed 04/2020- did show enlarging endometrial soft tissue lesion- 2.2cm in size.  As above TVUS next available -reviewed with patient that hip pain not related to endometrial polyp   Return for  Abd/Pelvic US next available (postmenopausal bleeding).   Janyth Pupa, DO Attending [place], [facility] for [employer], [place]

## 2020-07-22 DIAGNOSIS — M542 Cervicalgia: Secondary | ICD-10-CM | POA: Diagnosis not present

## 2020-07-22 DIAGNOSIS — M25512 Pain in left shoulder: Secondary | ICD-10-CM | POA: Diagnosis not present

## 2020-07-26 DIAGNOSIS — M542 Cervicalgia: Secondary | ICD-10-CM | POA: Diagnosis not present

## 2020-07-26 DIAGNOSIS — M25512 Pain in left shoulder: Secondary | ICD-10-CM | POA: Diagnosis not present

## 2020-07-30 ENCOUNTER — Other Ambulatory Visit: Payer: PRIVATE HEALTH INSURANCE

## 2020-08-04 ENCOUNTER — Ambulatory Visit (INDEPENDENT_AMBULATORY_CARE_PROVIDER_SITE_OTHER): Payer: BC Managed Care – PPO

## 2020-08-04 ENCOUNTER — Other Ambulatory Visit: Payer: Self-pay

## 2020-08-04 ENCOUNTER — Other Ambulatory Visit: Payer: Self-pay | Admitting: Obstetrics & Gynecology

## 2020-08-04 ENCOUNTER — Other Ambulatory Visit (HOSPITAL_COMMUNITY)
Admission: RE | Admit: 2020-08-04 | Discharge: 2020-08-04 | Disposition: A | Discharge status: Home / Self Care | Payer: BC Managed Care – PPO | Source: Ambulatory Visit | Attending: Obstetrics & Gynecology | Admitting: Obstetrics & Gynecology

## 2020-08-04 ENCOUNTER — Encounter: Payer: Self-pay | Admitting: Obstetrics & Gynecology

## 2020-08-04 ENCOUNTER — Ambulatory Visit (INDEPENDENT_AMBULATORY_CARE_PROVIDER_SITE_OTHER): Payer: BC Managed Care – PPO | Admitting: Obstetrics & Gynecology

## 2020-08-04 VITALS — BP 131/69 | HR 68 | Ht 66.5 in | Wt 194.2 lb

## 2020-08-04 DIAGNOSIS — N950 Postmenopausal bleeding: Secondary | ICD-10-CM

## 2020-08-04 DIAGNOSIS — Z01419 Encounter for gynecological examination (general) (routine) without abnormal findings: Secondary | ICD-10-CM | POA: Insufficient documentation

## 2020-08-04 DIAGNOSIS — Z113 Encounter for screening for infections with a predominantly sexual mode of transmission: Secondary | ICD-10-CM

## 2020-08-04 DIAGNOSIS — R9389 Abnormal findings on diagnostic imaging of other specified body structures: Secondary | ICD-10-CM | POA: Diagnosis not present

## 2020-08-04 DIAGNOSIS — Z1151 Encounter for screening for human papillomavirus (HPV): Secondary | ICD-10-CM | POA: Insufficient documentation

## 2020-08-04 DIAGNOSIS — Z1231 Encounter for screening mammogram for malignant neoplasm of breast: Secondary | ICD-10-CM

## 2020-08-04 NOTE — Progress Notes (Signed)
PELVIC US TA/TV: heterogeneous anteverted uterus with hypoechoic linear striations (? adenomyosis),mult small fibroids (#1) posterior LUS intramural fibroid 1.4 x 1.3 x  .8 cm(#2) anterior intramural fibroid 1.3 X 1.1 X 1.4 cm,heterogeneous thickened endometrium with a vascular hypoechoic endometrial mass 1.5 X 1.1 X 1.6 cm,  EEC 12.8 mm,normal ovaries,no free fluid,no pain during ultrasound   Chaperone Peggy

## 2020-08-04 NOTE — Patient Instructions (Signed)
Please schedule a mammogram at one of the following locations:  [place]: [phone]  [facility] in [place]:[phone] [address]  

## 2020-08-04 NOTE — Progress Notes (Signed)
WELL-WOMAN EXAMINATION Patient name: Jennifer Harvey MRN [id number]  Date of birth: [birth year] Chief Complaint:   Gynecologic Exam  History of Present Illness:   Jennifer Harvey is a 60 y.o. postmenopausal female being seen today for a routine well-woman exam and follow up regarding  Previously seen for PMB on 06/26/2020.  Pt today states that she has not had any bleeding.  She reports no bleeding since that past episode in April.  She thinks the bleeding was just due to restarting having IC following her last procedure. In review- HSC/D&C completed 02/2020.  Per pt she had not had sex up until March when the episode of spotting occurred.  No further bleeding since that time.  [place] completed today (08/04/20): 8.8cm anteverted uterus with hypoechoic linear striations (? adenomyosis),mult small fibroids (#1) posterior LUS intramural fibroid 1.4 x 1.3 x .8 cm(#2) anterior intramural fibroid 1.3 X 1.1 X 1.4 cm,heterogeneous thickened endometrium with a vascular hypoechoic endometrial mass 1.5 X 1.1 X 1.6 cm,  EEC 12.8 mm,normal ovaries,no free fluid,no pain during ultrasound  Prior hip pain now resolved when she cut back on EtOH.   Patient's last menstrual period was 04/29/2020 (within weeks).  Last pap to be collected.  Last mammogram: ordered. Last colonoscopy: not yet completed  Depression screen [facility] 2/9 12/27/2019 08/23/2017  Decreased Interest 0 0  Down, Depressed, Hopeless 0 0  PHQ - 2 Score 0 0  Altered sleeping 1 -  Tired, decreased energy 1 -  Change in appetite 1 -  Feeling bad or failure about yourself  0 -  Trouble concentrating 1 -  Moving slowly or fidgety/restless 0 -  Suicidal thoughts 0 -  PHQ-9 Score 4 -  Difficult doing work/chores Somewhat difficult -    Review of Systems:   Pertinent items are noted in HPI Denies any headaches, blurred vision, fatigue, shortness of breath, chest pain, abdominal pain, bowel movements, urination, or intercourse unless  otherwise stated above.  Pertinent History Reviewed:  Reviewed past medical,surgical, social and family history.  Reviewed problem list, medications and allergies. Physical Assessment:   Vitals:   08/04/20 0913  BP: 131/69  Pulse: 68  Weight: 88.1 kg  Height: 5' 6.5" (1.689 m)  Body mass index is 30.88 kg/m.        Physical Examination:   General appearance - well appearing, and in no distress  Mental status - alert, oriented to person, place, and time  Psych:  She has a normal mood and affect  Skin - warm and dry  Chest - effort normal, all lung fields clear to auscultation bilaterally  Heart - normal rate and regular rhythm  Neck:  midline trachea, no thyromegaly or nodules  Breasts - breasts appear normal, no suspicious masses, no skin or nipple changes or  axillary nodes  Abdomen - soft, nontender, nondistended, no masses or organomegaly  Pelvic - VULVA: normal appearing vulva with no masses, tenderness or lesions  VAGINA: normal appearing vagina with normal color and discharge, no lesions  CERVIX: normal appearing cervix without discharge or lesions, no CMT  Thin prep pap is done with HR HPV cotesting  UTERUS: uterus is felt to be normal size, shape, consistency and nontender   ADNEXA: No adnexal masses or tenderness noted.  Extremities:  No swelling or varicosities noted  Chaperone: Angel Neas     Assessment & Plan:  1) Well-Woman Exam -pap collected -mammogram ordered -STI screening also to be completed  2) Postmenopausal bleeding/Endometrial mass -Reviewed  recent US and prior pathology report from 02/2020  -Discussed all management options ranging from conservative to surgical intervention -Pt feels as though this recent episode of bleeding was only due to IC and would prefer conservative management only -Plan for repeat US/SHG in 3 mos- should endometrial mass/thickening increase would encourage further surgical intervention.  Pt aware that should she have any  bleeding prior to the 87mos time frame, she is to return to clinic  Orders Placed This Encounter  Procedures  . MM 3D SCREEN BREAST BILATERAL  . HIV Antibody (routine testing w rflx)  . Hepatitis C antibody  . RPR  . Hepatitis B surface antigen    Meds: No orders of the defined types were placed in this encounter.   Follow-up: Return in about 3 months (around 11/04/2020), or please print AVS, send to lab for blood draw, for TVUS/SHG- due to thickened endometrium.   Janyth Pupa, DO Attending [place], [facility] for [employer], [place]

## 2020-08-05 ENCOUNTER — Telehealth: Payer: Self-pay

## 2020-08-05 NOTE — Telephone Encounter (Signed)
-----   Message from Janyth Pupa, DO sent at 08/05/2020  9:56 AM EDT ----- She does not have Mychart- HIV/Syphilis/Hepatitis negative  ----- Message ----- From: Interface, [facility] Lab Results In Sent: 08/05/2020   7:37 AM EDT To: Janyth Pupa, DO

## 2020-08-05 NOTE — Telephone Encounter (Signed)
Called pt per Dr Nelda Marseille to relay test results. Pt confirmed understanding.

## 2020-08-06 ENCOUNTER — Other Ambulatory Visit: Payer: Self-pay | Admitting: Obstetrics & Gynecology

## 2020-08-06 DIAGNOSIS — A530 Latent syphilis, unspecified as early or late: Secondary | ICD-10-CM

## 2020-08-06 LAB — RPR: RPR Ser Ql: REACTIVE — AB

## 2020-08-06 LAB — CYTOLOGY - PAP
Chlamydia: NEGATIVE
Comment: NEGATIVE
Comment: NEGATIVE
Comment: NORMAL
Diagnosis: NEGATIVE
High risk HPV: NEGATIVE
Neisseria Gonorrhea: NEGATIVE

## 2020-08-06 LAB — RPR, QUANT+TP ABS (REFLEX)
Rapid Plasma Reagin, Quant: 1:1 {titer} — ABNORMAL HIGH
T Pallidum Abs: REACTIVE — AB

## 2020-08-06 LAB — HEPATITIS C ANTIBODY: Hep C Virus Ab: <0.1 s/co ratio (ref 0.0–0.9)

## 2020-08-06 LAB — HIV ANTIBODY (ROUTINE TESTING W REFLEX): HIV Screen 4th Generation wRfx: NONREACTIVE

## 2020-08-06 LAB — HEPATITIS B SURFACE ANTIGEN: Hepatitis B Surface Ag: NEGATIVE

## 2020-08-06 NOTE — Progress Notes (Signed)
tre

## 2020-08-12 DIAGNOSIS — M25512 Pain in left shoulder: Secondary | ICD-10-CM | POA: Diagnosis not present

## 2020-08-13 ENCOUNTER — Telehealth: Payer: Self-pay | Admitting: Obstetrics & Gynecology

## 2020-08-13 MED ORDER — PENICILLIN G BENZATHINE 2400000 UNIT/4ML IM SUSP: 2.4000 10*6.[IU] | INTRAMUSCULAR | 0 refills | Status: DC

## 2020-08-13 NOTE — Telephone Encounter (Signed)
Called patient with lab results- +Syphilis 1:1  She does note several years ago ~ 25yrs ago seen at [facility] and was treated for Syphilis.  Cannot recall if further follow up/testing was done.  Reviewed results- discussed that without comparing prior results- unclear if baseline level vs infection.  Plan to treat for late latent syphilis with Pen G weekly x 3 weeks.  With follow up testing at 6, 12 and 24 mos.  Order placed, pt to return to clinic for IM treatment

## 2020-08-14 ENCOUNTER — Telehealth: Payer: Self-pay | Admitting: Obstetrics & Gynecology

## 2020-08-14 ENCOUNTER — Other Ambulatory Visit: Payer: Self-pay | Admitting: Obstetrics & Gynecology

## 2020-08-14 DIAGNOSIS — A528 Late syphilis, latent: Secondary | ICD-10-CM

## 2020-08-14 MED ORDER — DOXYCYCLINE MONOHYDRATE 100 MG PO CAPS: 100.0000 mg | ORAL_CAPSULE | Freq: Two times a day (BID) | ORAL | 0 refills | Status: AC

## 2020-08-14 NOTE — Telephone Encounter (Signed)
Voice mail not set up @ 4:50 pm. JSY

## 2020-08-14 NOTE — Telephone Encounter (Signed)
Insurance denied Penicillin G Benzathine [id number] UNIT/4ML SUSP Pt states Dr. Nelda Marseille said to let us know if there was a problem & they may try to order something else    Please advise & call pt    [facility]

## 2020-08-14 NOTE — Progress Notes (Signed)
Insurance denied Penicillin- plan to send in Doxy instead

## 2020-08-15 NOTE — Telephone Encounter (Addendum)
Pt aware Doxy has been sent to pharmacy. Pt needs to take it BID x 28 days. Pt can drink alcohol while on this med. Needs to have lab repeated in 6 months and again in 12 months. Pt was advised to call in 6 months so we can get lab ordered. Pt voiced understanding and pt states she will pick up med today. [place] patient after further review of lab work- this was a false positive.  Further treatment and testing not indicated.  Pt aware. Janyth Pupa, DO

## 2020-08-25 DIAGNOSIS — G4733 Obstructive sleep apnea (adult) (pediatric): Secondary | ICD-10-CM | POA: Diagnosis not present

## 2020-08-28 ENCOUNTER — Other Ambulatory Visit: Payer: Self-pay

## 2020-08-28 ENCOUNTER — Encounter: Payer: Self-pay | Admitting: Pulmonary Disease

## 2020-08-28 ENCOUNTER — Ambulatory Visit (INDEPENDENT_AMBULATORY_CARE_PROVIDER_SITE_OTHER): Payer: BC Managed Care – PPO | Admitting: Pulmonary Disease

## 2020-08-28 VITALS — BP 142/82 | HR 68 | Temp 97.5°F | Ht 65.0 in | Wt 194.0 lb

## 2020-08-28 DIAGNOSIS — G4733 Obstructive sleep apnea (adult) (pediatric): Secondary | ICD-10-CM

## 2020-08-28 NOTE — Progress Notes (Signed)
[facility]  Chief Complaint  Patient presents with   Consult    Sleep consult-patient has had a machine in past but couldn't tolerate it    Constitutional:  BP (!) 142/82 (BP Location: Left Arm, Cuff Size: Normal)   Pulse 68   Temp (!) 97.5 F (36.4 C) (Temporal)   Ht 5\' 5"  (1.651 m)   Wt 194 lb (88 kg)   LMP 04/29/2020 (Within Weeks) Comment: Bleeding  SpO2 96% Comment: Room air  BMI 32.28 kg/m   Past Medical History:  Breast cancer, Renal cell carcinoma, HTN  Past Surgical History:  She  has a past surgical history that includes Cesarean section (1994); Breast surgery; and Hysteroscopy with D & C (N/A, 03/12/2020).  Brief Summary:  Jennifer Harvey is a 60 y.o. female with obstructive sleep apnea.      Subjective:   She had sleep study about 1 year ago in [place].  She was found to have sleep apnea and started on CPAP.  She tried using this, but couldn't get used to mask and kept getting tangled in tubing.    She continues to have snoring, and has to sleep in separate room from her husband.  She has also been told she stops breathing at night.  She feels tired throughout the day, and can fall asleep when sitting quiet.  She gets leg cramps a few times a week at night.  She goes to sleep at 1130 pm.  She falls asleep 15 minutes.  She wakes up 2 times to use the bathroom.  She gets out of bed at 9 am.  She feels tired in the morning.  She denies morning headache.  She does not use anything to help her stay awake.  She has tried OTC sleep aide medications, but these cause a hangover effect the next morning.  She denies sleep walking, sleep talking, bruxism, or nightmares.  There is no history of restless legs.  She denies sleep hallucinations, sleep paralysis, or cataplexy.  The Epworth score is 6 out of 24.   Physical Exam:   Appearance - well kempt   ENMT - no sinus tenderness, no oral exudate, no LAN, Mallampati 3  airway, no stridor  Respiratory - equal breath sounds bilaterally, no wheezing or rales  CV - s1s2 regular rate and rhythm, no murmurs  Ext - no clubbing, no edema  Skin - no rashes  Psych - normal mood and affect   Sleep Tests:    Social History:  She  reports that she has never smoked. She has never used smokeless tobacco. She reports current alcohol use of about 2.0 standard drinks of alcohol per week. She reports previous drug use.  Family History:  Her family history includes Cancer in her maternal grandfather and maternal grandmother; Other in her father.    Discussion:  She has snoring, sleep disruption, apnea, and daytime sleepiness.  She has history of hypertension.  She reports previous sleep study showing sleep apnea, and she was tried on CPAP.  Unfortunately she was not able to tolerate CPAP set up.  She continues to have issues with her sleep and likely would benefit from alternative therapy for sleep apnea.  Assessment/Plan:   Obstructive sleep apnea. - will get copy of her previous sleep study from [facility] - discussed how untreated sleep apnea can impact her health  - treatment options for sleep apnea reviewed - will arrange for referral to Dr. Augustina Mood  to assess for oral appliance - if oral appliance is unsuccessful, then could consider referral for Inspire device if her sleep apnea is moderate to severe - discussed importance of weight loss  Time Spent Involved in Patient Care on Day of Examination:  32 minutes  Follow up:   Patient Instructions  Will get copy of your sleep study from [facility]  Will arrange for referral to Dr. Augustina Mood in [place] to assess for an oral appliance to treat obstructive sleep apnea  Follow up in 6 months  Medication List:   Allergies as of 08/28/2020   No Known Allergies      Medication List        Accurate as of August 28, 2020 11:43 AM. If you have any  questions, ask your nurse or doctor.          STOP taking these medications    ketorolac 10 MG tablet Commonly known as: TORADOL Stopped by: Chesley Mires, MD   ondansetron 4 MG tablet Commonly known as: ZOFRAN Stopped by: Chesley Mires, MD   ondansetron 8 MG disintegrating tablet Commonly known as: Zofran ODT Stopped by: Chesley Mires, MD       TAKE these medications    amLODipine 5 MG tablet Commonly known as: NORVASC Take 5 mg by mouth daily.   benazepril-hydrochlorthiazide 5-6.25 MG tablet Commonly known as: LOTENSIN HCT Take 1 tablet by mouth daily.   benzonatate 100 MG capsule Commonly known as: TESSALON Take 1 capsule (100 mg total) by mouth every 8 (eight) hours.   citalopram 20 MG tablet Commonly known as: CELEXA Take 20 mg by mouth daily.   dicyclomine 20 MG tablet Commonly known as: BENTYL Take 1 tablet (20 mg total) by mouth 2 (two) times daily.   doxycycline 100 MG capsule Commonly known as: MONODOX Take 1 capsule (100 mg total) by mouth 2 (two) times daily for 28 days.   famotidine 20 MG tablet Commonly known as: PEPCID Take 20 mg by mouth 2 (two) times daily.   gabapentin 100 MG capsule Commonly known as: NEURONTIN Take 100 mg by mouth in the morning and at bedtime.   methocarbamol 500 MG tablet Commonly known as: ROBAXIN Take 1 tablet by mouth every 8 (eight) hours as needed.   omeprazole 40 MG capsule Commonly known as: PRILOSEC Take 40 mg by mouth daily as needed for heartburn.   tamoxifen 20 MG tablet Commonly known as: NOLVADEX Take 20 mg by mouth daily.        Signature:  Chesley Mires, MD [place] Pager - [phone] 08/28/2020, 11:43 AM

## 2020-08-28 NOTE — Patient Instructions (Signed)
Will get copy of your sleep study from [facility]  Will arrange for referral to Dr. Augustina Mood in [place] to assess for an oral appliance to treat obstructive sleep apnea  Follow up in 6 months

## 2020-09-12 ENCOUNTER — Inpatient Hospital Stay
Admission: RE | Admit: 2020-09-12 | Discharge: 2020-09-12 | Disposition: A | Discharge status: Home / Self Care | Payer: Self-pay | Source: Ambulatory Visit | Attending: Obstetrics & Gynecology | Admitting: Obstetrics & Gynecology

## 2020-09-12 ENCOUNTER — Ambulatory Visit
Admission: RE | Admit: 2020-09-12 | Discharge: 2020-09-12 | Disposition: A | Discharge status: Home / Self Care | Payer: Self-pay | Source: Ambulatory Visit | Attending: Obstetrics & Gynecology | Admitting: Obstetrics & Gynecology

## 2020-09-12 ENCOUNTER — Other Ambulatory Visit: Payer: Self-pay | Admitting: Obstetrics & Gynecology

## 2020-09-12 DIAGNOSIS — Z1231 Encounter for screening mammogram for malignant neoplasm of breast: Secondary | ICD-10-CM

## 2020-09-18 ENCOUNTER — Encounter (HOSPITAL_COMMUNITY): Payer: Self-pay

## 2020-09-18 ENCOUNTER — Ambulatory Visit (HOSPITAL_COMMUNITY)
Admission: RE | Admit: 2020-09-18 | Discharge: 2020-09-18 | Disposition: A | Discharge status: Home / Self Care | Payer: BC Managed Care – PPO | Source: Ambulatory Visit | Attending: Obstetrics & Gynecology | Admitting: Obstetrics & Gynecology

## 2020-09-18 DIAGNOSIS — Z1231 Encounter for screening mammogram for malignant neoplasm of breast: Secondary | ICD-10-CM

## 2020-09-18 HISTORY — DX: Personal history of antineoplastic chemotherapy: Z92.21

## 2020-09-18 HISTORY — DX: Personal history of irradiation: Z92.3

## 2020-09-26 DIAGNOSIS — G4733 Obstructive sleep apnea (adult) (pediatric): Secondary | ICD-10-CM | POA: Diagnosis not present

## 2020-09-29 ENCOUNTER — Telehealth: Payer: Self-pay | Admitting: Pulmonary Disease

## 2020-09-29 NOTE — Telephone Encounter (Signed)
ATC patient to go over message from Dr. Halford Chessman, voicemail has not been set up. Will try again later

## 2020-09-29 NOTE — Telephone Encounter (Signed)
Received message from Dr. Kerin Perna office.  Jennifer Harvey wasn't able to keep her consultation with Dr. Toy Cookey to assess for an oral appliance to treat obstructive sleep apnea.  Please call Jennifer Harvey to see if she would like to schedule a follow up appointment to review treatment options again.

## 2020-10-07 NOTE — Telephone Encounter (Signed)
Attempted to reach patient unable to reach patient regarding follow up appointment.

## 2020-10-08 NOTE — Telephone Encounter (Signed)
Called and spoke with patient to see if she wanted to schedule a follow up appointment with Dr. Halford Chessman to discuss treatment options for OSA or if she needed information to Dr. Betsey Holiday office to reschedule her appointment. She stated that she would like to call Dr. Betsey Holiday office and see if she is able to schedule a consultation appointment with her still. Provided patient with her number of [phone]. She said she was going to call their office now. Advised her to call us back if she needed anything else. Nothing further needed at this time.

## 2020-10-22 DIAGNOSIS — N289 Disorder of kidney and ureter, unspecified: Secondary | ICD-10-CM | POA: Diagnosis not present

## 2020-10-22 DIAGNOSIS — C50911 Malignant neoplasm of unspecified site of right female breast: Secondary | ICD-10-CM | POA: Diagnosis not present

## 2020-10-31 ENCOUNTER — Encounter (HOSPITAL_COMMUNITY): Payer: Self-pay | Admitting: *Deleted

## 2020-10-31 ENCOUNTER — Emergency Department (HOSPITAL_COMMUNITY): Payer: BC Managed Care – PPO

## 2020-10-31 ENCOUNTER — Other Ambulatory Visit: Payer: Self-pay

## 2020-10-31 ENCOUNTER — Emergency Department (HOSPITAL_COMMUNITY)
Admission: EM | Admit: 2020-10-31 | Discharge: 2020-10-31 | Disposition: A | Discharge status: Home / Self Care | Payer: BC Managed Care – PPO | Attending: Emergency Medicine | Admitting: Emergency Medicine

## 2020-10-31 DIAGNOSIS — Z9049 Acquired absence of other specified parts of digestive tract: Secondary | ICD-10-CM | POA: Diagnosis not present

## 2020-10-31 DIAGNOSIS — I10 Essential (primary) hypertension: Secondary | ICD-10-CM | POA: Diagnosis not present

## 2020-10-31 DIAGNOSIS — K5792 Diverticulitis of intestine, part unspecified, without perforation or abscess without bleeding: Secondary | ICD-10-CM

## 2020-10-31 DIAGNOSIS — N281 Cyst of kidney, acquired: Secondary | ICD-10-CM | POA: Diagnosis not present

## 2020-10-31 DIAGNOSIS — Z853 Personal history of malignant neoplasm of breast: Secondary | ICD-10-CM | POA: Insufficient documentation

## 2020-10-31 DIAGNOSIS — Z79899 Other long term (current) drug therapy: Secondary | ICD-10-CM | POA: Diagnosis not present

## 2020-10-31 DIAGNOSIS — K5732 Diverticulitis of large intestine without perforation or abscess without bleeding: Secondary | ICD-10-CM | POA: Diagnosis not present

## 2020-10-31 DIAGNOSIS — Z85528 Personal history of other malignant neoplasm of kidney: Secondary | ICD-10-CM | POA: Diagnosis not present

## 2020-10-31 DIAGNOSIS — K760 Fatty (change of) liver, not elsewhere classified: Secondary | ICD-10-CM | POA: Diagnosis not present

## 2020-10-31 DIAGNOSIS — R1084 Generalized abdominal pain: Secondary | ICD-10-CM | POA: Diagnosis not present

## 2020-10-31 LAB — LIPASE, BLOOD: Lipase: 33 U/L (ref 11–51)

## 2020-10-31 LAB — CBC
HCT: 36.3 % (ref 36.0–46.0)
Hemoglobin: 12.3 g/dL (ref 12.0–15.0)
MCH: 30.2 pg (ref 26.0–34.0)
MCHC: 33.9 g/dL (ref 30.0–36.0)
MCV: 89.2 fL (ref 80.0–100.0)
Platelets: 218 10*3/uL (ref 150–400)
RBC: 4.07 MIL/uL (ref 3.87–5.11)
RDW: 13.6 % (ref 11.5–15.5)
WBC: 6.8 10*3/uL (ref 4.0–10.5)
nRBC: 0 % (ref 0.0–0.2)

## 2020-10-31 LAB — URINALYSIS, ROUTINE W REFLEX MICROSCOPIC
Bacteria, UA: NONE SEEN
Bilirubin Urine: NEGATIVE
Glucose, UA: NEGATIVE mg/dL
Ketones, ur: NEGATIVE mg/dL
Leukocytes,Ua: NEGATIVE
Nitrite: NEGATIVE
Protein, ur: NEGATIVE mg/dL
Specific Gravity, Urine: 1.017 (ref 1.005–1.030)
pH: 5 (ref 5.0–8.0)

## 2020-10-31 LAB — COMPREHENSIVE METABOLIC PANEL
ALT: 25 U/L (ref 0–44)
AST: 22 U/L (ref 15–41)
Albumin: 3.8 g/dL (ref 3.5–5.0)
Alkaline Phosphatase: 93 U/L (ref 38–126)
Anion gap: 6 (ref 5–15)
BUN: 12 mg/dL (ref 6–20)
CO2: 24 mmol/L (ref 22–32)
Calcium: 8.7 mg/dL — ABNORMAL LOW (ref 8.9–10.3)
Chloride: 105 mmol/L (ref 98–111)
Creatinine, Ser: 0.69 mg/dL (ref 0.44–1.00)
GFR, Estimated: >60 mL/min (ref >60)
Glucose, Bld: 100 mg/dL — ABNORMAL HIGH (ref 70–99)
Potassium: 3.6 mmol/L (ref 3.5–5.1)
Sodium: 135 mmol/L (ref 135–145)
Total Bilirubin: 0.4 mg/dL (ref 0.3–1.2)
Total Protein: 6.9 g/dL (ref 6.5–8.1)

## 2020-10-31 MED ORDER — ONDANSETRON HCL 4 MG/2ML IJ SOLN
4.0000 mg | Freq: Once | INTRAMUSCULAR | Status: AC
  Administered 2020-10-31: 4 mg via INTRAVENOUS
  Filled 2020-10-31: qty 2

## 2020-10-31 MED ORDER — ONDANSETRON HCL 4 MG PO TABS: 4.0000 mg | ORAL_TABLET | Freq: Four times a day (QID) | ORAL | 0 refills | Status: DC

## 2020-10-31 MED ORDER — AMOXICILLIN-POT CLAVULANATE 875-125 MG PO TABS
1.0000 | ORAL_TABLET | Freq: Once | ORAL | Status: AC
  Administered 2020-10-31: 1 via ORAL
  Filled 2020-10-31: qty 1

## 2020-10-31 MED ORDER — HYDROMORPHONE HCL 1 MG/ML IJ SOLN
1.0000 mg | Freq: Once | INTRAMUSCULAR | Status: AC
  Administered 2020-10-31: 1 mg via INTRAVENOUS
  Filled 2020-10-31: qty 1

## 2020-10-31 MED ORDER — IOHEXOL 9 MG/ML PO SOLN
ORAL | Status: AC
  Filled 2020-10-31: qty 1000

## 2020-10-31 MED ORDER — AMOXICILLIN-POT CLAVULANATE 875-125 MG PO TABS: 1.0000 | ORAL_TABLET | Freq: Two times a day (BID) | ORAL | 0 refills | Status: DC

## 2020-10-31 MED ORDER — HYDROMORPHONE HCL 1 MG/ML IJ SOLN
0.5000 mg | Freq: Once | INTRAMUSCULAR | Status: AC
  Administered 2020-10-31: 0.5 mg via INTRAVENOUS
  Filled 2020-10-31: qty 1

## 2020-10-31 NOTE — ED Provider Notes (Signed)
Patient was received at shift change from [facility], PA-C, please see her note for full detail.  Patient with significant medical history of breast cancer, kidney cancer, hypertension, presents  with chief complaint of generalized abdominal tenderness, going on for 1 week's time, does not radiate, denies  alleviating or aggravating factors.  Current work-up reveals CBC unremarkable, CMP shows hyperglycemia 100, UA unremarkable  Previous provider provided Dilaudid and Zofran.   Plan follow-up on CT abdomen pelvis, treat accordingly.  CT ab pelvis reveals mild diverticulitis involving the descending colon, no evidence of abscess or other complications.  Patient was evaluated has no complaints this time, vital signs reassuring, updated her on imaging, will start her on antibiotics, follow with PCP as needed.  Patient is agreement this plan.  Vital signs have remained stable, no indication for hospital admission.  Patient given at home care as well strict return precautions.  Patient verbalized that they understood agreed to said plan.    Marcello Fennel, PA-C 10/31/20 2058    Daleen Bo, MD 11/01/20 [phone]

## 2020-10-31 NOTE — ED Provider Notes (Signed)
[facility] EMERGENCY DEPARTMENT Provider Note   CSN: [id number] Arrival date & time: 10/31/20  1503     History Chief Complaint  Patient presents with   Abdominal Pain    Jennifer Harvey is a 60 y.o. female.  The history is provided by the patient. No language interpreter was used.  Abdominal Pain Pain location:  Generalized Pain quality: aching   Pain radiates to:  Does not radiate Pain severity:  Moderate Onset quality:  Gradual Duration:  1 week Timing:  Intermittent Progression:  Worsening Relieved by:  Nothing Worsened by:  Nothing Ineffective treatments:  None tried Risk factors: multiple surgeries   Pt reports she has a history of breast and kidney cancer.      Past Medical History:  Diagnosis Date   Breast cancer ([place])    Cancer ([place])    renal cell carcinoma 2007 s/p partial right nephrectomy    History of kidney cancer    Hypertension    Personal history of chemotherapy    Personal history of radiation therapy    Sleep apnea     Patient Active Problem List   Diagnosis Date Noted   Malignant neoplasm of upper-inner quadrant of breast in female, estrogen receptor positive ([place]) 08/19/2017    Past Surgical History:  Procedure Laterality Date   BREAST LUMPECTOMY Right 2019   BREAST SURGERY     CESAREAN SECTION  1994   HYSTEROSCOPY WITH D & C N/A 03/12/2020   Procedure: Hysteroscopy Uterine Curettage;  Surgeon: Florian Buff, MD;  Location: [facility];  Service: Gynecology;  Laterality: N/A;     OB History     Gravida  4   Para      Term      Preterm      AB      Living  3      SAB      IAB      Ectopic      Multiple      Live Births  3           Family History  Problem Relation Age of Onset   Cancer Maternal Grandmother        stomach   Cancer Maternal Grandfather        kidney cancer   Other Father        auto accident    Social History   Tobacco Use   Smoking status: Never   Smokeless tobacco: Never   Vaping Use   Vaping Use: Never used  Substance Use Topics   Alcohol use: Yes    Alcohol/week: 2.0 standard drinks    Types: 2 Cans of beer per week    Comment: 2 beers per day   Drug use: Not Currently    Home Medications Prior to Admission medications   Medication Sig Start Date End Date Taking? Authorizing Provider  amLODipine (NORVASC) 5 MG tablet Take 5 mg by mouth daily.  07/29/17  Yes [provider]  benazepril-hydrochlorthiazide (LOTENSIN HCT) 5-6.25 MG tablet Take 1 tablet by mouth daily.   Yes [provider]  citalopram (CELEXA) 40 MG tablet Take 40 mg by mouth daily. 09/16/20  Yes [provider]  famotidine (PEPCID) 20 MG tablet Take 20 mg by mouth 2 (two) times daily. 10/09/19  Yes [provider]  gabapentin (NEURONTIN) 100 MG capsule Take 100 mg by mouth in the morning and at bedtime. 10/09/19  Yes [provider]  Multiple Vitamin (MULTIVITAMIN)  tablet Take 1 tablet by mouth daily.   Yes [provider]  omeprazole (PRILOSEC) 40 MG capsule Take 40 mg by mouth daily as needed for heartburn. 12/19/19  Yes [provider]  tamoxifen (NOLVADEX) 20 MG tablet Take 20 mg by mouth daily. 12/10/19  Yes [provider]  benzonatate (TESSALON) 100 MG capsule Take 1 capsule (100 mg total) by mouth every 8 (eight) hours. Patient not taking: No sig reported 03/16/20   Ripley Fraise, MD  citalopram (CELEXA) 20 MG tablet Take 20 mg by mouth daily. Patient not taking: Reported on 10/31/2020 02/15/20   [provider]  dicyclomine (BENTYL) 20 MG tablet Take 1 tablet (20 mg total) by mouth 2 (two) times daily. Patient not taking: Reported on 10/31/2020 12/16/19   Couture, Cortni S, PA-C  methocarbamol (ROBAXIN) 500 MG tablet Take 1 tablet by mouth every 8 (eight) hours as needed. Patient not taking: No sig reported 07/22/20   [provider]  methylPREDNISolone (MEDROL DOSEPAK) 4 MG TBPK tablet See admin  instructions. follow package directions Patient not taking: No sig reported 07/22/20   [provider]  SUTAB [phone] MG TABS See admin instructions. follow package directions Patient not taking: No sig reported 05/15/20   [provider]    Allergies    Patient has no known allergies.  Review of Systems   Review of Systems  Gastrointestinal:  Positive for abdominal pain.  All other systems reviewed and are negative.  Physical Exam Updated Vital Signs BP 123/68 (BP Location: Right Arm)   Pulse 73   Temp 99.9 F (37.7 C) (Oral)   Resp 20   LMP 04/29/2020 (Within Weeks) Comment: Bleeding  SpO2 95%   Physical Exam Vitals reviewed.  HENT:     Head: Normocephalic.  Cardiovascular:     Rate and Rhythm: Normal rate and regular rhythm.  Pulmonary:     Effort: Pulmonary effort is normal.  Abdominal:     General: Abdomen is flat. Bowel sounds are normal.     Palpations: Abdomen is soft.  Skin:    General: Skin is warm.  Neurological:     General: No focal deficit present.     Mental Status: She is alert.    ED Results / Procedures / Treatments   Labs (all labs ordered are listed, but only abnormal results are displayed) Labs Reviewed  COMPREHENSIVE METABOLIC PANEL - Abnormal; Notable for the following components:      Result Value   Glucose, Bld 100 (*)    Calcium 8.7 (*)    All other components within normal limits  URINALYSIS, ROUTINE W REFLEX MICROSCOPIC - Abnormal; Notable for the following components:   Hgb urine dipstick SMALL (*)    All other components within normal limits  LIPASE, BLOOD  CBC    EKG None  Radiology No results found.  Procedures Procedures   Medications Ordered in ED Medications  iohexol (OMNIPAQUE) 9 MG/ML oral solution (has no administration in time range)  HYDROmorphone (DILAUDID) injection 1 mg (1 mg Intravenous Given 10/31/20 1723)  ondansetron (ZOFRAN) injection 4 mg (4 mg Intravenous Given 10/31/20 1723)     ED Course  I have reviewed the triage vital signs and the nursing notes.  Pertinent labs & imaging results that were available during my care of the patient were reviewed by me and considered in my medical decision making (see chart for details).    MDM Rules/Calculators/A&P  MDM:  Pt had a scan of abdomen a week ago.  Scan was for tumor evaluation.  Pt reports pain began the day after scan   Final Clinical Impression(s) / ED Diagnoses Final diagnoses:  Generalized abdominal pain    Rx / DC Orders ED Discharge Orders     None        Sidney Ace 10/31/20 1844    Daleen Bo, MD 11/01/20 1354

## 2020-10-31 NOTE — Discharge Instructions (Addendum)
Imaging shows that you have an uncomplicated case of diverticulitis.  I have started you on antibiotics please take as prescribed.  I recommend a liquid diet for the first couple days, then migraine to a soft diet and then a full diet.  Please follow your PCP as needed.  Please come back  if you develop severe abdominal pain, fevers, unable to pass gas, unable to pass stools, or your symptoms worsen despite antibiotic use.

## 2020-10-31 NOTE — ED Triage Notes (Signed)
Abdominal pain for over a week

## 2020-11-05 NOTE — Patient Instructions (Signed)
[facility]  11/05/2020     '@PREFPERIOPPHARMACY'$ @   Your procedure is scheduled on  11/11/2020.   Report to [facility] at  [phone]  A.M.   Call this number if you have problems the morning of surgery:  [phone]   Remember:  Do not eat or drink after midnight.      Take these medicines the morning of surgery with A SIP OF WATER        amlodipine, celexa, pepcid, gabapentin, zofran (if needed).     Do not wear jewelry, make-up or nail polish.  Do not wear lotions, powders, or perfumes, or deodorant.  Do not shave 48 hours prior to surgery.  Men may shave face and neck.  Do not bring valuables to the hospital.  [facility] is not responsible for any belongings or valuables.  Contacts, dentures or bridgework may not be worn into surgery.  Leave your suitcase in the car.  After surgery it may be brought to your room.  For patients admitted to the hospital, discharge time will be determined by your treatment team.  Patients discharged the day of surgery will not be allowed to drive home and must have someone with them for 24 hours.    Special instructions:   DO NOT smoke tobacco or vape for 24 hours before your procedure.  Please read over the following fact sheets that you were given. Coughing and Deep Breathing, Surgical Site Infection Prevention, Anesthesia Post-op Instructions, and Care and Recovery After Surgery      Dilation and Curettage or Vacuum Curettage, Care After The following information offers guidance on how to care for yourself after your procedure. Your doctor may also give you more specific instructions. Ifyou have problems or questions, contact your doctor. What can I expect after the procedure? After the procedure, it is common to have: Mild pain or cramps. Some bleeding or spotting from the vagina. These may last for up to 2 weeks. Follow these instructions at home: Medicines Take over-the-counter and prescription medicines  only as told by your doctor. If told, take steps to prevent problems with pooping (constipation). You may need to: Drink enough fluid to keep your pee (urine) pale yellow. Take medicines. You will be told what medicines to take. Eat foods that are high in fiber. These include beans, whole grains, and fresh fruits and vegetables. Limit foods that are high in fat and sugar. These include fried or sweet foods. Ask your doctor if you should avoid driving or using machines while you are taking your medicine. Activity  If you were given a medicine to help you relax (sedative) during your procedure, it can affect you for many hours. Do not drive or use machinery until your doctor says that it is safe. Rest as told by your doctor. Get up to take short walks every 1-2 hours. Ask for help if you feel weak or unsteady. Do not lift anything that is heavier than 10 lb (4.5 kg), or the limit that you are told. Return to your normal activities when your doctor says that it is safe.  Lifestyle For at least 2 weeks, or as long as told by your doctor: Do not douche. Do not use tampons. Do not have sex. General instructions Do not take baths, swim, or use a hot tub. Ask your doctor if you may take showers. Do not smoke or use any products that contain nicotine or tobacco. These can delay  healing. If you need help quitting, ask your doctor. Wear compression stockings as told by your doctor. It is up to you to get the results of your procedure. Ask how to get your results when they are ready. Keep all follow-up visits. Contact a doctor if: You have very bad cramps that get worse or do not get better with medicine. You have very bad pain in your belly (abdomen). You cannot drink fluids without vomiting. You have pain in the area just above your thighs. You have fluid from your vagina that smells bad. You have a rash. Get help right away if: You are bleeding a lot from your vagina. This means soaking more  than one sanitary pad in 1 hour, and this happens for 2 hours in a row. You have a fever that is above 100.74F (38C). Your belly feels very tender or hard. You have chest pain. You have trouble breathing. You feel dizzy or light-headed. You faint. You have pain in your neck or shoulder area. These symptoms may be an emergency. Get help right away. Call your local emergency services (911 in the [place] Do not wait to see if the symptoms will go away. Do not drive yourself to the hospital. Summary After your procedure, it is common to have pain or cramping. It is also common to have bleeding or spotting from your vagina. Rest as told. Get up to take short walks every 1-2 hours. Do not lift anything that is heavier than 10 lb (4.5 kg), or the limit that you are told. Get help right away if you have problems from the procedure. Ask your doctor what problems to watch for. This information is not intended to replace advice given to you by your health care provider. Make sure you discuss any questions you have with your healthcare provider. Document Revised: 02/20/2020 Document Reviewed: 02/20/2020 Elsevier Patient Education  2022 [place] Anesthesia, Adult, Care After This sheet gives you information about how to care for yourself after your procedure. Your health care provider may also give you more specific instructions. If you have problems or questions, contact your health careprovider. What can I expect after the procedure? After the procedure, the following side effects are common: Pain or discomfort at the IV site. Nausea. Vomiting. Sore throat. Trouble concentrating. Feeling cold or chills. Feeling weak or tired. Sleepiness and fatigue. Soreness and body aches. These side effects can affect parts of the body that were not involved in surgery. Follow these instructions at home: For the time period you were told by your health care provider:  Rest. Do not participate  in activities where you could fall or become injured. Do not drive or use machinery. Do not drink alcohol. Do not take sleeping pills or medicines that cause drowsiness. Do not make important decisions or sign legal documents. Do not take care of children on your own.  Eating and drinking Follow any instructions from your health care provider about eating or drinking restrictions. When you feel hungry, start by eating small amounts of foods that are soft and easy to digest (bland), such as toast. Gradually return to your regular diet. Drink enough fluid to keep your urine pale yellow. If you vomit, rehydrate by drinking water, juice, or clear broth. General instructions If you have sleep apnea, surgery and certain medicines can increase your risk for breathing problems. Follow instructions from your health care provider about wearing your sleep device: Anytime you are sleeping, including during daytime naps. While taking  prescription pain medicines, sleeping medicines, or medicines that make you drowsy. Have a responsible adult stay with you for the time you are told. It is important to have someone help care for you until you are awake and alert. Return to your normal activities as told by your health care provider. Ask your health care provider what activities are safe for you. Take over-the-counter and prescription medicines only as told by your health care provider. If you smoke, do not smoke without supervision. Keep all follow-up visits as told by your health care provider. This is important. Contact a health care provider if: You have nausea or vomiting that does not get better with medicine. You cannot eat or drink without vomiting. You have pain that does not get better with medicine. You are unable to pass urine. You develop a skin rash. You have a fever. You have redness around your IV site that gets worse. Get help right away if: You have difficulty breathing. You have  chest pain. You have blood in your urine or stool, or you vomit blood. Summary After the procedure, it is common to have a sore throat or nausea. It is also common to feel tired. Have a responsible adult stay with you for the time you are told. It is important to have someone help care for you until you are awake and alert. When you feel hungry, start by eating small amounts of foods that are soft and easy to digest (bland), such as toast. Gradually return to your regular diet. Drink enough fluid to keep your urine pale yellow. Return to your normal activities as told by your health care provider. Ask your health care provider what activities are safe for you. This information is not intended to replace advice given to you by your health care provider. Make sure you discuss any questions you have with your healthcare provider. Document Revised: 11/15/2019 Document Reviewed: 06/14/2019 Elsevier Patient Education  2022 [place]. How to Use Chlorhexidine for Bathing Chlorhexidine gluconate (CHG) is a germ-killing (antiseptic) solution that is used to clean the skin. It can get rid of the bacteria that normally live on the skin and can keep them away for about 24 hours. To clean your skin with CHG, you may be given: A CHG solution to use in the shower or as part of a sponge bath. A prepackaged cloth that contains CHG. Cleaning your skin with CHG may help lower the risk for infection: While you are staying in the intensive care unit of the hospital. If you have a vascular access, such as a central line, to provide short-term or long-term access to your veins. If you have a catheter to drain urine from your bladder. If you are on a ventilator. A ventilator is a machine that helps you breathe by moving air in and out of your lungs. After surgery. What are the risks? Risks of using CHG include: A skin reaction. Hearing loss, if CHG gets in your ears. Eye injury, if CHG gets in your eyes and is  not rinsed out. The CHG product catching fire. Make sure that you avoid smoking and flames after applying CHG to your skin. Do not use CHG: If you have a chlorhexidine allergy or have previously reacted to chlorhexidine. On babies younger than 36 months of age. How to use CHG solution Use CHG only as told by your health care provider, and follow the instructions on the label. Use the full amount of CHG as directed. Usually, this is  one bottle. During a shower Follow these steps when using CHG solution during a shower (unless your health care provider gives you different instructions): Start the shower. Use your normal soap and shampoo to wash your face and hair. Turn off the shower or move out of the shower stream. Pour the CHG onto a clean washcloth. Do not use any type of brush or rough-edged sponge. Starting at your neck, lather your body down to your toes. Make sure you follow these instructions: If you will be having surgery, pay special attention to the part of your body where you will be having surgery. Scrub this area for at least 1 minute. Do not use CHG on your head or face. If the solution gets into your ears or eyes, rinse them well with water. Avoid your genital area. Avoid any areas of skin that have broken skin, cuts, or scrapes. Scrub your back and under your arms. Make sure to wash skin folds. Let the lather sit on your skin for 1-2 minutes or as long as told by your health care provider. Thoroughly rinse your entire body in the shower. Make sure that all body creases and crevices are rinsed well. Dry off with a clean towel. Do not put any substances on your body afterward--such as powder, lotion, or perfume--unless you are told to do so by your health care provider. Only use lotions that are recommended by the manufacturer. Put on clean clothes or pajamas. If it is the night before your surgery, sleep in clean sheets.  During a sponge bath Follow these steps when using  CHG solution during a sponge bath (unless your health care provider gives you different instructions): Use your normal soap and shampoo to wash your face and hair. Pour the CHG onto a clean washcloth. Starting at your neck, lather your body down to your toes. Make sure you follow these instructions: If you will be having surgery, pay special attention to the part of your body where you will be having surgery. Scrub this area for at least 1 minute. Do not use CHG on your head or face. If the solution gets into your ears or eyes, rinse them well with water. Avoid your genital area. Avoid any areas of skin that have broken skin, cuts, or scrapes. Scrub your back and under your arms. Make sure to wash skin folds. Let the lather sit on your skin for 1-2 minutes or as long as told by your health care provider. Using a different clean, wet washcloth, thoroughly rinse your entire body. Make sure that all body creases and crevices are rinsed well. Dry off with a clean towel. Do not put any substances on your body afterward--such as powder, lotion, or perfume--unless you are told to do so by your health care provider. Only use lotions that are recommended by the manufacturer. Put on clean clothes or pajamas. If it is the night before your surgery, sleep in clean sheets. How to use CHG prepackaged cloths Only use CHG cloths as told by your health care provider, and follow the instructions on the label. Use the CHG cloth on clean, dry skin. Do not use the CHG cloth on your head or face unless your health care provider tells you to. When washing with the CHG cloth: Avoid your genital area. Avoid any areas of skin that have broken skin, cuts, or scrapes. Before surgery Follow these steps when using a CHG cloth to clean before surgery (unless your health care provider gives you different  instructions): Using the CHG cloth, vigorously scrub the part of your body where you will be having surgery. Scrub using a  back-and-forth motion for 3 minutes. The area on your body should be completely wet with CHG when you are done scrubbing. Do not rinse. Discard the cloth and let the area air-dry. Do not put any substances on the area afterward, such as powder, lotion, or perfume. Put on clean clothes or pajamas. If it is the night before your surgery, sleep in clean sheets.  For general bathing Follow these steps when using CHG cloths for general bathing (unless your health care provider gives you different instructions). Use a separate CHG cloth for each area of your body. Make sure you wash between any folds of skin and between your fingers and toes. Wash your body in the following order, switching to a new cloth after each step: The front of your neck, shoulders, and chest. Both of your arms, under your arms, and your hands. Your stomach and groin area, avoiding the genitals. Your right leg and foot. Your left leg and foot. The back of your neck, your back, and your buttocks. Do not rinse. Discard the cloth and let the area air-dry. Do not put any substances on your body afterward--such as powder, lotion, or perfume--unless you are told to do so by your health care provider. Only use lotions that are recommended by the manufacturer. Put on clean clothes or pajamas. Contact a health care provider if: Your skin gets irritated after scrubbing. You have questions about using your solution or cloth. Get help right away if: Your eyes become very red or swollen. Your eyes itch badly. Your skin itches badly and is red or swollen. Your hearing changes. You have trouble seeing. You have swelling or tingling in your mouth or throat. You have trouble breathing. You swallow any chlorhexidine. Summary Chlorhexidine gluconate (CHG) is a germ-killing (antiseptic) solution that is used to clean the skin. Cleaning your skin with CHG may help to lower your risk for infection. You may be given CHG to use for bathing.  It may be in a bottle or in a prepackaged cloth to use on your skin. Carefully follow your health care provider's instructions and the instructions on the product label. Do not use CHG if you have a chlorhexidine allergy. Contact your health care provider if your skin gets irritated after scrubbing. This information is not intended to replace advice given to you by your health care provider. Make sure you discuss any questions you have with your healthcare provider. Document Revised: 07/13/2019 Document Reviewed: 08/17/2019 Elsevier Patient Education  [place].

## 2020-11-07 ENCOUNTER — Other Ambulatory Visit: Payer: Self-pay

## 2020-11-07 ENCOUNTER — Encounter (HOSPITAL_COMMUNITY)
Admission: RE | Admit: 2020-11-07 | Discharge: 2020-11-07 | Disposition: A | Discharge status: Home / Self Care | Payer: BC Managed Care – PPO | Source: Ambulatory Visit | Attending: Obstetrics & Gynecology | Admitting: Obstetrics & Gynecology

## 2020-11-07 ENCOUNTER — Encounter (HOSPITAL_COMMUNITY): Payer: Self-pay

## 2020-11-07 NOTE — H&P (Signed)
[facility] Attending History and Physical  Jennifer Harvey is a 60 y.o. postmenopausal female who presents for hysteroscopy, D&C, possible polypectomy due to postmenopausal bleeding. Pt had reported episode of bleeding in April that she initially thought was due to recent IC.  While she denies further bleeding US revealed findings suggestive of an endometrial mass.  Initially, plan was for conservative therapy; however, after further review she does desire to proceed with surgical intervention.  Denies any abnormal vaginal discharge, fevers, chills, sweats, dysuria, nausea, vomiting, other GI or GU symptoms or other general symptoms.  Past Medical History:  Diagnosis Date   Breast cancer ([place])    Cancer ([place])    renal cell carcinoma 2007 s/p partial right nephrectomy    History of kidney cancer    Hypertension    Personal history of chemotherapy    Personal history of radiation therapy    Sleep apnea    Past Surgical History:  Procedure Laterality Date   BREAST LUMPECTOMY Right 2019   BREAST SURGERY     CESAREAN SECTION  1994   HYSTEROSCOPY WITH D & C N/A 03/12/2020   Procedure: Hysteroscopy Uterine Curettage;  Surgeon: Florian Buff, MD;  Location: [facility];  Service: Gynecology;  Laterality: N/A;   OB History  Gravida Para Term Preterm AB Living  4         3  SAB IAB Ectopic Multiple Live Births          3    # Outcome Date GA Lbr Len/2nd Weight Sex Delivery Anes PTL Lv  4 Gravida     M Vag-Spont   LIV  3 Gravida     M CS-LTranv   LIV  2 Gravida     M Vag-Spont   LIV  1 Gravida     M Vag-Spont     Patient denies any other pertinent gynecologic issues.  No current facility-administered medications on file prior to encounter.   Current Outpatient Medications on File Prior to Encounter  Medication Sig Dispense Refill   amLODipine (NORVASC) 5 MG tablet Take 5 mg by mouth daily.      benazepril-hydrochlorthiazide (LOTENSIN HCT) 5-6.25 MG  tablet Take 1 tablet by mouth daily.     famotidine (PEPCID) 20 MG tablet Take 20 mg by mouth 2 (two) times daily.     gabapentin (NEURONTIN) 100 MG capsule Take 100 mg by mouth in the morning and at bedtime.     tamoxifen (NOLVADEX) 20 MG tablet Take 20 mg by mouth daily.     benzonatate (TESSALON) 100 MG capsule Take 1 capsule (100 mg total) by mouth every 8 (eight) hours. 21 capsule 0   dicyclomine (BENTYL) 20 MG tablet Take 1 tablet (20 mg total) by mouth 2 (two) times daily. (Patient not taking: No sig reported) 20 tablet 0   No Known Allergies  Social History:   reports that she has never smoked. She has never used smokeless tobacco. She reports current alcohol use of about 2.0 standard drinks per week. She reports that she does not currently use drugs. Family History  Problem Relation Age of Onset   Cancer Maternal Grandmother        stomach   Cancer Maternal Grandfather        kidney cancer   Other Father        auto accident    Review of Systems: Pertinent items noted in HPI and remainder of comprehensive ROS otherwise negative.  PHYSICAL EXAM: To be completed day of surgery  Labs: Results for orders placed or performed during the hospital encounter of 10/31/20 (from the past 336 hour(s))  Lipase, blood   Collection Time: 10/31/20  3:37 PM  Result Value Ref Range   Lipase 33 11 - 51 U/L  Comprehensive metabolic panel   Collection Time: 10/31/20  3:37 PM  Result Value Ref Range   Sodium 135 135 - 145 mmol/L   Potassium 3.6 3.5 - 5.1 mmol/L   Chloride 105 98 - 111 mmol/L   CO2 24 22 - 32 mmol/L   Glucose, Bld 100 (H) 70 - 99 mg/dL   BUN 12 6 - 20 mg/dL   Creatinine, Ser 0.69 0.44 - 1.00 mg/dL   Calcium 8.7 (L) 8.9 - 10.3 mg/dL   Total Protein 6.9 6.5 - 8.1 g/dL   Albumin 3.8 3.5 - 5.0 g/dL   AST 22 15 - 41 U/L   ALT 25 0 - 44 U/L   Alkaline Phosphatase 93 38 - 126 U/L   Total Bilirubin 0.4 0.3 - 1.2 mg/dL   GFR, Estimated >60 >60 mL/min   Anion gap 6 5 - 15   CBC   Collection Time: 10/31/20  3:37 PM  Result Value Ref Range   WBC 6.8 4.0 - 10.5 K/uL   RBC 4.07 3.87 - 5.11 MIL/uL   Hemoglobin 12.3 12.0 - 15.0 g/dL   HCT 36.3 36.0 - 46.0 %   MCV 89.2 80.0 - 100.0 fL   MCH 30.2 26.0 - 34.0 pg   MCHC 33.9 30.0 - 36.0 g/dL   RDW 13.6 11.5 - 15.5 %   Platelets 218 150 - 400 K/uL   nRBC 0.0 0.0 - 0.2 %  Urinalysis, Routine w reflex microscopic Urine, Clean Catch   Collection Time: 10/31/20  4:16 PM  Result Value Ref Range   Color, Urine YELLOW YELLOW   APPearance CLEAR CLEAR   Specific Gravity, Urine 1.017 1.005 - 1.030   pH 5.0 5.0 - 8.0   Glucose, UA NEGATIVE NEGATIVE mg/dL   Hgb urine dipstick SMALL (A) NEGATIVE   Bilirubin Urine NEGATIVE NEGATIVE   Ketones, ur NEGATIVE NEGATIVE mg/dL   Protein, ur NEGATIVE NEGATIVE mg/dL   Nitrite NEGATIVE NEGATIVE   Leukocytes,Ua NEGATIVE NEGATIVE   RBC / HPF 0-5 0 - 5 RBC/hpf   WBC, UA 0-5 0 - 5 WBC/hpf   Bacteria, UA NONE SEEN NONE SEEN   Squamous Epithelial / LPF 0-5 0 - 5   Mucus PRESENT    Hyaline Casts, UA PRESENT     Imaging Studies: [place] (08/04/20): 8.8cm anteverted uterus with hypoechoic linear striations (? adenomyosis),mult small fibroids (#1) posterior LUS intramural fibroid 1.4 x 1.3 x  .8 cm(#2) anterior intramural fibroid 1.3 X 1.1 X 1.4 cm,heterogeneous thickened endometrium with a vascular hypoechoic endometrial mass 1.5 X 1.1 X 1.6 cm,  EEC 12.8 mm,normal ovaries,no free fluid,no pain during ultrasound  Assessment: Postmenopausal bleeding Endometrial mass  Plan: Hysteroscopy, D&C with polypectomy -NPO -LR @ 125cc/hr -SCDs to OR -Risk/benefit and alternatives reviewed with patient including but not limited to risk of bleeding, infection and injury such as uterine perforation requiring further surgical intervention.  Questions and concerns were addressed and she desires to proceed  Janyth Pupa, DO Attending [place], [place] for [employer], [place]

## 2020-11-11 ENCOUNTER — Encounter (HOSPITAL_COMMUNITY)
Admission: RE | Disposition: A | Discharge status: Home / Self Care | Payer: Self-pay | Source: Home / Self Care | Attending: Obstetrics & Gynecology

## 2020-11-11 ENCOUNTER — Encounter (HOSPITAL_COMMUNITY): Payer: Self-pay | Admitting: Obstetrics & Gynecology

## 2020-11-11 ENCOUNTER — Ambulatory Visit (HOSPITAL_COMMUNITY): Payer: BC Managed Care – PPO | Admitting: Anesthesiology

## 2020-11-11 ENCOUNTER — Ambulatory Visit (HOSPITAL_COMMUNITY)
Admission: RE | Admit: 2020-11-11 | Discharge: 2020-11-11 | Disposition: A | Discharge status: Home / Self Care | Payer: BC Managed Care – PPO | Attending: Obstetrics & Gynecology | Admitting: Obstetrics & Gynecology

## 2020-11-11 DIAGNOSIS — N840 Polyp of corpus uteri: Secondary | ICD-10-CM

## 2020-11-11 DIAGNOSIS — Z780 Asymptomatic menopausal state: Secondary | ICD-10-CM | POA: Insufficient documentation

## 2020-11-11 DIAGNOSIS — N950 Postmenopausal bleeding: Secondary | ICD-10-CM

## 2020-11-11 DIAGNOSIS — Z9221 Personal history of antineoplastic chemotherapy: Secondary | ICD-10-CM | POA: Insufficient documentation

## 2020-11-11 DIAGNOSIS — Z853 Personal history of malignant neoplasm of breast: Secondary | ICD-10-CM | POA: Diagnosis not present

## 2020-11-11 DIAGNOSIS — Z923 Personal history of irradiation: Secondary | ICD-10-CM | POA: Diagnosis not present

## 2020-11-11 DIAGNOSIS — I10 Essential (primary) hypertension: Secondary | ICD-10-CM | POA: Insufficient documentation

## 2020-11-11 DIAGNOSIS — Z8051 Family history of malignant neoplasm of kidney: Secondary | ICD-10-CM | POA: Insufficient documentation

## 2020-11-11 DIAGNOSIS — Z85528 Personal history of other malignant neoplasm of kidney: Secondary | ICD-10-CM | POA: Diagnosis not present

## 2020-11-11 DIAGNOSIS — G4730 Sleep apnea, unspecified: Secondary | ICD-10-CM | POA: Diagnosis not present

## 2020-11-11 DIAGNOSIS — Z79899 Other long term (current) drug therapy: Secondary | ICD-10-CM | POA: Insufficient documentation

## 2020-11-11 DIAGNOSIS — Z98891 History of uterine scar from previous surgery: Secondary | ICD-10-CM | POA: Insufficient documentation

## 2020-11-11 DIAGNOSIS — Z800 Family history of malignant neoplasm of digestive organs: Secondary | ICD-10-CM | POA: Insufficient documentation

## 2020-11-11 HISTORY — PX: POLYPECTOMY: SHX5525

## 2020-11-11 HISTORY — PX: HYSTEROSCOPY WITH D & C: SHX1775

## 2020-11-11 SURGERY — DILATATION AND CURETTAGE /HYSTEROSCOPY
Anesthesia: General | Site: Vagina

## 2020-11-11 MED ORDER — DEXAMETHASONE SODIUM PHOSPHATE 10 MG/ML IJ SOLN
INTRAMUSCULAR | Status: AC
  Filled 2020-11-11: qty 1

## 2020-11-11 MED ORDER — FENTANYL CITRATE (PF) 250 MCG/5ML IJ SOLN
INTRAMUSCULAR | Status: DC | PRN
  Administered 2020-11-11: 50 ug via INTRAVENOUS
  Administered 2020-11-11: 25 ug via INTRAVENOUS

## 2020-11-11 MED ORDER — CHLORHEXIDINE GLUCONATE 0.12 % MT SOLN: 15.0000 mL | Freq: Once | OROMUCOSAL | Status: DC

## 2020-11-11 MED ORDER — MIDAZOLAM HCL 2 MG/2ML IJ SOLN
INTRAMUSCULAR | Status: DC | PRN
  Administered 2020-11-11: 2 mg via INTRAVENOUS

## 2020-11-11 MED ORDER — LACTATED RINGERS IV SOLN: INTRAVENOUS | Status: DC

## 2020-11-11 MED ORDER — HYDROMORPHONE HCL 1 MG/ML IJ SOLN: 0.2500 mg | INTRAMUSCULAR | Status: DC | PRN

## 2020-11-11 MED ORDER — LIDOCAINE HCL (CARDIAC) PF 100 MG/5ML IV SOSY
PREFILLED_SYRINGE | INTRAVENOUS | Status: DC | PRN
  Administered 2020-11-11: 60 mg via INTRAVENOUS

## 2020-11-11 MED ORDER — LIDOCAINE HCL (PF) 2 % IJ SOLN
INTRAMUSCULAR | Status: AC
  Filled 2020-11-11: qty 5

## 2020-11-11 MED ORDER — PROPOFOL 10 MG/ML IV BOLUS
INTRAVENOUS | Status: DC | PRN
  Administered 2020-11-11: 200 mg via INTRAVENOUS

## 2020-11-11 MED ORDER — FENTANYL CITRATE (PF) 100 MCG/2ML IJ SOLN
INTRAMUSCULAR | Status: AC
  Filled 2020-11-11: qty 2

## 2020-11-11 MED ORDER — LIDOCAINE-EPINEPHRINE 0.5 %-1:200000 IJ SOLN
INTRAMUSCULAR | Status: AC
  Filled 2020-11-11: qty 1

## 2020-11-11 MED ORDER — ONDANSETRON HCL 4 MG/2ML IJ SOLN
INTRAMUSCULAR | Status: DC | PRN
  Administered 2020-11-11: 4 mg via INTRAVENOUS

## 2020-11-11 MED ORDER — ONDANSETRON HCL 4 MG/2ML IJ SOLN: 4.0000 mg | Freq: Once | INTRAMUSCULAR | Status: DC | PRN

## 2020-11-11 MED ORDER — LIDOCAINE-EPINEPHRINE 0.5 %-1:200000 IJ SOLN
INTRAMUSCULAR | Status: DC | PRN
  Administered 2020-11-11: 24 mL

## 2020-11-11 MED ORDER — ONDANSETRON HCL 4 MG/2ML IJ SOLN
INTRAMUSCULAR | Status: AC
  Filled 2020-11-11: qty 2

## 2020-11-11 MED ORDER — MEPERIDINE HCL 50 MG/ML IJ SOLN: 6.2500 mg | INTRAMUSCULAR | Status: DC | PRN

## 2020-11-11 MED ORDER — POVIDONE-IODINE 10 % EX SWAB: 2.0000 "application " | Freq: Once | CUTANEOUS | Status: DC

## 2020-11-11 MED ORDER — ORAL CARE MOUTH RINSE: 15.0000 mL | Freq: Once | OROMUCOSAL | Status: DC

## 2020-11-11 MED ORDER — DEXAMETHASONE SODIUM PHOSPHATE 10 MG/ML IJ SOLN
INTRAMUSCULAR | Status: DC | PRN
  Administered 2020-11-11: 10 mg via INTRAVENOUS

## 2020-11-11 MED ORDER — PROPOFOL 10 MG/ML IV BOLUS
INTRAVENOUS | Status: AC
  Filled 2020-11-11: qty 40

## 2020-11-11 MED ORDER — SODIUM CHLORIDE 0.9 % IR SOLN
Status: DC | PRN
  Administered 2020-11-11: 1000 mL
  Administered 2020-11-11: 3000 mL

## 2020-11-11 MED ORDER — SILVER NITRATE-POT NITRATE 75-25 % EX MISC
CUTANEOUS | Status: DC | PRN
  Administered 2020-11-11: 1

## 2020-11-11 MED ORDER — EPHEDRINE SULFATE 50 MG/ML IJ SOLN
INTRAMUSCULAR | Status: DC | PRN
  Administered 2020-11-11: 5 mg via INTRAVENOUS
  Administered 2020-11-11: 10 mg via INTRAVENOUS
  Administered 2020-11-11: 5 mg via INTRAVENOUS

## 2020-11-11 MED ORDER — MIDAZOLAM HCL 2 MG/2ML IJ SOLN
INTRAMUSCULAR | Status: AC
  Filled 2020-11-11: qty 2

## 2020-11-11 SURGICAL SUPPLY — 19 items
CATH ROBINSON RED A/P 16FR (CATHETERS) ×2 IMPLANT
DEVICE MYOSURE REACH (MISCELLANEOUS) ×1 IMPLANT
GAUZE 4X4 16PLY ~~LOC~~+RFID DBL (SPONGE) ×1 IMPLANT
GLOVE SURG LTX SZ6.5 (GLOVE) ×2 IMPLANT
GLOVE SURG UNDER POLY LF SZ7 (GLOVE) ×4 IMPLANT
GOWN STRL REUS W/ TWL LRG LVL3 (GOWN DISPOSABLE) ×2 IMPLANT
GOWN STRL REUS W/TWL LRG LVL3 (GOWN DISPOSABLE) ×4
IV NS 1000ML (IV SOLUTION) ×2
IV NS 1000ML BAXH (IV SOLUTION) IMPLANT
IV NS IRRIG 3000ML ARTHROMATIC (IV SOLUTION) ×1 IMPLANT
KIT PROCEDURE FLUENT (KITS) ×2 IMPLANT
KIT TURNOVER KIT B (KITS) ×2 IMPLANT
NDL HYPO 18GX1.5 BLUNT FILL (NEEDLE) IMPLANT
NEEDLE HYPO 18GX1.5 BLUNT FILL (NEEDLE) ×2 IMPLANT
PACK VAGINAL MINOR WOMEN LF (CUSTOM PROCEDURE TRAY) ×2 IMPLANT
SEAL ROD LENS SCOPE MYOSURE (ABLATOR) ×2 IMPLANT
SYR 30ML LL (SYRINGE) ×1 IMPLANT
TOWEL OR 17X26 4PK STRL BLUE (TOWEL DISPOSABLE) ×1 IMPLANT
UNDERPAD 30X36 HEAVY ABSORB (UNDERPADS AND DIAPERS) ×1 IMPLANT

## 2020-11-11 NOTE — Transfer of Care (Signed)
Immediate Anesthesia Transfer of Care Note  Patient: Jennifer Harvey  Procedure(s) Performed: DILATATION AND CURETTAGE /HYSTEROSCOPY (Vagina ) Myosure POLYPECTOMY (Vagina )  Patient Location: PACU  Anesthesia Type:General  Level of Consciousness: drowsy  Airway & Oxygen Therapy: Patient Spontanous Breathing and Patient connected to nasal cannula oxygen  Post-op Assessment: Report given to RN and Post -op Vital signs reviewed and stable  Post vital signs: Reviewed and stable  Last Vitals:  Vitals Value Taken Time  BP    Temp    Pulse 95 11/11/20 0810  Resp 20 11/11/20 0810  SpO2 91 % 11/11/20 0810  Vitals shown include unvalidated device data.  Last Pain:  Vitals:   11/11/20 0650  TempSrc: Oral  PainSc: 0-No pain      Patients Stated Pain Goal: 5 ([id number])  Complications: No notable events documented.

## 2020-11-11 NOTE — Anesthesia Postprocedure Evaluation (Signed)
Anesthesia Post Note  Patient: Jennifer Harvey  Procedure(s) Performed: DILATATION AND CURETTAGE /HYSTEROSCOPY (Vagina ) Myosure POLYPECTOMY (Vagina )  Patient location during evaluation: PACU Anesthesia Type: General Level of consciousness: awake and alert and oriented Pain management: pain level controlled Vital Signs Assessment: post-procedure vital signs reviewed and stable Respiratory status: spontaneous breathing and respiratory function stable Cardiovascular status: blood pressure returned to baseline and stable Postop Assessment: no apparent nausea or vomiting Anesthetic complications: no   No notable events documented.   Last Vitals:  Vitals:   11/11/20 0845 11/11/20 0900  BP: 103/76 140/83  Pulse: 83 81  Resp: 16 16  Temp:  36.6 C  SpO2: 93% 95%    Last Pain:  Vitals:   11/11/20 0900  TempSrc: Oral  PainSc: 0-No pain                 [name] [name]

## 2020-11-11 NOTE — Anesthesia Procedure Notes (Signed)
Procedure Name: LMA Insertion Date/Time: 11/11/2020 7:35 AM Performed by: Karna Dupes, CRNA Pre-anesthesia Checklist: Patient identified, Emergency Drugs available, Suction available and Patient being monitored Patient Re-evaluated:Patient Re-evaluated prior to induction Oxygen Delivery Method: Circle system utilized Preoxygenation: Pre-oxygenation with 100% oxygen Induction Type: IV induction LMA: LMA inserted LMA Size: 4.0 Number of attempts: 1 Tube secured with: Tape Dental Injury: Teeth and Oropharynx as per pre-operative assessment

## 2020-11-11 NOTE — Interval H&P Note (Signed)
History and Physical Interval Note:  11/11/2020 7:15 AM  Jennifer Harvey  has presented today for surgery, with the diagnosis of Postmenopausal Bleeding, Uterine Polyp.  The various methods of treatment have been discussed with the patient and family. After consideration of risks, benefits and other options for treatment, the patient has consented to  Procedure(s) with comments: DILATATION AND CURETTAGE /HYSTEROSCOPY (N/A) - pt knows to arrive at 6:15 Myosure POLYPECTOMY (N/A) as a surgical intervention.  The patient's history has been reviewed, patient examined, no change in status, stable for surgery.  I have reviewed the patient's chart and labs.  Questions were answered to the patient's satisfaction.     Annalee Genta

## 2020-11-11 NOTE — Op Note (Addendum)
Operative Report  PreOp: 1) Endometrial mass 2) postmenopausal bleeding PostOp: same Procedure:  Hysteroscopy, Dilation and Curettage, Myosure resection of mass Surgeon: Dr. Janyth Pupa Anesthesia: General Complications:none EBL: Minimal UOP: 25cc IVF:400cc Discrepancy: 25cc  Findings: 8.5cm uterus with 1.5cm mass- suggestive of uterine fibroid, both ostia visualized  Specimens: endometrial mass  Procedure: The patient was taken to the operating room where she underwent general anesthesia without difficulty. The patient was placed in a low lithotomy position using Allen stirrups. The patient was examined with the findings as noted above.  She was then prepped and draped in the normal sterile fashion. The bladder was drained using a red rubber urethral catheter. A sterile speculum was inserted into the vagina. Cervical block was completed using 1% lidocaine with epinephrine.   A single tooth tenaculum was placed on the anterior lip of the cervix.  The uterus was then sounded to 8.5. Cervix already appeared dilated.  The diagnostic hysteroscope was then inserted without difficulty and noted to have the findings as listed above. Visualization was achieved using NS as a distending medium. The myosure was inserted and resection was performed.  Based on the appearance and toughness of tissue, findings suggestive of uterine leiomyoma.  The tissue was sent to pathology. No uterine perforation was seen. All instrument were then removed. Hemostasis was observed at the cervical site using silver nitrazine. The patient was repositioned to the supine position. The patient tolerated the procedure without any complications and taken to recovery in stable condition.   Janyth Pupa, DO Attending [place], [facility] for [employer], [place]

## 2020-11-11 NOTE — Anesthesia Preprocedure Evaluation (Signed)
Anesthesia Evaluation  Patient identified by MRN, date of birth, ID band Patient awake    Reviewed: Allergy & Precautions, NPO status , Patient's Chart, lab work & pertinent test results  Airway Mallampati: III  TM Distance: >3 FB Neck ROM: Full    Dental  (+) Dental Advisory Given, Caps,    Pulmonary sleep apnea (Non complaint with CPAP) ,    Pulmonary exam normal breath sounds clear to auscultation       Cardiovascular Exercise Tolerance: Good hypertension, Pt. on medications Normal cardiovascular exam Rhythm:Regular Rate:Normal     Neuro/Psych negative neurological ROS  negative psych ROS   GI/Hepatic negative GI ROS, Neg liver ROS,   Endo/Other  negative endocrine ROS  Renal/GU negative Renal ROS     Musculoskeletal negative musculoskeletal ROS (+)   Abdominal   Peds  Hematology negative hematology ROS (+)   Anesthesia Other Findings   Reproductive/Obstetrics negative OB ROS                            Anesthesia Physical Anesthesia Plan  ASA: 2  Anesthesia Plan: General   Post-op Pain Management:    Induction: Intravenous  PONV Risk Score and Plan: 4 or greater and Ondansetron, Dexamethasone and Midazolam  Airway Management Planned: LMA  Additional Equipment:   Intra-op Plan:   Post-operative Plan: Extubation in OR  Informed Consent: I have reviewed the patients History and Physical, chart, labs and discussed the procedure including the risks, benefits and alternatives for the proposed anesthesia with the patient or authorized representative who has indicated his/her understanding and acceptance.     Dental advisory given  Plan Discussed with: CRNA and Surgeon  Anesthesia Plan Comments:         Anesthesia Quick Evaluation

## 2020-11-11 NOTE — Discharge Instructions (Addendum)
HOME INSTRUCTIONS  Please note any unusual or excessive bleeding, pain, swelling. Mild dizziness or drowsiness are normal for about 24 hours after surgery.   Shower when comfortable  Restrictions: No driving for 24 hours   Activity:  No heavy lifting (> 10 lbs), nothing in vagina (no tampons, douching, or intercourse) x 1 week; no tub baths for 1 weeks Vaginal spotting is expected but if your bleeding is heavy, period like,  please call the office   Incision: the bandaids will fall off when they are ready to; you may clean your incision with mild soap and water but do not rub or scrub the incision site.  You may experience slight bloody drainage from your incision periodically.  This is normal.  If you experience a large amount of drainage or the incision opens, please call your physician who will likely direct you to the emergency department.  Diet:  You may return to your regular diet.  Do not eat large meals.  Eat small frequent meals throughout the day.  Continue to drink a good amount of water at least 6-8 glasses of water per day, hydration is very important for the healing process.  Pain Management: Take over the counter tylenol as needed.  Always take prescription pain medication with food, it may cause constipation, increase fluids and fiber and you may want to take an over-the-counter stool softener like Colace as needed up to 2x a day.    Alcohol -- Avoid for 24 hours and while taking pain medications.  Nausea: Take sips of ginger ale or soda  Fever -- Call physician if temperature over 101 degrees  Follow up:  If you experience fever (a temperature greater than 100.4), pain unrelieved by pain medication, shortness of breath, swelling of a single leg, or any other symptoms which are concerning to you please the office immediately. PATIENT INSTRUCTIONS POST-ANESTHESIA  IMMEDIATELY FOLLOWING SURGERY:  Do not drive or operate machinery for the first twenty four hours after  surgery.  Do not make any important decisions for twenty four hours after surgery or while taking narcotic pain medications or sedatives.  If you develop intractable nausea and vomiting or a severe headache please notify your doctor immediately.  FOLLOW-UP:  Please make an appointment with your surgeon as instructed. You do not need to follow up with anesthesia unless specifically instructed to do so.  WOUND CARE INSTRUCTIONS (if applicable):  Keep a dry clean dressing on the anesthesia/puncture wound site if there is drainage.  Once the wound has quit draining you may leave it open to air.  Generally you should leave the bandage intact for twenty four hours unless there is drainage.  If the epidural site drains for more than 36-48 hours please call the anesthesia department.  QUESTIONS?:  Please feel free to call your physician or the hospital operator if you have any questions, and they will be happy to assist you.

## 2020-11-12 ENCOUNTER — Encounter (HOSPITAL_COMMUNITY): Payer: Self-pay | Admitting: Obstetrics & Gynecology

## 2020-11-12 LAB — SURGICAL PATHOLOGY

## 2020-11-14 NOTE — Progress Notes (Signed)
ADDENDUM TO H&P on 8/30- late entry  O:  Gen: NAD Psych: mood appropriate normal behavior Neck: supple, no masses CV: RRR Lungs: CTAB Abd: soft and non-tender Pelvic: deferred Ext; no edema, no calf tenderness  Janyth Pupa, DO Attending [place], [place] for [employer], [place]

## 2020-11-24 ENCOUNTER — Encounter: Payer: Medicare Other | Admitting: Obstetrics & Gynecology

## 2020-11-26 ENCOUNTER — Ambulatory Visit (INDEPENDENT_AMBULATORY_CARE_PROVIDER_SITE_OTHER): Payer: BC Managed Care – PPO | Admitting: Obstetrics & Gynecology

## 2020-11-26 ENCOUNTER — Other Ambulatory Visit: Payer: Self-pay

## 2020-11-26 ENCOUNTER — Encounter: Payer: Self-pay | Admitting: Obstetrics & Gynecology

## 2020-11-26 VITALS — BP 121/76 | HR 93 | Ht 66.5 in | Wt 191.8 lb

## 2020-11-26 DIAGNOSIS — N840 Polyp of corpus uteri: Secondary | ICD-10-CM

## 2020-11-26 DIAGNOSIS — Z4889 Encounter for other specified surgical aftercare: Secondary | ICD-10-CM

## 2020-11-26 NOTE — Progress Notes (Signed)
POSTOP VISIT  Patient returns for routine postoperative follow-up having undergone Hysteroscopy, polypectomy (myomectomy) on 8/30 .  The patient's immediate postoperative recovery has been unremarkable. Since hospital discharge the patient reports that she has been doing well.  Noted a little light spotting, which has now resolved.  Denies heavy bleeding, irregular discharge. Denies pelvic or abdominal pain.  No acute complaints.   Current Outpatient Medications: amLODipine (NORVASC) 5 MG tablet, Take 5 mg by mouth daily. , Disp: , Rfl:  benazepril-hydrochlorthiazide (LOTENSIN HCT) 5-6.25 MG tablet, Take 1 tablet by mouth daily., Disp: , Rfl:  citalopram (CELEXA) 40 MG tablet, Take 40 mg by mouth daily., Disp: , Rfl:  dicyclomine (BENTYL) 20 MG tablet, Take 1 tablet (20 mg total) by mouth 2 (two) times daily., Disp: 20 tablet, Rfl: 0 famotidine (PEPCID) 20 MG tablet, Take 20 mg by mouth 2 (two) times daily., Disp: , Rfl:  gabapentin (NEURONTIN) 100 MG capsule, Take 100 mg by mouth in the morning and at bedtime., Disp: , Rfl:  Multiple Vitamin (MULTIVITAMIN) tablet, Take 1 tablet by mouth daily., Disp: , Rfl:  tamoxifen (NOLVADEX) 20 MG tablet, Take 20 mg by mouth daily., Disp: , Rfl:   benzonatate (TESSALON) 100 MG capsule, Take 1 capsule (100 mg total) by mouth every 8 (eight) hours., Disp: 21 capsule, Rfl: 0 ondansetron (ZOFRAN) 4 MG tablet, Take 1 tablet (4 mg total) by mouth every 6 (six) hours. (Patient not taking: Reported on 11/26/2020), Disp: 12 tablet, Rfl: 0  PHYSICAL: BP 121/76 (BP Location: Left Arm, Patient Position: Sitting, Cuff Size: Normal)   Pulse 93   Ht 5' 6.5" (1.689 m)   Wt 191 lb 12.8 oz (87 kg)   LMP 04/29/2020 (Within Weeks) Comment: Bleeding  BMI 30.49 kg/m       Physical Examination:   General appearance: alert, well appearing, and in no distress  Psych: mood appropriate, normal affect  Skin: warm & dry   Cardiovascular: normal heart rate  noted  Respiratory: normal respiratory effort, no distress  Abdomen: soft, non-tender   Pelvic: normal external genitalia, vulva, vagina, cervix, uterus and adnexa  Extremities: no edema   Pathology: Benign endometrial polyp and benign smooth muscle  Impression: Postmenopausal bleeding, uterine polyp/leiomyoma   Plan: -meeting postop milestones appropriately -f/u should bleeding return or 49yrannual   Follow up: Return in about 1 year (around 11/26/2021) for Annual- May 2023.   JAnnalee Genta DO

## 2021-01-01 DIAGNOSIS — M7731 Calcaneal spur, right foot: Secondary | ICD-10-CM | POA: Diagnosis not present

## 2021-01-01 DIAGNOSIS — M79671 Pain in right foot: Secondary | ICD-10-CM | POA: Diagnosis not present

## 2021-01-01 DIAGNOSIS — M722 Plantar fascial fibromatosis: Secondary | ICD-10-CM | POA: Diagnosis not present

## 2021-01-01 DIAGNOSIS — M71571 Other bursitis, not elsewhere classified, right ankle and foot: Secondary | ICD-10-CM | POA: Diagnosis not present

## 2021-02-11 ENCOUNTER — Ambulatory Visit: Payer: Medicare Other | Admitting: Nurse Practitioner

## 2021-04-06 DIAGNOSIS — R3129 Other microscopic hematuria: Secondary | ICD-10-CM | POA: Diagnosis not present

## 2021-04-06 DIAGNOSIS — E119 Type 2 diabetes mellitus without complications: Secondary | ICD-10-CM | POA: Diagnosis not present

## 2021-05-06 ENCOUNTER — Emergency Department (HOSPITAL_COMMUNITY)
Admission: EM | Admit: 2021-05-06 | Discharge: 2021-05-07 | Disposition: A | Discharge status: Skilled Nursing Facility | Payer: BC Managed Care – PPO | Attending: Emergency Medicine | Admitting: Emergency Medicine

## 2021-05-06 ENCOUNTER — Emergency Department (HOSPITAL_COMMUNITY): Payer: BC Managed Care – PPO

## 2021-05-06 ENCOUNTER — Encounter (HOSPITAL_COMMUNITY): Payer: Self-pay

## 2021-05-06 ENCOUNTER — Other Ambulatory Visit: Payer: Self-pay

## 2021-05-06 DIAGNOSIS — R002 Palpitations: Secondary | ICD-10-CM | POA: Insufficient documentation

## 2021-05-06 DIAGNOSIS — Z79899 Other long term (current) drug therapy: Secondary | ICD-10-CM | POA: Insufficient documentation

## 2021-05-06 DIAGNOSIS — I10 Essential (primary) hypertension: Secondary | ICD-10-CM | POA: Diagnosis not present

## 2021-05-06 DIAGNOSIS — Z853 Personal history of malignant neoplasm of breast: Secondary | ICD-10-CM | POA: Insufficient documentation

## 2021-05-06 LAB — TROPONIN I (HIGH SENSITIVITY)
Troponin I (High Sensitivity): 3 ng/L (ref <18)
Troponin I (High Sensitivity): 3 ng/L (ref <18)

## 2021-05-06 LAB — CBC
HCT: 38 % (ref 36.0–46.0)
Hemoglobin: 12.8 g/dL (ref 12.0–15.0)
MCH: 30.5 pg (ref 26.0–34.0)
MCHC: 33.7 g/dL (ref 30.0–36.0)
MCV: 90.5 fL (ref 80.0–100.0)
Platelets: 242 10*3/uL (ref 150–400)
RBC: 4.2 MIL/uL (ref 3.87–5.11)
RDW: 13.5 % (ref 11.5–15.5)
WBC: 5 10*3/uL (ref 4.0–10.5)
nRBC: 0 % (ref 0.0–0.2)

## 2021-05-06 LAB — BASIC METABOLIC PANEL
Anion gap: 8 (ref 5–15)
BUN: 11 mg/dL (ref 6–20)
CO2: 25 mmol/L (ref 22–32)
Calcium: 8.9 mg/dL (ref 8.9–10.3)
Chloride: 105 mmol/L (ref 98–111)
Creatinine, Ser: 0.62 mg/dL (ref 0.44–1.00)
GFR, Estimated: >60 mL/min (ref >60)
Glucose, Bld: 95 mg/dL (ref 70–99)
Potassium: 3.4 mmol/L — ABNORMAL LOW (ref 3.5–5.1)
Sodium: 138 mmol/L (ref 135–145)

## 2021-05-06 LAB — MAGNESIUM: Magnesium: 2 mg/dL (ref 1.7–2.4)

## 2021-05-06 MED ORDER — POTASSIUM CHLORIDE CRYS ER 20 MEQ PO TBCR: 40.0000 meq | EXTENDED_RELEASE_TABLET | Freq: Every day | ORAL | 0 refills | Status: DC

## 2021-05-06 NOTE — ED Provider Notes (Signed)
Emergency Department Provider Note  I have reviewed the triage vital signs and the nursing notes.  HISTORY  Chief Complaint Palpitations   HPI Jennifer Harvey is a 61 y.o. female with past medical history of breast cancer, renal cell carcinoma status post nephrectomy, hypertension who presents to the emergency department today secondary to palpitations.  Patient states that it started today.  She had multiple episodes where she felt like her heart skipped a beat.  She states that she is never had any had this before.  She told nursing in her triage note that she has shortness of breath but she denies this to me.  She states she is not feeling any dyspnea, lightheadedness, diaphoresis maybe had a little bit of chest pressure while was going on.  Normally last 1 to 2 seconds at a time.  She states she drinks 3-4 beers recent recently secondary to stress with her husband.  She smokes marijuana few times a day but that is not new.  She has increased her caffeine intake and she has also been diagnosed with sleep apnea but has not been started on treatment yet.  No other new medication changes.  No other new over-the-counter medications.  She states that she has gained about 10 pounds in the last year but attributes that to the alcohol.  No recent infections.  She does have lower extremity swelling but is only there at the end of the day as she works standing up and driving.  When she wakes up in the morning is gone.  Not asymmetric.  PMH Past Medical History:  Diagnosis Date   Breast cancer ([place])    Cancer ([place])    renal cell carcinoma 2007 s/p partial right nephrectomy    History of kidney cancer    Hypertension    Personal history of chemotherapy    Personal history of radiation therapy    Sleep apnea     Home Medications Prior to Admission medications   Medication Sig Start Date End Date Taking? Authorizing Provider  potassium chloride SA (KLOR-CON M) 20 MEQ tablet Take 2 tablets  (40 mEq total) by mouth daily for 7 days. 05/06/21 05/13/21 Yes Cicley Ganesh, Corene Cornea, MD  amLODipine (NORVASC) 5 MG tablet Take 5 mg by mouth daily.  07/29/17   [provider]  benazepril-hydrochlorthiazide (LOTENSIN HCT) 5-6.25 MG tablet Take 1 tablet by mouth daily.    [provider]  benzonatate (TESSALON) 100 MG capsule Take 1 capsule (100 mg total) by mouth every 8 (eight) hours. 03/16/20   Ripley Fraise, MD  citalopram (CELEXA) 40 MG tablet Take 40 mg by mouth daily. 09/16/20   [provider]  dicyclomine (BENTYL) 20 MG tablet Take 1 tablet (20 mg total) by mouth 2 (two) times daily. 12/16/19   Couture, Cortni S, PA-C  famotidine (PEPCID) 20 MG tablet Take 20 mg by mouth 2 (two) times daily. 10/09/19   [provider]  gabapentin (NEURONTIN) 100 MG capsule Take 100 mg by mouth in the morning and at bedtime. 10/09/19   [provider]  Multiple Vitamin (MULTIVITAMIN) tablet Take 1 tablet by mouth daily.    [provider]  ondansetron (ZOFRAN) 4 MG tablet Take 1 tablet (4 mg total) by mouth every 6 (six) hours. Patient not taking: Reported on 11/26/2020 10/31/20   Marcello Fennel, PA-C  tamoxifen (NOLVADEX) 20 MG tablet Take 20 mg by mouth daily. 12/10/19   [provider]    Social History Social History  Tobacco Use   Smoking status: Never   Smokeless tobacco: Never  Vaping Use   Vaping Use: Never used  Substance Use Topics   Alcohol use: Yes    Alcohol/week: 4.0 standard drinks    Types: 4 Cans of beer per week    Comment: 4 beers per day   Drug use: Yes    Types: Marijuana    Review of Systems: Documented in HPI ____________________________________________  PHYSICAL EXAM: VITAL SIGNS: ED Triage Vitals  Enc Vitals Group     BP 05/06/21 1827 (!) 156/90     Pulse Rate 05/06/21 1827 67     Resp 05/06/21 1827 20     Temp 05/06/21 1827 98.6 F (37 C)     Temp Source 05/06/21 1827 Oral     SpO2 05/06/21 1827 98 %      Weight 05/06/21 1825 187 lb (84.8 kg)     Height 05/06/21 1825 5\' 6"  (1.676 m)   Physical Exam Vitals and nursing note reviewed.  Constitutional:      Appearance: She is well-developed.  HENT:     Head: Normocephalic and atraumatic.     Mouth/Throat:     Mouth: Mucous membranes are moist.  Eyes:     Pupils: Pupils are equal, round, and reactive to light.  Cardiovascular:     Rate and Rhythm: Normal rate and regular rhythm.  Pulmonary:     Effort: No respiratory distress.     Breath sounds: No stridor.  Abdominal:     General: There is no distension.  Musculoskeletal:        General: No swelling or tenderness. Normal range of motion.     Cervical back: Normal range of motion.  Skin:    General: Skin is warm and dry.  Neurological:     General: No focal deficit present.     Mental Status: She is alert.      ____________________________________________   LABS (all labs ordered are listed, but only abnormal results are displayed)  Labs Reviewed  BASIC METABOLIC PANEL - Abnormal; Notable for the following components:      Result Value   Potassium 3.4 (*)    All other components within normal limits  CBC  MAGNESIUM  TROPONIN I (HIGH SENSITIVITY)  TROPONIN I (HIGH SENSITIVITY)   ____________________________________________  EKG   EKG Interpretation  Date/Time:  Wednesday May 06 2021 18:33:07 EST Ventricular Rate:  69 PR Interval:  156 QRS Duration: 90 QT Interval:  448 QTC Calculation: 480 R Axis:   40 Text Interpretation: Normal sinus rhythm Prolonged QT Abnormal ECG When compared with ECG of 05-Mar-2020 14:31, Borderline criteria for Inferior infarct are no longer Present Nonspecific T wave abnormality has replaced inverted T waves in Inferior leads Nonspecific T wave abnormality no longer evident in Lateral leads Confirmed by Merrily Pew [phone]) on 05/06/2021 11:02:40 PM        ____________________________________________  RADIOLOGY  DG Chest 2  View  Result Date: 05/06/2021 CLINICAL DATA:  Palpitations. EXAM: CHEST - 2 VIEW COMPARISON:  None. FINDINGS: The heart size and mediastinal contours are within normal limits. Both lungs are clear. The visualized skeletal structures are unremarkable. IMPRESSION: No active cardiopulmonary disease. Electronically Signed   By: Virgina Norfolk M.D.   On: 05/06/2021 18:56   ____________________________________________  PROCEDURES  Procedure(s) performed:   Procedures ____________________________________________  INITIAL IMPRESSION / ASSESSMENT AND PLAN   This patient presents to the ED for concern of palpitations, this involves an extensive number  of treatment options, and is a complaint that carries with it a high risk of complications and morbidity.  The differential diagnosis includes atrial arrhythmias (PAC, PVC, Afib), Vtach, Vfib.  K slightly low, doubt it is related but will supplement it at home. Mg normal. Cardiac monitor review normal without obvious arrhythmias. Suspect likely PACs.   Additional history obtained:  Additional history obtained from n/a Previous records obtained and reviewed in epic  Co morbidities that complicate the patient evaluation  Obesity, sleep apnea, substance use  Social Determinants of Health:  Difficult relationship right now.   ED Course  Images ordered viewed and obtained by myself. Agree with Radiology interpretation. Details in ED course.  Labs ordered reviewed by myself as detailed in ED course.  Consultations obtained/considered detailed in ED course.        Cardiac Monitoring:  The patient was maintained on a cardiac monitor.  I personally viewed and interpreted the cardiac monitored which showed an underlying rhythm of: sinus rhythm at 63  CRITICAL INTERVENTIONS:  N/a  Reevaluation:  After the interventions noted above, I reevaluated the patient and found that they have :stayed the same. Will d/c to change her lifestyle issues  and sleep apnea and if that doesn't help then follow up with cardiology.   FINAL IMPRESSION AND PLAN Final diagnoses:  Palpitations    A medical screening exam was performed and I feel the patient has had an appropriate workup for their chief complaint at this time and likelihood of emergent condition existing is low. They have been counseled on decision, DISCHARGE, follow up and which symptoms necessitate immediate return to the emergency department. They or their family verbally stated understanding and agreement with plan and discharged in stable condition.   ____________________________________________   NEW OUTPATIENT MEDICATIONS STARTED DURING THIS VISIT:  Discharge Medication List as of 05/07/2021 12:00 AM     START taking these medications   Details  potassium chloride SA (KLOR-CON M) 20 MEQ tablet Take 2 tablets (40 mEq total) by mouth daily for 7 days., Starting Wed 05/06/2021, Until Wed 05/13/2021, Print        Note:  This note was prepared with assistance of Dragon voice recognition software. Occasional wrong-word or sound-a-like substitutions may have occurred due to the inherent limitations of voice recognition software.    Rivaan Kendall, Corene Cornea, MD 05/07/21 [id number]

## 2021-05-06 NOTE — ED Triage Notes (Signed)
Pt presents to ED with complaints of palpitations. Pt states started today and makes her feel like she can't catch her breath.

## 2021-05-07 NOTE — Discharge Instructions (Signed)
Reduce alcohol, caffeine, stress and work on sleep apnea treatment. If these things don't help please call cardiology for appointment.

## 2021-05-27 ENCOUNTER — Ambulatory Visit: Payer: BC Managed Care – PPO | Admitting: Internal Medicine

## 2021-05-28 ENCOUNTER — Inpatient Hospital Stay (HOSPITAL_COMMUNITY): Payer: BC Managed Care – PPO | Attending: Hematology | Admitting: Hematology

## 2021-05-28 ENCOUNTER — Other Ambulatory Visit: Payer: Self-pay

## 2021-05-28 VITALS — BP 152/90 | HR 83 | Temp 98.5°F | Resp 18 | Ht 65.0 in | Wt 200.1 lb

## 2021-05-28 DIAGNOSIS — Z79899 Other long term (current) drug therapy: Secondary | ICD-10-CM | POA: Insufficient documentation

## 2021-05-28 DIAGNOSIS — Z800 Family history of malignant neoplasm of digestive organs: Secondary | ICD-10-CM

## 2021-05-28 DIAGNOSIS — N289 Disorder of kidney and ureter, unspecified: Secondary | ICD-10-CM | POA: Diagnosis not present

## 2021-05-28 DIAGNOSIS — C50211 Malignant neoplasm of upper-inner quadrant of right female breast: Secondary | ICD-10-CM | POA: Diagnosis not present

## 2021-05-28 DIAGNOSIS — Z801 Family history of malignant neoplasm of trachea, bronchus and lung: Secondary | ICD-10-CM

## 2021-05-28 DIAGNOSIS — Z170 Estrogen receptor positive status [ER+]: Secondary | ICD-10-CM

## 2021-05-28 NOTE — Patient Instructions (Addendum)
Jennifer Harvey at [facility] ?Discharge Instructions ? ?You were seen and examined today by Dr. Delton Coombes. Dr. Delton Coombes is a medical oncologist, meaning that he specializes in the treatment of cancer diagnoses. Dr. Delton Coombes discussed your past medical history, family history of cancers, and the events that led to you being here today. ? ?You were referred to Dr. Delton Coombes to establish follow-up care for your previous diagnosis of breast cancer. You should meet with a genetic counselor due to your history. ? ?Continue Tamoxifen as prescribed. For hot flashes, try Black Cohosh. This can be bought over-the-counter at any place where vitamins are sold. ? ?Dr. Delton Coombes has recommended a repeat mammogram 1 year from your last and a bone density scan to establish a baseline. Dr. Delton Coombes will also check your Vitamin D level prior to your next appoinment. You will also have an ultrasound of your kidney prior to your next appointment ? ?Please follow-up with the GYN Provider and continue regular pap smears. ? ?Follow-up with Dr. Delton Coombes in approximately 6 months, following your mammogram. ? ? ?Thank you for choosing [place] at [facility] to provide your oncology and hematology care.  To afford each patient quality time with our provider, please arrive at least 15 minutes before your scheduled appointment time.  ? ?If you have a lab appointment with the [facility] please come in thru the Main Entrance and check in at the main information desk. ? ?You need to re-schedule your appointment should you arrive 10 or more minutes late.  We strive to give you quality time with our providers, and arriving late affects you and other patients whose appointments are after yours.  Also, if you no show three or more times for appointments you may be dismissed from the clinic at the providers discretion.     ?Again, thank you for choosing [facility]  Our hope is  that these requests will decrease the amount of time that you wait before being seen by our physicians.       ?_____________________________________________________________ ? ?Should you have questions after your visit to [facility], please contact our office at [phone] and follow the prompts.  Our office hours are 8:00 a.m. and 4:30 p.m. Monday - Friday.  Please note that voicemails left after 4:00 p.m. may not be returned until the following business day.  We are closed weekends and major holidays.  You do have access to a nurse 24-7, just call the main number to the clinic [phone] and do not press any options, hold on the line and a nurse will answer the phone.   ? ?For prescription refill requests, have your pharmacy contact our office and allow 72 hours.   ? ?Due to Covid, you will need to wear a mask upon entering the hospital. If you do not have a mask, a mask will be given to you at the Main Entrance upon arrival. For doctor visits, patients may have 1 support person age 66 or older with them. For treatment visits, patients can not have anyone with them due to social distancing guidelines and our immunocompromised population.  ? ? ? ?

## 2021-05-28 NOTE — Progress Notes (Signed)
[facility] [address], [place] [address]   Patient Care Team: Lenoria Chime, FNP as PCP - General (Nurse Practitioner) Doreatha Massed, MD as Medical Oncologist (Medical Oncology)  CHIEF COMPLAINTS/PURPOSE OF CONSULTATION:  Establish care for right breast cancer  HISTORY OF PRESENTING ILLNESS:  Jennifer Harvey 61 y.o. female is here to establish care for right breast cancer.  Today she reports feeling good. She is taking tamoxifen and tolerating it well. She denies vaginal bleed and spotting, but she reports uterine polyps. She reports occasional sharp lower abdominal pain while walking. She stopped having menses in October 2019 prior to starting tamoxifen. She denies history of breast biopsies prior to 2019. She reports hot flashes which is worse on her back and forehead, and she reports these can occur 7 times throughout day and night. She reports following starting tamoxifen she lost all of her hair, and it has not regrown. She started tamoxifen following the completion of radiation in December 2019. She reports she constantly hears "crickets" in her ears. She was started on Celexa around the same time as tamoxifen. She has annual pap smears. She reports occasional ankle swellings at the end of the day. She takes potassium. She has a history of kidney cancer diagnosed in 2007 for which she underwent right partial nephrectomy. She reports cramping in her legs at night which goes away upon standing up; she reports this occurs about 2 times a month.   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 Her maternal grandmother had stomach cancer, and her maternal uncle had lung cancer. She drinks 3 beers a day.   In terms of breast cancer risk profile:  She menarched at early age of 62.  She had 4 pregnancies, her first child was born at age 46  She has received birth control  pills for approximately 6 years.  She was never exposed to fertility medications or hormone replacement therapy.  She has family history of Breast/GYN/GI cancer  I reviewed her records extensively and collaborated the history with the patient.  SUMMARY OF ONCOLOGIC HISTORY: Oncology History  Malignant neoplasm of upper-inner quadrant of breast in female, estrogen receptor positive (HCC)  06/07/2017 Mammogram   BILAT SCREENING MAMMOGRAM  There is an irregular focal asymmetry or spiculated mass in the upper inner right breast middle third, located 7-8 cm from the nipple with a single punctate calcification posteriorly.  Additionally there is a small irregular mass or focal asymmetry more anteriorly in the inner central right breast, 5 cm from the nipple.  Between these 2 masses there is a stable grouping of calcifications.  Left breast is negative   06/20/2017 Mammogram   Right unilateral diagnostic mammogram: Redemonstrated irregular masslike focal asymmetry with architectural distortion in the upper inner breast with an associated punctate calcification measuring 1.1 cm.  Anterior to this is a more subtle area of focal architectural distortion.  Overall the area spans approximately 4 cm   06/20/2017 Breast US   Targeted ultrasound of the right breast reveals at 2 o'clock position 3-4 cm from the nipple there is a subtle irregular hypoechoic area with shadowing measuring 8 mm.  Incidentally noted at 3 o'clock position 6 cm from the nipple is an oval well-circumscribed hypoechoic mass measuring 1.6 x 0.8 x 1.1 cm   06/30/2017 Initial Biopsy   [place] needle core biopsy:  Diagnosis: Fibrotic tissue with increased  adenosis, no tumor seen. Immunostains returned.  All glandular elements in the core biopsies are positive for CK 5/6, high molecular weight cytokeratin and E-Cadherin.  There is no focal increase in proliferation with a Ki-67 stain.  Both p63 and smooth muscle myosin exhibit intact basilar and  myoepithelial layers surrounding all glandular elements in the core biopsy sections.  These findings support a benign diagnosis.   07/12/2017 Mammogram   Diagnostic mammogram, post biopsy clip imaging: Biopsy clip is approximately 1.3 cm medial to the suspicious mass which was biopsied on [place]. There is a persistent asymmetry at the site of concern in the medial right breast middle depth. Further biopsy was recommended    08/03/2017 Pathology Results   Diagnosis 1. Breast, right, needle core biopsy, UIQ - INVASIVE MAMMARY CARCINOMA. - MAMMARY CARCINOMA IN SITU. - SEE MICROSCOPIC DESCRIPTION. 2. Breast, right, needle core biopsy, UIQ - INVASIVE MAMMARY CARCINOMA. - MAMMARY CARCINOMA IN SITU. - SEE MICROSCOPIC DESCRIPTION.  Microscopic Comment 1. There is invasive mammary carcinoma which has features suggestive of invasive lobular carcinoma. There is also a separate component which may represent an invasive ductal component. E-Cadherin immunohistochemistry will be performed as well as prognostic profile. 2. E-Cadherin and breast prognostic profile will be performed.  ADDENDUM: 1,2. Immunohistochemistry for E-Cadherin shows an invasive and in situ component that is E-Cadherin negative consistent with lobular carcinoma. There is also a separate morphologic component which is strongly E-Cadherin positive consistent with invasive and in situ ductal carcinoma. Basal cell markers for p63, calponin and smooth muscle myosin are negative in the invasive component supporting the diagnosis. (JDP:kh 08-04-17)  1. PROGNOSTIC INDICATORS Results: IMMUNOHISTOCHEMICAL AND MORPHOMETRIC ANALYSIS PERFORMED MANUALLY Estrogen Receptor: 100%, POSITIVE, STRONG STAINING INTENSITY Progesterone Receptor: 10%, POSITIVE, STRONG STAINING INTENSITY Proliferation Marker Ki67: 20%  1. FLUORESCENCE IN-SITU HYBRIDIZATION Results: HER2 - NEGATIVE RATIO OF HER2/CEP17 SIGNALS 1.41 AVERAGE HER2 COPY NUMBER PER CELL 1.90  2.  PROGNOSTIC INDICATORS Results: IMMUNOHISTOCHEMICAL AND MORPHOMETRIC ANALYSIS PERFORMED MANUALLY Estrogen Receptor: 100%, POSITIVE, STRONG STAINING INTENSITY Progesterone Receptor: 20%, POSITIVE, STRONG STAINING INTENSITY Proliferation Marker Ki67: 20%  2. FLUORESCENCE IN-SITU HYBRIDIZATION Results: HER2 - NEGATIVE RATIO OF HER2/CEP17 SIGNALS 1.25 AVERAGE HER2 COPY NUMBER PER CELL 2.00   08/19/2017 Initial Diagnosis   Malignant neoplasm of upper-inner quadrant of breast in female, estrogen receptor positive (HCC)   08/19/2017 Cancer Staging   Staging form: Breast, AJCC 8th Edition - Clinical stage from 08/19/2017: cT1c, cN0, cM0, ER+, PR+, HER2- - Signed by Pollyann Samples, NP on 08/19/2017      MEDICAL HISTORY:  Past Medical History:  Diagnosis Date   Breast cancer (HCC)    Cancer (HCC)    renal cell carcinoma 2007 s/p partial right nephrectomy    History of kidney cancer    Hypertension    Personal history of chemotherapy    Personal history of radiation therapy    Sleep apnea     SURGICAL HISTORY: Past Surgical History:  Procedure Laterality Date   BREAST LUMPECTOMY Right 2019   BREAST SURGERY     CESAREAN SECTION  1994   HYSTEROSCOPY WITH D & C N/A 03/12/2020   Procedure: Hysteroscopy Uterine Curettage;  Surgeon: Lazaro Arms, MD;  Location: [facility];  Service: Gynecology;  Laterality: N/A;   HYSTEROSCOPY WITH D & C N/A 11/11/2020   Procedure: DILATATION AND CURETTAGE /HYSTEROSCOPY;  Surgeon: Myna Hidalgo, DO;  Location: [facility];  Service: Gynecology;  Laterality: N/A;   POLYPECTOMY N/A 11/11/2020   Procedure: Myosure POLYPECTOMY;  Surgeon: Myna Hidalgo, DO;  Location: [facility];  Service: Gynecology;  Laterality: N/A;    SOCIAL HISTORY: Social History   Socioeconomic History   Marital status: Married    Spouse name: Not on file   Number of children: 4   Years of education: Not on file   Highest education level: Not on file  Occupational History   Occupation:  makes cookies    Employer: [employer]  Tobacco Use   Smoking status: Never   Smokeless tobacco: Never  Vaping Use   Vaping Use: Never used  Substance and Sexual Activity   Alcohol use: Yes    Alcohol/week: 4.0 standard drinks    Types: 4 Cans of beer per week    Comment: 4 beers per day   Drug use: Yes    Types: Marijuana   Sexual activity: Yes    Birth control/protection: Post-menopausal  Other Topics Concern   Not on file  Social History Narrative   Not on file   Social Determinants of Health   Financial Resource Strain: Not on file  Food Insecurity: Not on file  Transportation Needs: Not on file  Physical Activity: Not on file  Stress: Not on file  Social Connections: Not on file  Intimate Partner Violence: Not on file    FAMILY HISTORY: Family History  Problem Relation Age of Onset   Cancer Maternal Grandmother        stomach   Cancer Maternal Grandfather        kidney cancer   Other Father        auto accident    ALLERGIES:  has No Known Allergies.  MEDICATIONS:  Current Outpatient Medications  Medication Sig Dispense Refill   amLODipine (NORVASC) 5 MG tablet Take 5 mg by mouth daily.      benazepril-hydrochlorthiazide (LOTENSIN HCT) 5-6.25 MG tablet Take 1 tablet by mouth daily.     benzonatate (TESSALON) 100 MG capsule Take 1 capsule (100 mg total) by mouth every 8 (eight) hours. 21 capsule 0   citalopram (CELEXA) 40 MG tablet Take 40 mg by mouth daily.     dicyclomine (BENTYL) 20 MG tablet Take 1 tablet (20 mg total) by mouth 2 (two) times daily. 20 tablet 0   gabapentin (NEURONTIN) 100 MG capsule Take 100 mg by mouth in the morning and at bedtime.     Multiple Vitamin (MULTIVITAMIN) tablet Take 1 tablet by mouth daily.     omeprazole (PRILOSEC) 40 MG capsule Take 40 mg by mouth daily.     ondansetron (ZOFRAN) 4 MG tablet Take 1 tablet (4 mg total) by mouth every 6 (six) hours. 12 tablet 0   tamoxifen (NOLVADEX) 20 MG tablet Take 20 mg by mouth daily.      potassium chloride SA (KLOR-CON M) 20 MEQ tablet Take 2 tablets (40 mEq total) by mouth daily for 7 days. 3 tablet 0   No current facility-administered medications for this visit.    REVIEW OF SYSTEMS:   Review of Systems  Constitutional:  Negative for appetite change and fatigue.  Endocrine: Positive for hot flashes.  Genitourinary:  Negative for vaginal bleeding.   Neurological:  Positive for dizziness and headaches.  Psychiatric/Behavioral:  Positive for sleep disturbance.   All other systems reviewed and are negative.  PHYSICAL EXAMINATION: ECOG PERFORMANCE STATUS: 0 - Asymptomatic  Vitals:   05/28/21 0819  BP: (!) 152/90  Pulse: 83  Resp: 18  Temp: 98.5 F (36.9 C)  SpO2: 97%   Filed  Weights   05/28/21 0819  Weight: 200 lb 1.6 oz (90.8 kg)   Physical Exam Vitals reviewed.  Constitutional:      Appearance: Normal appearance. She is obese.  Cardiovascular:     Rate and Rhythm: Normal rate and regular rhythm.     Pulses: Normal pulses.     Heart sounds: Normal heart sounds.  Pulmonary:     Effort: Pulmonary effort is normal.     Breath sounds: Normal breath sounds.  Chest:  Breasts:    Right: Skin change (UIQ lumpectomy scar - small scar thickness in middle) present. No swelling, bleeding, inverted nipple, mass, nipple discharge or tenderness.     Left: No swelling, bleeding, inverted nipple, mass, nipple discharge, skin change or tenderness.  Abdominal:     Palpations: Abdomen is soft. There is no hepatomegaly, splenomegaly or mass.     Tenderness: There is no abdominal tenderness.  Musculoskeletal:     Right lower leg: No edema.     Left lower leg: No edema.  Lymphadenopathy:     Upper Body:     Right upper body: No supraclavicular, axillary or pectoral adenopathy.     Left upper body: No supraclavicular, axillary or pectoral adenopathy.  Neurological:     General: No focal deficit present.     Mental Status: She is alert and oriented to person, place, and  time.  Psychiatric:        Mood and Affect: Mood normal.        Behavior: Behavior normal.    Breast Exam Chaperone: Alda Ponder    LABORATORY DATA:  I have reviewed the data as listed Recent Results (from the past 2160 hour(s))  Basic metabolic panel     Status: Abnormal   Collection Time: 05/06/21  6:53 PM  Result Value Ref Range   Sodium 138 135 - 145 mmol/L   Potassium 3.4 (L) 3.5 - 5.1 mmol/L   Chloride 105 98 - 111 mmol/L   CO2 25 22 - 32 mmol/L   Glucose, Bld 95 70 - 99 mg/dL    Comment: Glucose reference range applies only to samples taken after fasting for at least 8 hours.   BUN 11 6 - 20 mg/dL   Creatinine, Ser 9.56 0.44 - 1.00 mg/dL   Calcium 8.9 8.9 - 21.3 mg/dL   GFR, Estimated >08 >65 mL/min    Comment: (NOTE) Calculated using the CKD-EPI Creatinine Equation (2021)    Anion gap 8 5 - 15    Comment: Performed at [facility], [address] [place], [place] [address]  CBC     Status: None   Collection Time: 05/06/21  6:53 PM  Result Value Ref Range   WBC 5.0 4.0 - 10.5 K/uL   RBC 4.20 3.87 - 5.11 MIL/uL   Hemoglobin 12.8 12.0 - 15.0 g/dL   HCT 62.9 52.8 - 41.3 %   MCV 90.5 80.0 - 100.0 fL   MCH 30.5 26.0 - 34.0 pg   MCHC 33.7 30.0 - 36.0 g/dL   RDW 24.4 01.0 - 27.2 %   Platelets 242 150 - 400 K/uL   nRBC 0.0 0.0 - 0.2 %    Comment: Performed at [facility], [address], [place], [place] [address]  Troponin I (High Sensitivity)     Status: None   Collection Time: 05/06/21  6:53 PM  Result Value Ref Range   Troponin I (High Sensitivity) 3 <18 ng/L    Comment: (NOTE) Elevated high sensitivity troponin  I (hsTnI) values and significant  changes across serial measurements may suggest ACS but many other  chronic and acute conditions are known to elevate hsTnI results.  Refer to the "Links" section for chest pain algorithms and additional  guidance. Performed at [facility], [address] [place], [place] [address]   Troponin I (High Sensitivity)      Status: None   Collection Time: 05/06/21  9:25 PM  Result Value Ref Range   Troponin I (High Sensitivity) 3 <18 ng/L    Comment: (NOTE) Elevated high sensitivity troponin I (hsTnI) values and significant  changes across serial measurements may suggest ACS but many other  chronic and acute conditions are known to elevate hsTnI results.  Refer to the "Links" section for chest pain algorithms and additional  guidance. Performed at [facility], [address], [place], [place] [address]   Magnesium     Status: None   Collection Time: 05/06/21  9:25 PM  Result Value Ref Range   Magnesium 2.0 1.7 - 2.4 mg/dL    Comment: Performed at [facility], [address], [place], [place] [address]    RADIOGRAPHIC STUDIES: I have personally reviewed the radiological reports and agreed with the findings in the report. DG Chest 2 View  Result Date: 05/06/2021 CLINICAL DATA:  Palpitations. EXAM: CHEST - 2 VIEW COMPARISON:  None. FINDINGS: The heart size and mediastinal contours are within normal limits. Both lungs are clear. The visualized skeletal structures are unremarkable. IMPRESSION: No active cardiopulmonary disease. Electronically Signed   By: Aram Candela M.D.   On: 05/06/2021 18:56     ASSESSMENT:  Stage I (T1CN0) right breast IDC, ER 3+, PR 3+, HER2 negative: - 08/03/2017: Right breast UIQ biopsy: Invasive lobular carcinoma, ER 100%, PR 20%, Ki-67 20%, HER2 negative. - Lumpectomy was done in [place].  1.7 cm PT1CN0 infiltrating ductal carcinoma ER 3+, PR 3+, HER2 negative. - Status post XRT in [place]. - Tamoxifen started in December 2019.  LMP was in October 2019.   Social/family history: - She lives at home with her husband.  She sits with elderly people, works for [employer]. - Maternal grandmother had stomach cancer.  Maternal uncle had lung cancer.   PLAN:  Stage I (T1CN0) right breast IDC, ER 3+, PR 3+, HER2 negative: - She is tolerating tamoxifen reasonably  well.  She had reportedly hair loss after radiation which has never come back. - She had endometrial polypectomy on 11/11/2020, benign endometrial type polyp.  She is following up with Dr. Charlotta Newton. - Last Pap smear was negative in May 2022. - Mammogram was on 09/18/2020, BI-RADS Category 2. - She reports that she is drinking 3 beers daily.  I have told her to stop/cut back to maximum of 1 beer per day.  I have also counseled her to exercise on a regular basis. - We will schedule her for mammogram in July.  We will see her back after the mammogram. - She will complete tamoxifen in December 2024.  We will consider BCI testing to see if she benefits from extended AI therapy.  We will also recommend geneticist evaluation.  2.  Right kidney lesion: - CTAP with contrast on 10/31/2020 showed 3.7 cm hemorrhagic cyst in the upper pole of the right kidney.  She reported she had right partial nephrectomy in 2007. - We will order renal ultrasound prior to next visit.  3.  Bone health: - Would obtain baseline DEXA scan.  We will also check vitamin  D levels.    Doreatha Massed, MD 05/28/21 9:12 AM  [facility] [phone]   I, Alda Ponder, am acting as a scribe for Dr. Doreatha Massed.  I, Doreatha Massed MD, have reviewed the above documentation for accuracy and completeness, and I agree with the above.

## 2021-07-16 ENCOUNTER — Ambulatory Visit (INDEPENDENT_AMBULATORY_CARE_PROVIDER_SITE_OTHER): Payer: BC Managed Care – PPO | Admitting: Obstetrics & Gynecology

## 2021-07-16 ENCOUNTER — Encounter: Payer: Self-pay | Admitting: Obstetrics & Gynecology

## 2021-07-16 VITALS — BP 122/77 | HR 87 | Ht 65.0 in | Wt 197.8 lb

## 2021-07-16 DIAGNOSIS — L304 Erythema intertrigo: Secondary | ICD-10-CM

## 2021-07-16 DIAGNOSIS — R3915 Urgency of urination: Secondary | ICD-10-CM

## 2021-07-16 MED ORDER — CLOTRIMAZOLE-BETAMETHASONE 1-0.05 % EX CREA: TOPICAL_CREAM | CUTANEOUS | 0 refills | Status: DC

## 2021-07-16 NOTE — Progress Notes (Signed)
? ?  GYN VISIT ?Patient name: Jennifer Harvey MRN [id number]  Date of birth: February 03, 1961 ?Chief Complaint:   ?Urinary issues ? ?History of Present Illness:   ?Jennifer Harvey is a 61 y.o. PM female being seen today for the following concerns: ? ?PCP follow up- Pt states there is something wrong with her urine.  Lab work reviewed- mixed flora- advised repeat specimen.  Today she denies dysuria.  On occasion notes incomplete voiding.  No burning, no itching.  +urinary frequency.  Up twice during the night to void.  Denies stress incontinence.  Reports urge incontinence and urinary urgency. ? ?Her biggest concern is the rash under her breast, which has been there for about 3 weeks. Notes itching and irritation under her breast- this has been going on for 3 weeks.  She has not used any OTC treatment.  Symptoms worse in the heat. ? ?Patient's last menstrual period was 04/29/2020 (within weeks). ? ? ?  12/27/2019  ? 11:22 AM 08/23/2017  ?  8:45 AM  ?Depression screen PHQ 2/9  ?Decreased Interest 0 0  ?Down, Depressed, Hopeless 0 0  ?PHQ - 2 Score 0 0  ?Altered sleeping 1   ?Tired, decreased energy 1   ?Change in appetite 1   ?Feeling bad or failure about yourself  0   ?Trouble concentrating 1   ?Moving slowly or fidgety/restless 0   ?Suicidal thoughts 0   ?PHQ-9 Score 4   ?Difficult doing work/chores Somewhat difficult   ? ? ? ?Review of Systems:   ?Pertinent items are noted in HPI ?Denies fever/chills, dizziness, headaches, visual disturbances, fatigue, shortness of breath, chest pain, abdominal pain, vomiting. ?Pertinent History Reviewed:  ?Reviewed past medical,surgical, social, obstetrical and family history.  ?Reviewed problem list, medications and allergies. ?Physical Assessment:  ? ?Vitals:  ? 07/16/21 1535  ?BP: 122/77  ?Pulse: 87  ?Weight: 197 lb 12.8 oz (89.7 kg)  ?Height: '5\' 5"'$  (1.651 m)  ?Body mass index is 32.92 kg/m?. ? ?     Physical Examination:  ? General appearance: alert, well appearing, and in no  distress ? Psych: mood appropriate, normal affect ? Breast: under bilateral breast- slightly raised hyperpigmented patches ? Cardiovascular: normal heart rate noted ? Respiratory: normal respiratory effort, no distress ? Abdomen: soft, non-tender  ? Pelvic: examination not indicated ? Extremities: no edema  ? ?Chaperone: N/A   ? ?Assessment & Plan:  ?1) Intertrigo ?-reviewed conservative options including keeping the area clean and dry ?-Rx sent in to use twice weekly then as needed ? ?2) Overactive bladder/urinary urgency ?-reviewed lab results- reassured pt that no acute abnormalities noted ?-plan to recheck UA and culture ?-discussed diagnosis of OAB- reviewed medical management including anti-cholinergics vs myrbetriq ?-pt notes symptoms are not that bothersome, she just wanted to make sure everything was ok ? ?-f/u prn or 80yrfor annual ? ?Meds ordered this encounter  ?Medications  ? clotrimazole-betamethasone (LOTRISONE) cream  ?  Sig: Apply small amount to affected area twice daily as needed  ?  Dispense:  45 g  ?  Refill:  0  ? ? ? ? ?JJanyth Pupa DO ?[facility]ending [facility] ?[facility]? ? ? ?

## 2021-07-17 LAB — URINALYSIS, ROUTINE W REFLEX MICROSCOPIC
Bilirubin, UA: NEGATIVE
Glucose, UA: NEGATIVE
Ketones, UA: NEGATIVE
Leukocytes,UA: NEGATIVE
Nitrite, UA: NEGATIVE
RBC, UA: NEGATIVE
Specific Gravity, UA: 1.018 (ref 1.005–1.030)
Urobilinogen, Ur: 0.2 mg/dL (ref 0.2–1.0)
pH, UA: 5 (ref 5.0–7.5)

## 2021-07-17 LAB — SPECIMEN STATUS REPORT

## 2021-07-19 LAB — URINE CULTURE

## 2021-07-19 LAB — SPECIMEN STATUS REPORT

## 2021-07-22 NOTE — Progress Notes (Deleted)
REFERRING PROVIDER: Derek Jack, MD [address] [place],  [place] [address]  PRIMARY PROVIDER:  Ocie Bob, FNP  PRIMARY REASON FOR VISIT:  1. Malignant neoplasm of upper-inner quadrant of right breast in female, estrogen receptor positive ([place])   2. History of kidney cancer   3. Family history of kidney cancer   4. Family history of stomach cancer    I connected with Jennifer Harvey on 07/23/2021 at 9:00 AM EDT by MyChart video conference and verified that I am speaking with the correct person using two identifiers.    Patient location: [place] Provider location: [place]:   [name], a 61 y.o. female, was seen for a [place] cancer genetics consultation at the request of Dr. Delton Coombes due to a personal and family history of cancer.  Jennifer Harvey presents to clinic today to discuss the possibility of a hereditary predisposition to cancer, genetic testing, and to further clarify her future cancer risks, as well as potential cancer risks for family members.   In 2007, at the age of 75, Jennifer Harvey was diagnosed with kidney cancer and had right partial nephrectomy.  In 2019, at the age of 46, Jennifer Harvey was diagnosed with ILC of the right breast, ER/PR+ HER2-. This was treated with lumpectomy, radiation and tamoxifen.   CANCER HISTORY:  Oncology History  Malignant neoplasm of upper-inner quadrant of breast in female, estrogen receptor positive (Jennifer Harvey)  06/07/2017 Mammogram   BILAT SCREENING MAMMOGRAM  There is an irregular focal asymmetry or spiculated mass in the upper inner right breast middle third, located 7-8 cm from the nipple with a single punctate calcification posteriorly.  Additionally there is a small irregular mass or focal asymmetry more anteriorly in the inner central right breast, 5 cm from the nipple.  Between these 2 masses there is a stable grouping of calcifications.  Left breast is negative     06/20/2017 Mammogram   Right unilateral diagnostic mammogram: Redemonstrated irregular masslike focal asymmetry with architectural distortion in the upper inner breast with an associated punctate calcification measuring 1.1 cm.  Anterior to this is a more subtle area of focal architectural distortion.  Overall the area spans approximately 4 cm    06/20/2017 Breast US   Targeted ultrasound of the right breast reveals at 2 o'clock position 3-4 cm from the nipple there is a subtle irregular hypoechoic area with shadowing measuring 8 mm.  Incidentally noted at 3 o'clock position 6 cm from the nipple is an oval well-circumscribed hypoechoic mass measuring 1.6 x 0.8 x 1.1 cm    06/30/2017 Initial Biopsy   [place] needle core biopsy:  Diagnosis: Fibrotic tissue with increased adenosis, no tumor seen. Immunostains returned.  All glandular elements in the core biopsies are positive for CK 5/6, high molecular weight cytokeratin and E-Cadherin.  There is no focal increase in proliferation with a Ki-67 stain.  Both p63 and smooth muscle myosin exhibit intact basilar and myoepithelial layers surrounding all glandular elements in the core biopsy sections.  These findings support a benign diagnosis.    07/12/2017 Mammogram   Diagnostic mammogram, post biopsy clip imaging: Biopsy clip is approximately 1.3 cm medial to the suspicious mass which was biopsied on [place]. There is a persistent asymmetry at the site of concern in the medial right breast middle depth. Further biopsy was recommended     08/03/2017 Pathology Results   Diagnosis 1. Breast, right, needle core biopsy, UIQ - INVASIVE  MAMMARY CARCINOMA. - MAMMARY CARCINOMA IN SITU. - SEE MICROSCOPIC DESCRIPTION. 2. Breast, right, needle core biopsy, UIQ - INVASIVE MAMMARY CARCINOMA. - MAMMARY CARCINOMA IN SITU. - SEE MICROSCOPIC DESCRIPTION.  Microscopic Comment 1. There is invasive mammary carcinoma which has features suggestive of invasive lobular  carcinoma. There is also a separate component which may represent an invasive ductal component. E-Cadherin immunohistochemistry will be performed as well as prognostic profile. 2. E-Cadherin and breast prognostic profile will be performed.  ADDENDUM: 1,2. Immunohistochemistry for E-Cadherin shows an invasive and in situ component that is E-Cadherin negative consistent with lobular carcinoma. There is also a separate morphologic component which is strongly E-Cadherin positive consistent with invasive and in situ ductal carcinoma. Basal cell markers for p63, calponin and smooth muscle myosin are negative in the invasive component supporting the diagnosis. (JDP:kh 08-04-17)  1. PROGNOSTIC INDICATORS Results: IMMUNOHISTOCHEMICAL AND MORPHOMETRIC ANALYSIS PERFORMED MANUALLY Estrogen Receptor: 100%, POSITIVE, STRONG STAINING INTENSITY Progesterone Receptor: 10%, POSITIVE, STRONG STAINING INTENSITY Proliferation Marker Ki67: 20%  1. FLUORESCENCE IN-SITU HYBRIDIZATION Results: HER2 - NEGATIVE RATIO OF HER2/CEP17 SIGNALS 1.41 AVERAGE HER2 COPY NUMBER PER CELL 1.90  2. PROGNOSTIC INDICATORS Results: IMMUNOHISTOCHEMICAL AND MORPHOMETRIC ANALYSIS PERFORMED MANUALLY Estrogen Receptor: 100%, POSITIVE, STRONG STAINING INTENSITY Progesterone Receptor: 20%, POSITIVE, STRONG STAINING INTENSITY Proliferation Marker Ki67: 20%  2. FLUORESCENCE IN-SITU HYBRIDIZATION Results: HER2 - NEGATIVE RATIO OF HER2/CEP17 SIGNALS 1.25 AVERAGE HER2 COPY NUMBER PER CELL 2.00    08/19/2017 Initial Diagnosis   Malignant neoplasm of upper-inner quadrant of breast in female, estrogen receptor positive (Jennifer Harvey)    08/19/2017 Cancer Staging   Staging form: Breast, AJCC 8th Edition - Clinical stage from 08/19/2017: cT1c, cN0, cM0, ER+, PR+, HER2- - Signed by Alla Feeling, NP on 08/19/2017       RISK FACTORS:  Menarche was at age 69.  First live birth at age 8.  OCP use for approximately 6 years.  Ovaries intact:  {Yes/No-Ex:120004}.  Hysterectomy: yes.  Menopausal status: postmenopausal.  HRT use: 0 years. Colonoscopy: {Yes/No-Ex:120004}; {normal/abnormal/not examined:14677}. Mammogram within the last year: yes. Number of breast biopsies: {Numbers 1-12 multi-select:20307}. Up to date with pelvic exams: {Yes/No-Ex:120004}. Any excessive radiation exposure in the past: {Yes/No-Ex:120004}  Past Medical History:  Diagnosis Date   Breast cancer ([place])    Cancer ([place])    renal cell carcinoma 2007 s/p partial right nephrectomy    History of kidney cancer    Hypertension    Personal history of chemotherapy    Personal history of radiation therapy    Sleep apnea     Past Surgical History:  Procedure Laterality Date   BREAST LUMPECTOMY Right 2019   BREAST SURGERY     CESAREAN SECTION  1994   HYSTEROSCOPY WITH D & C N/A 03/12/2020   Procedure: Hysteroscopy Uterine Curettage;  Surgeon: Florian Buff, MD;  Location: [facility];  Service: Gynecology;  Laterality: N/A;   HYSTEROSCOPY WITH D & C N/A 11/11/2020   Procedure: DILATATION AND CURETTAGE /HYSTEROSCOPY;  Surgeon: Janyth Pupa, DO;  Location: [facility];  Service: Gynecology;  Laterality: N/A;   POLYPECTOMY N/A 11/11/2020   Procedure: Myosure POLYPECTOMY;  Surgeon: Janyth Pupa, DO;  Location: [facility];  Service: Gynecology;  Laterality: N/A;   FAMILY HISTORY:  We obtained a detailed, 4-generation family history.  Significant diagnoses are listed below: Family History  Problem Relation Age of Onset   Cancer Maternal Grandmother        stomach   Cancer Maternal Grandfather  kidney cancer   Other Father        auto accident    Ms. Cragun is unaware of previous family history of genetic testing for hereditary cancer risks.  There is no reported Ashkenazi Jewish ancestry. There is no known consanguinity.  GENETIC COUNSELING ASSESSMENT: Ms. Samet is a 61 y.o. female with a personal history of breast cancer which is somewhat suggestive of a  hereditary cancer syndrome and predisposition to cancer. We, therefore, discussed and recommended the following at today's visit.   DISCUSSION: We discussed that approximately 10% of breast cancer is hereditary. Most cases of hereditary breast cancer are associated with BRCA1/BRCA2 genes, although there are other genes associated with hereditary cancer as well including genes associated with kidney and stomach cancer. Cancers and risks are gene specific. We discussed that testing is beneficial for several reasons including knowing about cancer risks, identifying potential screening and risk-reduction options that may be appropriate, and to understand if other family members could be at risk for cancer and allow them to undergo genetic testing.   We reviewed the characteristics, features and inheritance patterns of hereditary cancer syndromes. We also discussed genetic testing, including the appropriate family members to test, the process of testing, insurance coverage and turn-around-time for results. We discussed the implications of a negative, positive and/or variant of uncertain significant result. We recommended Ms. Koffman pursue genetic testing for the [place] *** gene panel.   Based on Ms. Zeitler's personal and family history of cancer, she meets medical criteria for genetic testing. Despite that she meets criteria, she may still have an out of pocket cost. We discussed that if her out of pocket cost for testing is over $100, the laboratory will call and confirm whether she wants to proceed with testing.  If the out of pocket cost of testing is less than $100 she will be billed by the genetic testing laboratory.   PLAN: After considering the risks, benefits, and limitations, Ms. Marzano provided informed consent to pursue genetic testing and the blood sample was sent to [facility] for analysis of the {test}. Results should be available within approximately 2-3 weeks' time, at which point they  will be disclosed by telephone to Ms. Cheaney, as will any additional recommendations warranted by these results. Ms. Branscum will receive a summary of her genetic counseling visit and a copy of her results once available. This information will also be available in Epic.   *** Despite our recommendation, Ms. Kneip did not wish to pursue genetic testing at today's visit. We understand this decision and remain available to coordinate genetic testing at any time in the future. We, therefore, recommend Ms. Gros continue to follow the cancer screening guidelines given by her primary healthcare provider.  Ms. Flanery questions were answered to her satisfaction today. Our contact information was provided should additional questions or concerns arise. Thank you for the referral and allowing [place] to share in the care of your patient.   Faith Rogue, MS, [facility] Genetic Counselor [email or web address] Phone: [phone]  The patient was seen for a total of *** minutes in face-to-face genetic counseling.  Dr. Grayland Ormond was available for discussion regarding this case.   _______________________________________________________________________ For Office Staff:  Number of people involved in session: *** Was an Intern/ student involved with case: no

## 2021-07-23 ENCOUNTER — Telehealth (HOSPITAL_COMMUNITY): Payer: Medicare PPO | Admitting: Licensed Clinical Social Worker

## 2021-07-23 DIAGNOSIS — Z8051 Family history of malignant neoplasm of kidney: Secondary | ICD-10-CM

## 2021-07-23 DIAGNOSIS — Z800 Family history of malignant neoplasm of digestive organs: Secondary | ICD-10-CM

## 2021-07-23 DIAGNOSIS — Z170 Estrogen receptor positive status [ER+]: Secondary | ICD-10-CM

## 2021-07-23 DIAGNOSIS — Z85528 Personal history of other malignant neoplasm of kidney: Secondary | ICD-10-CM

## 2021-08-04 ENCOUNTER — Other Ambulatory Visit: Payer: Medicare PPO | Admitting: Obstetrics & Gynecology

## 2021-08-06 ENCOUNTER — Other Ambulatory Visit: Payer: Medicare PPO | Admitting: Obstetrics & Gynecology

## 2021-08-17 ENCOUNTER — Ambulatory Visit: Payer: Medicare PPO

## 2021-08-17 ENCOUNTER — Encounter: Payer: Self-pay | Admitting: Cardiology

## 2021-08-17 ENCOUNTER — Other Ambulatory Visit: Payer: Self-pay | Admitting: Cardiology

## 2021-08-17 ENCOUNTER — Ambulatory Visit (INDEPENDENT_AMBULATORY_CARE_PROVIDER_SITE_OTHER): Payer: BC Managed Care – PPO | Admitting: Cardiology

## 2021-08-17 DIAGNOSIS — R002 Palpitations: Secondary | ICD-10-CM

## 2021-08-17 DIAGNOSIS — I10 Essential (primary) hypertension: Secondary | ICD-10-CM

## 2021-08-17 NOTE — Assessment & Plan Note (Signed)
Currently on benazepril hydrochlorothiazide as well as amlodipine 5 mg once a day.  Overall well controlled usually in the 120s.  Today slightly elevated.  Continue to monitor.

## 2021-08-17 NOTE — Assessment & Plan Note (Signed)
Occasional palpitations noted.  Sometimes occurs with exercise or walking.  I will place a ZIO monitor on her to make sure that there are no adverse arrhythmias present.  I do not hear any murmurs.  No high risk symptoms such as syncope.  Continue with conservative management.  Knows to avoid excessive alcohol intake.

## 2021-08-17 NOTE — Patient Instructions (Signed)
Medication Instructions:  Your physician recommends that you continue on your current medications as directed. Please refer to the Current Medication list given to you today.   Labwork: None  Testing/Procedures: None  Follow-Up: Follow up with Dr. Marlou Porch: AS NEEDED  Any Other Special Instructions Will Be Listed Below (If Applicable).     If you need a refill on your cardiac medications before your next appointment, please call your pharmacy.   ZIO XT- Long Term Monitor Instructions   Your physician has requested you wear your ZIO patch monitor__14___days.   This is a single patch monitor.  [employer] supplies one patch monitor per enrollment.  Additional stickers are not available.   Please do not apply patch if you will be having a Nuclear Stress Test, Echocardiogram, Cardiac CT, MRI, or Chest Xray during the time frame you would be wearing the monitor. The patch cannot be worn during these tests.  You cannot remove and re-apply the ZIO XT patch monitor.   Your ZIO patch monitor will be sent USPS Priority mail from [facility] directly to your home address. The monitor may also be mailed to a PO BOX if home delivery is not available.   It may take 3-5 days to receive your monitor after you have been enrolled.   Once you have received you monitor, please review enclosed instructions.  Your monitor has already been registered assigning a specific monitor serial # to you.   Applying the monitor   Shave hair from upper left chest.   Hold abrader disc by orange tab.  Rub abrader in 40 strokes over left upper chest as indicated in your monitor instructions.   Clean area with 4 enclosed alcohol pads .  Use all pads to assure are is cleaned thoroughly.  Let dry.   Apply patch as indicated in monitor instructions.  Patch will be place under collarbone on left side of chest with arrow pointing upward.   Rub patch adhesive wings for 2 minutes.Remove white label marked "1".   Remove white label marked "2".  Rub patch adhesive wings for 2 additional minutes.   While looking in a mirror, press and release button in center of patch.  A small green light will flash 3-4 times .  This will be your only indicator the monitor has been turned on.     Do not shower for the first 24 hours.  You may shower after the first 24 hours.   Press button if you feel a symptom. You will hear a small click.  Record Date, Time and Symptom in the Patient Log Book.   When you are ready to remove patch, follow instructions on last 2 pages of Patient Log Book.  Stick patch monitor onto last page of Patient Log Book.   Place Patient Log Book in [place] box.  Use locking tab on box and tape box closed securely.  The [employer] and [employer] has [employer] on it.  Please place in mailbox as soon as possible.  Your physician should have your test results approximately 7 days after the monitor has been mailed back to [facility].   Call [place] at [phone] if you have questions regarding your ZIO XT patch monitor.  Call them immediately if you see an orange light blinking on your monitor.   If your monitor falls off in less than 4 days contact our Monitor department at [phone].  If your monitor becomes loose or falls off after 4 days call  [employer] at [phone] for suggestions on securing your monitor.

## 2021-08-17 NOTE — Progress Notes (Signed)
Cardiology Office Note:    Date:  08/17/2021   ID:  Jennifer Harvey, DOB 17-Jun-1960, MRN [id number]  PCP:  Ocie Bob, FNP   [facility] Providers Cardiologist:  None     Referring MD: Merrily Pew, MD    History of Present Illness:    Jennifer Harvey is a 61 y.o. female here for the evaluation of palpitations at the request of Elisabeth Pigeon, NP and Merrily Pew, MD.  She was recently in the emergency department on 05/06/2021 with palpitations that normally lasted 1 to 2 seconds at a time.  Felt some slight chest pressure at those moments.  She had felt like this was new to her.  Will have occasional Bud Light with her friends on the weekend.  Also marijuana.  Caffeine.  Feels it with walking. Deep breath helps. No CP. No syncope. Easy bruising. Always been like this.   Has sleep apnea. Awaiting CPAP.   Prior renal cell carcinoma in 2007 with partial right nephrectomy.  EKG from prior ER visit personally reviewed and interpreted shows sinus rhythm with nonspecific ST-T wave changes.  Her potassium at that time was slightly low at 3.4.  Troponins were normal.  Hemoglobin 12.8.  Past Medical History:  Diagnosis Date   Breast cancer ([place])    Cancer ([place])    renal cell carcinoma 2007 s/p partial right nephrectomy    History of kidney cancer    Hypertension    Personal history of chemotherapy    Personal history of radiation therapy    Sleep apnea     Past Surgical History:  Procedure Laterality Date   BREAST LUMPECTOMY Right 2019   BREAST SURGERY     CESAREAN SECTION  1994   HYSTEROSCOPY WITH D & C N/A 03/12/2020   Procedure: Hysteroscopy Uterine Curettage;  Surgeon: Florian Buff, MD;  Location: [facility];  Service: Gynecology;  Laterality: N/A;   HYSTEROSCOPY WITH D & C N/A 11/11/2020   Procedure: DILATATION AND CURETTAGE /HYSTEROSCOPY;  Surgeon: Janyth Pupa, DO;  Location: [facility];  Service: Gynecology;  Laterality: N/A;   POLYPECTOMY N/A 11/11/2020    Procedure: Myosure POLYPECTOMY;  Surgeon: Janyth Pupa, DO;  Location: [facility];  Service: Gynecology;  Laterality: N/A;    Current Medications: Current Meds  Medication Sig   amLODipine (NORVASC) 5 MG tablet Take 5 mg by mouth daily.    benazepril-hydrochlorthiazide (LOTENSIN HCT) 5-6.25 MG tablet Take 1 tablet by mouth daily.   citalopram (CELEXA) 40 MG tablet Take 40 mg by mouth daily.   clotrimazole-betamethasone (LOTRISONE) cream Apply small amount to affected area twice daily as needed   dicyclomine (BENTYL) 20 MG tablet Take 1 tablet (20 mg total) by mouth 2 (two) times daily.   gabapentin (NEURONTIN) 100 MG capsule Take 100 mg by mouth in the morning and at bedtime.   Multiple Vitamin (MULTIVITAMIN) tablet Take 1 tablet by mouth daily.   omeprazole (PRILOSEC) 40 MG capsule Take 40 mg by mouth daily.   ondansetron (ZOFRAN) 4 MG tablet Take 1 tablet (4 mg total) by mouth every 6 (six) hours.   tamoxifen (NOLVADEX) 20 MG tablet Take 20 mg by mouth daily.     Allergies:   Patient has no known allergies.   Social History   Socioeconomic History   Marital status: Married    Spouse name: Not on file   Number of children: 4   Years of education: Not on file   Highest education level: Not on  file  Occupational History   Occupation: makes cookies    Employer: [employer]  Tobacco Use   Smoking status: Never   Smokeless tobacco: Never  Vaping Use   Vaping Use: Never used  Substance and Sexual Activity   Alcohol use: Yes    Alcohol/week: 4.0 standard drinks    Types: 4 Cans of beer per week    Comment: 4 beers per day   Drug use: Yes    Types: Marijuana   Sexual activity: Not Currently    Birth control/protection: Post-menopausal  Other Topics Concern   Not on file  Social History Narrative   Not on file   Social Determinants of Health   Financial Resource Strain: Not on file  Food Insecurity: Not on file  Transportation Needs: Not on file  Physical Activity: Not on file   Stress: Not on file  Social Connections: Not on file     Family History: The patient's family history includes Cancer in her maternal grandfather and maternal grandmother; Other in her father.  ROS:   Please see the history of present illness.    No fevers chills nausea vomiting syncope bleeding all other systems reviewed and are negative.  EKGs/Labs/Other Studies Reviewed:    The following studies were reviewed today: ER visit reviewed.  Prior EKG reviewed as above.  EKG: Prior EKG reviewed as above.  Recent Labs: 10/31/2020: ALT 25 05/06/2021: BUN 11; Creatinine, Ser 0.62; Hemoglobin 12.8; Magnesium 2.0; Platelets 242; Potassium 3.4; Sodium 138  Recent Lipid Panel No results found for: CHOL, TRIG, HDL, CHOLHDL, VLDL, LDLCALC, LDLDIRECT   Risk Assessment/Calculations:              Physical Exam:    VS:  BP (!) 142/90   Pulse 84   Ht '5\' 5"'$  (1.651 m)   Wt 194 lb 12.8 oz (88.4 kg)   LMP 04/29/2020 (Within Weeks) Comment: Bleeding  SpO2 95%   BMI 32.42 kg/m     Wt Readings from Last 3 Encounters:  08/17/21 194 lb 12.8 oz (88.4 kg)  07/16/21 197 lb 12.8 oz (89.7 kg)  05/28/21 200 lb 1.6 oz (90.8 kg)     GEN:  Well nourished, well developed in no acute distress HEENT: Normal NECK: No JVD; No carotid bruits LYMPHATICS: No lymphadenopathy CARDIAC: RRR, no murmurs, no rubs, gallops RESPIRATORY:  Clear to auscultation without rales, wheezing or rhonchi  ABDOMEN: Soft, non-tender, non-distended MUSCULOSKELETAL:  No edema; No deformity  SKIN: Warm and dry NEUROLOGIC:  Alert and oriented x 3 PSYCHIATRIC:  Normal affect   ASSESSMENT:    1. Palpitations   2. Primary hypertension    PLAN:    In order of problems listed above:  Palpitations Occasional palpitations noted.  Sometimes occurs with exercise or walking.  I will place a ZIO monitor on her to make sure that there are no adverse arrhythmias present.  I do not hear any murmurs.  No high risk symptoms such  as syncope.  Continue with conservative management.  Knows to avoid excessive alcohol intake.  Primary hypertension Currently on benazepril hydrochlorothiazide as well as amlodipine 5 mg once a day.  Overall well controlled usually in the 120s.  Today slightly elevated.  Continue to monitor.         Medication Adjustments/Labs and Tests Ordered: Current medicines are reviewed at length with the patient today.  Concerns regarding medicines are outlined above.  No orders of the defined types were placed in this encounter.  No orders  of the defined types were placed in this encounter.   Patient Instructions  Medication Instructions:  Your physician recommends that you continue on your current medications as directed. Please refer to the Current Medication list given to you today.   Labwork: None  Testing/Procedures: None  Follow-Up: Follow up with Dr. Marlou Porch: AS NEEDED  Any Other Special Instructions Will Be Listed Below (If Applicable).     If you need a refill on your cardiac medications before your next appointment, please call your pharmacy.   ZIO XT- Long Term Monitor Instructions   Your physician has requested you wear your ZIO patch monitor__14___days.   This is a single patch monitor.  [employer] supplies one patch monitor per enrollment.  Additional stickers are not available.   Please do not apply patch if you will be having a Nuclear Stress Test, Echocardiogram, Cardiac CT, MRI, or Chest Xray during the time frame you would be wearing the monitor. The patch cannot be worn during these tests.  You cannot remove and re-apply the ZIO XT patch monitor.   Your ZIO patch monitor will be sent USPS Priority mail from [facility] directly to your home address. The monitor may also be mailed to a PO BOX if home delivery is not available.   It may take 3-5 days to receive your monitor after you have been enrolled.   Once you have received you monitor, please review  enclosed instructions.  Your monitor has already been registered assigning a specific monitor serial # to you.   Applying the monitor   Shave hair from upper left chest.   Hold abrader disc by orange tab.  Rub abrader in 40 strokes over left upper chest as indicated in your monitor instructions.   Clean area with 4 enclosed alcohol pads .  Use all pads to assure are is cleaned thoroughly.  Let dry.   Apply patch as indicated in monitor instructions.  Patch will be place under collarbone on left side of chest with arrow pointing upward.   Rub patch adhesive wings for 2 minutes.Remove white label marked "1".  Remove white label marked "2".  Rub patch adhesive wings for 2 additional minutes.   While looking in a mirror, press and release button in center of patch.  A small green light will flash 3-4 times .  This will be your only indicator the monitor has been turned on.     Do not shower for the first 24 hours.  You may shower after the first 24 hours.   Press button if you feel a symptom. You will hear a small click.  Record Date, Time and Symptom in the Patient Log Book.   When you are ready to remove patch, follow instructions on last 2 pages of Patient Log Book.  Stick patch monitor onto last page of Patient Log Book.   Place Patient Log Book in [place] box.  Use locking tab on box and tape box closed securely.  The [employer] has [employer] on it.  Please place in mailbox as soon as possible.  Your physician should have your test results approximately 7 days after the monitor has been mailed back to [facility].   Call [place] at [phone] if you have questions regarding your ZIO XT patch monitor.  Call them immediately if you see an orange light blinking on your monitor.   If your monitor falls off in less than 4 days contact our Monitor department at  [phone].  If your monitor becomes loose or falls off after 4 days call [employer] at  [phone] for suggestions on securing your monitor.     Signed, Candee Furbish, MD  08/17/2021 2:29 PM    [place]

## 2021-08-18 ENCOUNTER — Telehealth: Payer: Self-pay | Admitting: Cardiology

## 2021-08-18 NOTE — Telephone Encounter (Signed)
Advised pt to give [employer] a call at the 1-800 number that was provided on discharge AVS and they will ship out a new monitor. Pt verbalized agreement.

## 2021-08-18 NOTE — Telephone Encounter (Signed)
Patient states she had a heart monitor put on yesterday but it came off because her skin is so oily from hot flashes.

## 2021-08-19 DIAGNOSIS — G4733 Obstructive sleep apnea (adult) (pediatric): Secondary | ICD-10-CM | POA: Diagnosis not present

## 2021-09-18 DIAGNOSIS — G4733 Obstructive sleep apnea (adult) (pediatric): Secondary | ICD-10-CM | POA: Diagnosis not present

## 2021-10-05 ENCOUNTER — Inpatient Hospital Stay (HOSPITAL_COMMUNITY): Payer: BC Managed Care – PPO | Attending: Hematology

## 2021-10-05 ENCOUNTER — Ambulatory Visit (HOSPITAL_COMMUNITY): Payer: BC Managed Care – PPO

## 2021-10-05 ENCOUNTER — Ambulatory Visit (HOSPITAL_COMMUNITY): Payer: Medicare PPO

## 2021-10-05 ENCOUNTER — Other Ambulatory Visit (HOSPITAL_COMMUNITY): Payer: Medicare PPO

## 2021-10-15 ENCOUNTER — Ambulatory Visit (HOSPITAL_COMMUNITY): Payer: Medicare PPO | Admitting: Hematology

## 2021-10-19 DIAGNOSIS — G4733 Obstructive sleep apnea (adult) (pediatric): Secondary | ICD-10-CM | POA: Diagnosis not present

## 2021-10-22 ENCOUNTER — Telehealth (HOSPITAL_COMMUNITY): Payer: Medicare PPO | Admitting: Licensed Clinical Social Worker

## 2021-10-23 ENCOUNTER — Other Ambulatory Visit (HOSPITAL_COMMUNITY): Payer: Self-pay | Admitting: Hematology

## 2021-11-19 DIAGNOSIS — G4733 Obstructive sleep apnea (adult) (pediatric): Secondary | ICD-10-CM | POA: Diagnosis not present

## 2021-12-09 ENCOUNTER — Ambulatory Visit: Payer: BC Managed Care – PPO | Admitting: Obstetrics & Gynecology

## 2021-12-19 DIAGNOSIS — G4733 Obstructive sleep apnea (adult) (pediatric): Secondary | ICD-10-CM | POA: Diagnosis not present

## 2022-01-13 ENCOUNTER — Ambulatory Visit: Payer: BC Managed Care – PPO | Admitting: Obstetrics and Gynecology

## 2022-01-14 ENCOUNTER — Ambulatory Visit: Payer: BC Managed Care – PPO | Admitting: Adult Health

## 2022-01-19 DIAGNOSIS — G4733 Obstructive sleep apnea (adult) (pediatric): Secondary | ICD-10-CM | POA: Diagnosis not present

## 2022-01-20 ENCOUNTER — Other Ambulatory Visit: Payer: Self-pay | Admitting: *Deleted

## 2022-01-20 MED ORDER — TAMOXIFEN CITRATE 20 MG PO TABS: 20.0000 mg | ORAL_TABLET | Freq: Every day | ORAL | 0 refills | Status: DC

## 2022-01-20 NOTE — Telephone Encounter (Signed)
Patient was ncns for Dexa scan and mammo and follow up cancelled for Dr. Delton Coombes.  Message left for patient to make sure she completes these 2 studies and call back to make follow up appt with Dr. Delton Coombes.  Will give one month of Tamoxifen at this time.

## 2022-02-03 ENCOUNTER — Inpatient Hospital Stay: Payer: BC Managed Care – PPO | Attending: Hematology

## 2022-02-03 ENCOUNTER — Ambulatory Visit (HOSPITAL_COMMUNITY)
Admission: RE | Admit: 2022-02-03 | Discharge: 2022-02-03 | Disposition: A | Discharge status: Home / Self Care | Payer: BC Managed Care – PPO | Source: Ambulatory Visit | Attending: Hematology | Admitting: Hematology

## 2022-02-03 ENCOUNTER — Other Ambulatory Visit: Payer: Self-pay

## 2022-02-03 DIAGNOSIS — Z905 Acquired absence of kidney: Secondary | ICD-10-CM | POA: Insufficient documentation

## 2022-02-03 DIAGNOSIS — Z85528 Personal history of other malignant neoplasm of kidney: Secondary | ICD-10-CM | POA: Diagnosis not present

## 2022-02-03 DIAGNOSIS — Z170 Estrogen receptor positive status [ER+]: Secondary | ICD-10-CM | POA: Insufficient documentation

## 2022-02-03 DIAGNOSIS — Z79810 Long term (current) use of selective estrogen receptor modulators (SERMs): Secondary | ICD-10-CM | POA: Diagnosis not present

## 2022-02-03 DIAGNOSIS — Z13820 Encounter for screening for osteoporosis: Secondary | ICD-10-CM | POA: Diagnosis not present

## 2022-02-03 DIAGNOSIS — Z79899 Other long term (current) drug therapy: Secondary | ICD-10-CM | POA: Insufficient documentation

## 2022-02-03 DIAGNOSIS — N951 Menopausal and female climacteric states: Secondary | ICD-10-CM | POA: Insufficient documentation

## 2022-02-03 DIAGNOSIS — Z780 Asymptomatic menopausal state: Secondary | ICD-10-CM | POA: Insufficient documentation

## 2022-02-03 DIAGNOSIS — Z923 Personal history of irradiation: Secondary | ICD-10-CM | POA: Diagnosis not present

## 2022-02-03 DIAGNOSIS — Z1231 Encounter for screening mammogram for malignant neoplasm of breast: Secondary | ICD-10-CM | POA: Diagnosis not present

## 2022-02-03 DIAGNOSIS — C50211 Malignant neoplasm of upper-inner quadrant of right female breast: Secondary | ICD-10-CM | POA: Diagnosis not present

## 2022-02-03 LAB — CBC WITH DIFFERENTIAL/PLATELET
Abs Immature Granulocytes: 0 10*3/uL (ref 0.00–0.07)
Basophils Absolute: 0 10*3/uL (ref 0.0–0.1)
Basophils Relative: 1 %
Eosinophils Absolute: 0.1 10*3/uL (ref 0.0–0.5)
Eosinophils Relative: 2 %
HCT: 36.8 % (ref 36.0–46.0)
Hemoglobin: 12.5 g/dL (ref 12.0–15.0)
Immature Granulocytes: 0 %
Lymphocytes Relative: 44 %
Lymphs Abs: 1.8 10*3/uL (ref 0.7–4.0)
MCH: 29.8 pg (ref 26.0–34.0)
MCHC: 34 g/dL (ref 30.0–36.0)
MCV: 87.6 fL (ref 80.0–100.0)
Monocytes Absolute: 0.4 10*3/uL (ref 0.1–1.0)
Monocytes Relative: 11 %
Neutro Abs: 1.8 10*3/uL (ref 1.7–7.7)
Neutrophils Relative %: 42 %
Platelets: 218 10*3/uL (ref 150–400)
RBC: 4.2 MIL/uL (ref 3.87–5.11)
RDW: 14 % (ref 11.5–15.5)
WBC: 4.2 10*3/uL (ref 4.0–10.5)
nRBC: 0 % (ref 0.0–0.2)

## 2022-02-03 LAB — COMPREHENSIVE METABOLIC PANEL
ALT: 26 U/L (ref 0–44)
AST: 24 U/L (ref 15–41)
Albumin: 3.9 g/dL (ref 3.5–5.0)
Alkaline Phosphatase: 92 U/L (ref 38–126)
Anion gap: 6 (ref 5–15)
BUN: 13 mg/dL (ref 6–20)
CO2: 23 mmol/L (ref 22–32)
Calcium: 8.5 mg/dL — ABNORMAL LOW (ref 8.9–10.3)
Chloride: 110 mmol/L (ref 98–111)
Creatinine, Ser: 0.67 mg/dL (ref 0.44–1.00)
GFR, Estimated: >60 mL/min (ref >60)
Glucose, Bld: 106 mg/dL — ABNORMAL HIGH (ref 70–99)
Potassium: 3.7 mmol/L (ref 3.5–5.1)
Sodium: 139 mmol/L (ref 135–145)
Total Bilirubin: 0.5 mg/dL (ref 0.3–1.2)
Total Protein: 6.9 g/dL (ref 6.5–8.1)

## 2022-02-03 LAB — VITAMIN D 25 HYDROXY (VIT D DEFICIENCY, FRACTURES): Vit D, 25-Hydroxy: 30.76 ng/mL (ref 30–100)

## 2022-02-10 ENCOUNTER — Inpatient Hospital Stay (HOSPITAL_BASED_OUTPATIENT_CLINIC_OR_DEPARTMENT_OTHER): Payer: BC Managed Care – PPO | Admitting: Hematology

## 2022-02-10 VITALS — BP 133/84 | HR 80 | Temp 98.4°F | Resp 17 | Ht 65.0 in | Wt 199.9 lb

## 2022-02-10 DIAGNOSIS — Z85528 Personal history of other malignant neoplasm of kidney: Secondary | ICD-10-CM | POA: Diagnosis not present

## 2022-02-10 DIAGNOSIS — N951 Menopausal and female climacteric states: Secondary | ICD-10-CM | POA: Diagnosis not present

## 2022-02-10 DIAGNOSIS — C50211 Malignant neoplasm of upper-inner quadrant of right female breast: Secondary | ICD-10-CM | POA: Diagnosis not present

## 2022-02-10 DIAGNOSIS — Z79899 Other long term (current) drug therapy: Secondary | ICD-10-CM | POA: Diagnosis not present

## 2022-02-10 DIAGNOSIS — Z170 Estrogen receptor positive status [ER+]: Secondary | ICD-10-CM

## 2022-02-10 DIAGNOSIS — Z905 Acquired absence of kidney: Secondary | ICD-10-CM | POA: Diagnosis not present

## 2022-02-10 DIAGNOSIS — Z923 Personal history of irradiation: Secondary | ICD-10-CM | POA: Diagnosis not present

## 2022-02-10 NOTE — Patient Instructions (Addendum)
[name]  Discharge Instructions  You were seen and examined today by Dr. Delton Coombes.  Dr. Delton Coombes discussed your most recent lab work, mammogram and bone density test which revealed that everything looks good.  Dr. Delton Coombes is going to get you scheduled for ultrasound of your kidney. Try [place] Sleep Cool for relief of hot flashes.   Follow-up as scheduled in 6 months with labs.    Thank you for choosing [facility] to provide your oncology and hematology care.   To afford each patient quality time with our provider, please arrive at least 15 minutes before your scheduled appointment time. You may need to reschedule your appointment if you arrive late (10 or more minutes). Arriving late affects you and other patients whose appointments are after yours.  Also, if you miss three or more appointments without notifying the office, you may be dismissed from the clinic at the provider's discretion.    Again, thank you for choosing [facility].  Our hope is that these requests will decrease the amount of time that you wait before being seen by our physicians.   If you have a lab appointment with the [facility] please come in thru the Main Entrance and check in at the main information desk.           _____________________________________________________________  Should you have questions after your visit to [facility], please contact our office at [phone] and follow the prompts.  Our office hours are 8:00 a.m. to 4:30 p.m. Monday - Thursday and 8:00 a.m. to 2:30 p.m. Friday.  Please note that voicemails left after 4:00 p.m. may not be returned until the following business day.  We are closed weekends and all major holidays.  You do have access to a nurse 24-7, just call the main number to the clinic [phone] and do not press any options, hold on the line and a nurse will answer the phone.     For prescription refill requests, have your pharmacy contact our office and allow 72 hours.    Masks are optional in the cancer centers. If you would like for your care team to wear a mask while they are taking care of you, please let them know. You may have one support person who is at least 59 years old accompany you for your appointments.

## 2022-02-10 NOTE — Progress Notes (Signed)
[place], [facility] [address]   CLINIC:  Medical Oncology/Hematology  PCP:  Ocie Bob, FNP [place] [address] [phone]   REASON FOR VISIT:  Follow-up for right breast cancer.  PRIOR THERAPY: 1. Right partial nephrectomy in 2007 for kidney cancer 2.  Right breast lumpectomy in June 2019 3.  Radiation therapy done in [place]  NGS Results: Not done  CURRENT THERAPY: Tamoxifen  BRIEF ONCOLOGIC HISTORY:  Oncology History  Malignant neoplasm of upper-inner quadrant of breast in female, estrogen receptor positive ([place])  06/07/2017 Mammogram   BILAT SCREENING MAMMOGRAM  There is an irregular focal asymmetry or spiculated mass in the upper inner right breast middle third, located 7-8 cm from the nipple with a single punctate calcification posteriorly.  Additionally there is a small irregular mass or focal asymmetry more anteriorly in the inner central right breast, 5 cm from the nipple.  Between these 2 masses there is a stable grouping of calcifications.  Left breast is negative   06/20/2017 Mammogram   Right unilateral diagnostic mammogram: Redemonstrated irregular masslike focal asymmetry with architectural distortion in the upper inner breast with an associated punctate calcification measuring 1.1 cm.  Anterior to this is a more subtle area of focal architectural distortion.  Overall the area spans approximately 4 cm   06/20/2017 Breast US   Targeted ultrasound of the right breast reveals at 2 o'clock position 3-4 cm from the nipple there is a subtle irregular hypoechoic area with shadowing measuring 8 mm.  Incidentally noted at 3 o'clock position 6 cm from the nipple is an oval well-circumscribed hypoechoic mass measuring 1.6 x 0.8 x 1.1 cm   06/30/2017 Initial Biopsy   [place] needle core biopsy:  Diagnosis: Fibrotic tissue with increased adenosis, no tumor seen. Immunostains returned.  All glandular elements in the  core biopsies are positive for CK 5/6, high molecular weight cytokeratin and E-Cadherin.  There is no focal increase in proliferation with a Ki-67 stain.  Both p63 and smooth muscle myosin exhibit intact basilar and myoepithelial layers surrounding all glandular elements in the core biopsy sections.  These findings support a benign diagnosis.   07/12/2017 Mammogram   Diagnostic mammogram, post biopsy clip imaging: Biopsy clip is approximately 1.3 cm medial to the suspicious mass which was biopsied on [place]. There is a persistent asymmetry at the site of concern in the medial right breast middle depth. Further biopsy was recommended    08/03/2017 Pathology Results   Diagnosis 1. Breast, right, needle core biopsy, UIQ - INVASIVE MAMMARY CARCINOMA. - MAMMARY CARCINOMA IN SITU. - SEE MICROSCOPIC DESCRIPTION. 2. Breast, right, needle core biopsy, UIQ - INVASIVE MAMMARY CARCINOMA. - MAMMARY CARCINOMA IN SITU. - SEE MICROSCOPIC DESCRIPTION.  Microscopic Comment 1. There is invasive mammary carcinoma which has features suggestive of invasive lobular carcinoma. There is also a separate component which may represent an invasive ductal component. E-Cadherin immunohistochemistry will be performed as well as prognostic profile. 2. E-Cadherin and breast prognostic profile will be performed.  ADDENDUM: 1,2. Immunohistochemistry for E-Cadherin shows an invasive and in situ component that is E-Cadherin negative consistent with lobular carcinoma. There is also a separate morphologic component which is strongly E-Cadherin positive consistent with invasive and in situ ductal carcinoma. Basal cell markers for p63, calponin and smooth muscle myosin are negative in the invasive component supporting the diagnosis. (JDP:kh 08-04-17)  1. PROGNOSTIC INDICATORS Results: IMMUNOHISTOCHEMICAL AND MORPHOMETRIC ANALYSIS PERFORMED MANUALLY Estrogen Receptor: 100%, POSITIVE,  STRONG STAINING INTENSITY Progesterone Receptor: 10%,  POSITIVE, STRONG STAINING INTENSITY Proliferation Marker Ki67: 20%  1. FLUORESCENCE IN-SITU HYBRIDIZATION Results: HER2 - NEGATIVE RATIO OF HER2/CEP17 SIGNALS 1.41 AVERAGE HER2 COPY NUMBER PER CELL 1.90  2. PROGNOSTIC INDICATORS Results: IMMUNOHISTOCHEMICAL AND MORPHOMETRIC ANALYSIS PERFORMED MANUALLY Estrogen Receptor: 100%, POSITIVE, STRONG STAINING INTENSITY Progesterone Receptor: 20%, POSITIVE, STRONG STAINING INTENSITY Proliferation Marker Ki67: 20%  2. FLUORESCENCE IN-SITU HYBRIDIZATION Results: HER2 - NEGATIVE RATIO OF HER2/CEP17 SIGNALS 1.25 AVERAGE HER2 COPY NUMBER PER CELL 2.00   08/19/2017 Initial Diagnosis   Malignant neoplasm of upper-inner quadrant of breast in female, estrogen receptor positive ([place])   08/19/2017 Cancer Staging   Staging form: Breast, AJCC 8th Edition - Clinical stage from 08/19/2017: cT1c, cN0, cM0, ER+, PR+, HER2- - Signed by Alla Feeling, NP on 08/19/2017     CANCER STAGING: Cancer Staging  Malignant neoplasm of upper-inner quadrant of breast in female, estrogen receptor positive ([place]) Staging form: Breast, AJCC 8th Edition - Clinical stage from 08/19/2017: cT1c, cN0, cM0, ER+, PR+, HER2- - Signed by Alla Feeling, NP on 08/19/2017 - Pathologic: No stage assigned - Unsigned    INTERVAL HISTORY:  Ms. Huot 61 y.o. female seen for follow-up of right breast cancer.  She is tolerating tamoxifen reasonably well.  Reports having hot flashes mostly during daytime, about 7 to 8/day.  No other side effects from tamoxifen reported.    REVIEW OF SYSTEMS:  Review of Systems  Respiratory:  Positive for cough and shortness of breath.   All other systems reviewed and are negative.    PAST MEDICAL/SURGICAL HISTORY:  Past Medical History:  Diagnosis Date   Breast cancer ([place])    Cancer ([place])    renal cell carcinoma 2007 s/p partial right nephrectomy    History of kidney cancer    Hypertension    Personal history of chemotherapy    Personal  history of radiation therapy    Sleep apnea    Past Surgical History:  Procedure Laterality Date   BREAST LUMPECTOMY Right 2019   BREAST SURGERY     CESAREAN SECTION  1994   HYSTEROSCOPY WITH D & C N/A 03/12/2020   Procedure: Hysteroscopy Uterine Curettage;  Surgeon: Florian Buff, MD;  Location: [facility];  Service: Gynecology;  Laterality: N/A;   HYSTEROSCOPY WITH D & C N/A 11/11/2020   Procedure: DILATATION AND CURETTAGE /HYSTEROSCOPY;  Surgeon: Janyth Pupa, DO;  Location: [facility];  Service: Gynecology;  Laterality: N/A;   POLYPECTOMY N/A 11/11/2020   Procedure: Myosure POLYPECTOMY;  Surgeon: Janyth Pupa, DO;  Location: [facility];  Service: Gynecology;  Laterality: N/A;     SOCIAL HISTORY:  Social History   Socioeconomic History   Marital status: Married    Spouse name: Not on file   Number of children: 4   Years of education: Not on file   Highest education level: Not on file  Occupational History   Occupation: makes cookies    Employer: [employer]  Tobacco Use   Smoking status: Never   Smokeless tobacco: Never  Vaping Use   Vaping Use: Never used  Substance and Sexual Activity   Alcohol use: Yes    Alcohol/week: 4.0 standard drinks of alcohol    Types: 4 Cans of beer per week    Comment: 4 beers per day   Drug use: Yes    Types: Marijuana   Sexual activity: Not Currently    Birth control/protection: Post-menopausal  Other Topics Concern   Not on file  Social History Narrative   Not on file   Social Determinants of Health   Financial Resource Strain: Low Risk  (12/27/2019)   Overall Financial Resource Strain (CARDIA)    Difficulty of Paying Living Expenses: Not hard at all  Food Insecurity: No Food Insecurity (12/27/2019)   Hunger Vital Sign    Worried About Running Out of Food in the Last Year: Never true    Ran Out of Food in the Last Year: Never true  Transportation Needs: No Transportation Needs (12/27/2019)   PRAPARE - Radiographer, therapeutic (Medical): No    Lack of Transportation (Non-Medical): No  Physical Activity: Insufficiently Active (12/27/2019)   Exercise Vital Sign    Days of Exercise per Week: 2 days    Minutes of Exercise per Session: 20 min  Stress: No Stress Concern Present (12/27/2019)   [place]    Feeling of Stress : Only a little  Social Connections: Moderately Integrated (12/27/2019)   Social Connection and Isolation Panel [NHANES]    Frequency of Communication with Friends and Family: Three times a week    Frequency of Social Gatherings with Friends and Family: Three times a week    Attends Religious Services: 1 to 4 times per year    Active Member of Clubs or Organizations: No    Attends Archivist Meetings: Never    Marital Status: Married  Human resources officer Violence: Not At Risk (12/27/2019)   Humiliation, Afraid, Rape, and Kick questionnaire    Fear of Current or Ex-Partner: No    Emotionally Abused: No    Physically Abused: No    Sexually Abused: No    FAMILY HISTORY:  Family History  Problem Relation Age of Onset   Cancer Maternal Grandmother        stomach   Cancer Maternal Grandfather        kidney cancer   Other Father        auto accident    CURRENT MEDICATIONS:  Outpatient Encounter Medications as of 02/10/2022  Medication Sig   amLODipine (NORVASC) 5 MG tablet Take 5 mg by mouth daily.    benazepril-hydrochlorthiazide (LOTENSIN HCT) 5-6.25 MG tablet Take 1 tablet by mouth daily.   citalopram (CELEXA) 40 MG tablet TAKE 1 TABLET BY MOUTH EVERY DAY   clotrimazole-betamethasone (LOTRISONE) cream Apply small amount to affected area twice daily as needed   dicyclomine (BENTYL) 20 MG tablet Take 1 tablet (20 mg total) by mouth 2 (two) times daily.   gabapentin (NEURONTIN) 100 MG capsule Take 100 mg by mouth in the morning and at bedtime.   Multiple Vitamin (MULTIVITAMIN) tablet Take 1 tablet by  mouth daily.   omeprazole (PRILOSEC) 40 MG capsule Take 40 mg by mouth daily.   ondansetron (ZOFRAN) 4 MG tablet Take 1 tablet (4 mg total) by mouth every 6 (six) hours.   tamoxifen (NOLVADEX) 20 MG tablet Take 1 tablet (20 mg total) by mouth daily.   No facility-administered encounter medications on file as of 02/10/2022.    ALLERGIES:  No Known Allergies   PHYSICAL EXAM:  ECOG Performance status: 0  Vitals:   02/10/22 1538  BP: 133/84  Pulse: 80  Resp: 17  Temp: 98.4 F (36.9 C)  SpO2: 95%   Filed Weights   02/10/22 1538  Weight: 199 lb 14.4 oz (90.7 kg)   Physical Exam Vitals reviewed.  Constitutional:      Appearance: Normal  appearance.  Cardiovascular:     Rate and Rhythm: Normal rate and regular rhythm.     Pulses: Normal pulses.     Heart sounds: Normal heart sounds.  Pulmonary:     Effort: Pulmonary effort is normal.     Breath sounds: Normal breath sounds.  Abdominal:     General: There is no distension.     Palpations: Abdomen is soft. There is no mass.  Neurological:     Mental Status: She is alert.  Psychiatric:        Mood and Affect: Mood normal.        Behavior: Behavior normal.      LABORATORY DATA:  I have reviewed the labs as listed.  CBC    Component Value Date/Time   WBC 4.2 02/03/2022 1052   RBC 4.20 02/03/2022 1052   HGB 12.5 02/03/2022 1052   HCT 36.8 02/03/2022 1052   PLT 218 02/03/2022 1052   MCV 87.6 02/03/2022 1052   MCH 29.8 02/03/2022 1052   MCHC 34.0 02/03/2022 1052   RDW 14.0 02/03/2022 1052   LYMPHSABS 1.8 02/03/2022 1052   MONOABS 0.4 02/03/2022 1052   EOSABS 0.1 02/03/2022 1052   BASOSABS 0.0 02/03/2022 1052      Latest Ref Rng & Units 02/03/2022   10:52 AM 05/06/2021    6:53 PM 10/31/2020    3:37 PM  CMP  Glucose 70 - 99 mg/dL 106  95  100   BUN 6 - 20 mg/dL _0 Creatinine 0.44 - 1.00 mg/dL 0.67  0.62  0.69   Sodium 135 - 145 mmol/L 139  138  135   Potassium 3.5 - 5.1 mmol/L 3.7  3.4  3.6    Chloride 98 - 111 mmol/L 110  105  105   CO2 22 - 32 mmol/L _1 Calcium 8.9 - 10.3 mg/dL 8.5  8.9  8.7   Total Protein 6.5 - 8.1 g/dL 6.9   6.9   Total Bilirubin 0.3 - 1.2 mg/dL 0.5   0.4   Alkaline Phos 38 - 126 U/L 92   93   AST 15 - 41 U/L 24   22   ALT 0 - 44 U/L 26   25     DIAGNOSTIC IMAGING:  I have independently reviewed the scans and discussed with the patient.  ASSESSMENT:  Stage I (T1CN0) right breast IDC, ER 3+, PR 3+, HER2 negative: - 08/03/2017: Right breast UIQ biopsy: Invasive lobular carcinoma, ER 100%, PR 20%, Ki-67 20%, HER2 negative. - Lumpectomy was done in [place].  1.7 cm PT1CN0 infiltrating ductal carcinoma ER 3+, PR 3+, HER2 negative. - Status post XRT in [place]. - Tamoxifen started in December 2019.  LMP was in October 2019.    Social/family history: - She lives at home with her husband.  She sits with elderly people, works for [employer]. - Maternal grandmother had stomach cancer.  Maternal uncle had lung cancer.    PLAN:  1.  Stage I (T1c N0) right breast IDC, ER 3+, PR 3+, HER2 negative: - She is tolerating tamoxifen well except for hot flashes. - Physical examination today shows the right breast lumpectomy scar in the upper inner quadrant is within normal limits with some thickening below the scar which is stable.  No palpable masses in bilateral breast.  No palpable adenopathy. - Mammogram on 02/03/2022 BI-RADS Category 1. - Labs from 02/03/2022 with LFTs are normal. -  We talked about BCI testing to see if extended therapy is helpful.  She is agreeable.  Will send specimen for testing. - We also talked about genetic testing given her history of breast cancer and kidney cancer and her family history.  She is agreeable.  Will make a referral to geneticist. - RTC 6 months for follow-up with repeat labs and exam.  2.  Right kidney cancer: - Diagnosed in 2007, status post right partial nephrectomy. - CTAP on 10/31/2020 showed  3.7 cm hemorrhagic cyst in the upper pole of the right kidney. - Recommend renal ultrasound.  3.  Vasomotor symptoms: - She reports daytime hot flashes, 7-8/day interfering with activities. - Will stop citalopram.  Will start her on Effexor 37.5 mg daily, to be titrated up to 75 mg daily.  4.  Bone health: - Vitamin D level is normal at 30. - DEXA scan on 02/03/2022 with T-score -0.3. - Continue calcium and vitamin D supplements.      Orders placed this encounter:  Orders Placed This Encounter  Procedures   CBC with Differential/Platelet   Comprehensive metabolic panel   VITAMIN D 25 Hydroxy (Vit-D Deficiency, Fractures)      Derek Jack, MD [place] [phone]

## 2022-02-11 ENCOUNTER — Ambulatory Visit (HOSPITAL_COMMUNITY)
Admission: RE | Admit: 2022-02-11 | Discharge: 2022-02-11 | Disposition: A | Discharge status: Home / Self Care | Payer: BC Managed Care – PPO | Source: Ambulatory Visit | Attending: Hematology | Admitting: Hematology

## 2022-02-11 DIAGNOSIS — N281 Cyst of kidney, acquired: Secondary | ICD-10-CM | POA: Diagnosis not present

## 2022-02-11 DIAGNOSIS — N289 Disorder of kidney and ureter, unspecified: Secondary | ICD-10-CM | POA: Insufficient documentation

## 2022-02-11 DIAGNOSIS — Z905 Acquired absence of kidney: Secondary | ICD-10-CM | POA: Insufficient documentation

## 2022-02-12 ENCOUNTER — Other Ambulatory Visit: Payer: Self-pay | Admitting: *Deleted

## 2022-02-12 ENCOUNTER — Other Ambulatory Visit: Payer: Self-pay

## 2022-02-12 MED ORDER — VENLAFAXINE HCL 37.5 MG PO TABS: ORAL_TABLET | ORAL | 3 refills | Status: DC

## 2022-02-12 NOTE — Telephone Encounter (Signed)
Patient called to check on medication physician had prescribed at office visit. Effexor was sent to [facility] on [address] Per patient's request.

## 2022-02-16 ENCOUNTER — Encounter: Payer: Self-pay | Admitting: Obstetrics & Gynecology

## 2022-02-16 ENCOUNTER — Ambulatory Visit (INDEPENDENT_AMBULATORY_CARE_PROVIDER_SITE_OTHER): Payer: BC Managed Care – PPO | Admitting: Obstetrics & Gynecology

## 2022-02-16 VITALS — Ht 66.0 in | Wt 203.0 lb

## 2022-02-16 DIAGNOSIS — N951 Menopausal and female climacteric states: Secondary | ICD-10-CM | POA: Diagnosis not present

## 2022-02-16 DIAGNOSIS — Z01419 Encounter for gynecological examination (general) (routine) without abnormal findings: Secondary | ICD-10-CM

## 2022-02-16 MED ORDER — VEOZAH 45 MG PO TABS: 1.0000 | ORAL_TABLET | Freq: Every day | ORAL | 11 refills | Status: DC

## 2022-02-16 NOTE — Progress Notes (Signed)
Subjective:     Jennifer Harvey is a 61 y.o. female here for a routine exam.  Patient's last menstrual period was 04/29/2020 (within weeks). G4P4 Birth Control Method:  menopausal Menstrual Calendar(currently): amenorrheic  Current complaints: none.   Current acute medical issues:  VMS: 15-20 per day and 4 night sweats   Recent Gynecologic History Patient's last menstrual period was 04/29/2020 (within weeks). Last Pap: 2022,  normal Last mammogram: 11/23,  normal  Past Medical History:  Diagnosis Date   Breast cancer ([place])    Cancer ([place])    renal cell carcinoma 2007 s/p partial right nephrectomy    History of kidney cancer    Hypertension    Personal history of chemotherapy    Personal history of radiation therapy    Sleep apnea     Past Surgical History:  Procedure Laterality Date   BREAST LUMPECTOMY Right 2019   BREAST SURGERY     CESAREAN SECTION  1994   HYSTEROSCOPY WITH D & C N/A 03/12/2020   Procedure: Hysteroscopy Uterine Curettage;  Surgeon: Florian Buff, MD;  Location: [facility];  Service: Gynecology;  Laterality: N/A;   HYSTEROSCOPY WITH D & C N/A 11/11/2020   Procedure: DILATATION AND CURETTAGE /HYSTEROSCOPY;  Surgeon: Janyth Pupa, DO;  Location: [facility];  Service: Gynecology;  Laterality: N/A;   POLYPECTOMY N/A 11/11/2020   Procedure: Myosure POLYPECTOMY;  Surgeon: Janyth Pupa, DO;  Location: [facility];  Service: Gynecology;  Laterality: N/A;    OB History     Gravida  4   Para      Term      Preterm      AB      Living  3      SAB      IAB      Ectopic      Multiple      Live Births  3           Social History   Socioeconomic History   Marital status: Married    Spouse name: Not on file   Number of children: 4   Years of education: Not on file   Highest education level: Not on file  Occupational History   Occupation: makes cookies    Employer: [employer]  Tobacco Use   Smoking status: Never   Smokeless tobacco: Never   Vaping Use   Vaping Use: Never used  Substance and Sexual Activity   Alcohol use: Yes    Alcohol/week: 2.0 standard drinks of alcohol    Types: 2 Cans of beer per week    Comment: 2 beers per day   Drug use: Yes    Types: Marijuana   Sexual activity: Not Currently    Birth control/protection: Post-menopausal  Other Topics Concern   Not on file  Social History Narrative   Not on file   Social Determinants of Health   Financial Resource Strain: High Risk (02/16/2022)   Overall Financial Resource Strain (CARDIA)    Difficulty of Paying Living Expenses: Hard  Food Insecurity: Food Insecurity Present (02/16/2022)   Hunger Vital Sign    Worried About Running Out of Food in the Last Year: Never true    Ran Out of Food in the Last Year: Sometimes true  Transportation Needs: No Transportation Needs (02/16/2022)   PRAPARE - Hydrologist (Medical): No    Lack of Transportation (Non-Medical): No  Physical Activity: Inactive (02/16/2022)   Exercise Vital Sign  Days of Exercise per Week: 0 days    Minutes of Exercise per Session: 0 min  Stress: No Stress Concern Present (02/16/2022)   [place]    Feeling of Stress : Only a little  Social Connections: Moderately Integrated (02/16/2022)   Social Connection and Isolation Panel [NHANES]    Frequency of Communication with Friends and Family: More than three times a week    Frequency of Social Gatherings with Friends and Family: Once a week    Attends Religious Services: 1 to 4 times per year    Active Member of [employer] or Organizations: No    Attends Music therapist: Never    Marital Status: Married    Family History  Problem Relation Age of Onset   Cancer Maternal Grandmother        stomach   Cancer Maternal Grandfather        kidney cancer   Other Father        auto accident     Current Outpatient Medications:     amLODipine (NORVASC) 5 MG tablet, Take 5 mg by mouth daily. , Disp: , Rfl:    benazepril-hydrochlorthiazide (LOTENSIN HCT) 5-6.25 MG tablet, Take 1 tablet by mouth daily., Disp: , Rfl:    clotrimazole-betamethasone (LOTRISONE) cream, Apply small amount to affected area twice daily as needed, Disp: 45 g, Rfl: 0   dicyclomine (BENTYL) 20 MG tablet, Take 1 tablet (20 mg total) by mouth 2 (two) times daily., Disp: 20 tablet, Rfl: 0   Fezolinetant (VEOZAH) 45 MG TABS, Take 1 tablet by mouth at bedtime., Disp: 30 tablet, Rfl: 11   gabapentin (NEURONTIN) 100 MG capsule, Take 100 mg by mouth in the morning and at bedtime., Disp: , Rfl:    Multiple Vitamin (MULTIVITAMIN) tablet, Take 1 tablet by mouth daily., Disp: , Rfl:    omeprazole (PRILOSEC) 40 MG capsule, Take 40 mg by mouth daily., Disp: , Rfl:    ondansetron (ZOFRAN) 4 MG tablet, Take 1 tablet (4 mg total) by mouth every 6 (six) hours., Disp: 12 tablet, Rfl: 0   TOPAMAX 25 MG tablet, Take 25 mg by mouth daily., Disp: , Rfl:    venlafaxine (EFFEXOR) 37.5 MG tablet, Take one tablet by mouth daily for seven days then increase to 2 tablets by mouth daily, Disp: 80 tablet, Rfl: 3   tamoxifen (NOLVADEX) 20 MG tablet, Take 1 tablet (20 mg total) by mouth daily. (Patient not taking: Reported on 02/16/2022), Disp: 30 tablet, Rfl: 0  Review of Systems  Review of Systems  Constitutional: Negative for fever, chills, weight loss, malaise/fatigue and diaphoresis.  HENT: Negative for hearing loss, ear pain, nosebleeds, congestion, sore throat, neck pain, tinnitus and ear discharge.   Eyes: Negative for blurred vision, double vision, photophobia, pain, discharge and redness.  Respiratory: Negative for cough, hemoptysis, sputum production, shortness of breath, wheezing and stridor.   Cardiovascular: Negative for chest pain, palpitations, orthopnea, claudication, leg swelling and PND.  Gastrointestinal: negative for abdominal pain. Negative for heartburn, nausea,  vomiting, diarrhea, constipation, blood in stool and melena.  Genitourinary: Negative for dysuria, urgency, frequency, hematuria and flank pain.  Musculoskeletal: Negative for myalgias, back pain, joint pain and falls.  Skin: Negative for itching and rash.  Neurological: Negative for dizziness, tingling, tremors, sensory change, speech change, focal weakness, seizures, loss of consciousness, weakness and headaches.  Endo/Heme/Allergies: Negative for environmental allergies and polydipsia. Does not bruise/bleed easily.  Psychiatric/Behavioral: Negative for depression,  suicidal ideas, hallucinations, memory loss and substance abuse. The patient is not nervous/anxious and does not have insomnia.        Objective:  Height '5\' 6"'$  (1.676 m), weight 203 lb (92.1 kg), last menstrual period 04/29/2020.   Physical Exam  Vitals reviewed. Constitutional: She is oriented to person, place, and time. She appears well-developed and well-nourished.  HENT:  Head: Normocephalic and atraumatic.        Right Ear: External ear normal.  Left Ear: External ear normal.  Nose: Nose normal.  Mouth/Throat: Oropharynx is clear and moist.  Eyes: Conjunctivae and EOM are normal. Pupils are equal, round, and reactive to light. Right eye exhibits no discharge. Left eye exhibits no discharge. No scleral icterus.  Neck: Normal range of motion. Neck supple. No tracheal deviation present. No thyromegaly present.  Cardiovascular: Normal rate, regular rhythm, normal heart sounds and intact distal pulses.  Exam reveals no gallop and no friction rub.   No murmur heard. Respiratory: Effort normal and breath sounds normal. No respiratory distress. She has no wheezes. She has no rales. She exhibits no tenderness.  GI: Soft. Bowel sounds are normal. She exhibits no distension and no mass. There is no tenderness. There is no rebound and no guarding.  Genitourinary:  Breasts no masses skin changes or nipple changes bilaterally       Vulva is normal without lesions Vagina is pink moist without discharge Cervix normal in appearance and pap is done Uterus is normal size shape and contour Adnexa is negative with normal sized ovaries   Musculoskeletal: Normal range of motion. She exhibits no edema and no tenderness.  Neurological: She is alert and oriented to person, place, and time. She has normal reflexes. She displays normal reflexes. No cranial nerve deficit. She exhibits normal muscle tone. Coordination normal.  Skin: Skin is warm and dry. No rash noted. No erythema. No pallor.  Psychiatric: She has a normal mood and affect. Her behavior is normal. Judgment and thought content normal.       Medications Ordered at today's visit: Meds ordered this encounter  Medications   Fezolinetant (VEOZAH) 45 MG TABS    Sig: Take 1 tablet by mouth at bedtime.    Dispense:  30 tablet    Refill:  11    Other orders placed at today's visit: No orders of the defined types were placed in this encounter.     Assessment:    Normal Gyn exam.   VMS: severe(15-20 hot flashes per day, 4 night sweats per night Plan:    Hormone replacement therapy: begin Veozah 45 mg daily samples given and 28 sample tabs. Follow up in: 6 weeks. Veozah follow up, check LFTs      Return in about 6 weeks (around 03/30/2022) for Follow up, with Dr Elonda Husky: veozah therapy eval.

## 2022-02-19 ENCOUNTER — Encounter: Payer: Self-pay | Admitting: *Deleted

## 2022-03-04 DIAGNOSIS — G63 Polyneuropathy in diseases classified elsewhere: Secondary | ICD-10-CM | POA: Diagnosis not present

## 2022-03-04 DIAGNOSIS — Z6834 Body mass index (BMI) 34.0-34.9, adult: Secondary | ICD-10-CM | POA: Diagnosis not present

## 2022-03-04 DIAGNOSIS — Z8249 Family history of ischemic heart disease and other diseases of the circulatory system: Secondary | ICD-10-CM | POA: Diagnosis not present

## 2022-03-04 DIAGNOSIS — Z833 Family history of diabetes mellitus: Secondary | ICD-10-CM | POA: Diagnosis not present

## 2022-03-04 DIAGNOSIS — R69 Illness, unspecified: Secondary | ICD-10-CM | POA: Diagnosis not present

## 2022-03-04 DIAGNOSIS — K219 Gastro-esophageal reflux disease without esophagitis: Secondary | ICD-10-CM | POA: Diagnosis not present

## 2022-03-04 DIAGNOSIS — E669 Obesity, unspecified: Secondary | ICD-10-CM | POA: Diagnosis not present

## 2022-03-04 DIAGNOSIS — Z809 Family history of malignant neoplasm, unspecified: Secondary | ICD-10-CM | POA: Diagnosis not present

## 2022-03-04 DIAGNOSIS — I10 Essential (primary) hypertension: Secondary | ICD-10-CM | POA: Diagnosis not present

## 2022-03-04 DIAGNOSIS — Z825 Family history of asthma and other chronic lower respiratory diseases: Secondary | ICD-10-CM | POA: Diagnosis not present

## 2022-03-04 DIAGNOSIS — C50919 Malignant neoplasm of unspecified site of unspecified female breast: Secondary | ICD-10-CM | POA: Diagnosis not present

## 2022-03-04 DIAGNOSIS — G3184 Mild cognitive impairment, so stated: Secondary | ICD-10-CM | POA: Diagnosis not present

## 2022-03-11 DIAGNOSIS — Z170 Estrogen receptor positive status [ER+]: Secondary | ICD-10-CM | POA: Diagnosis not present

## 2022-03-11 DIAGNOSIS — C50211 Malignant neoplasm of upper-inner quadrant of right female breast: Secondary | ICD-10-CM | POA: Diagnosis not present

## 2022-03-21 DIAGNOSIS — G4733 Obstructive sleep apnea (adult) (pediatric): Secondary | ICD-10-CM | POA: Diagnosis not present

## 2022-03-24 ENCOUNTER — Telehealth: Payer: Self-pay | Admitting: Obstetrics & Gynecology

## 2022-03-24 NOTE — Telephone Encounter (Signed)
Pt is requesting Veozah to be sent to [facility] on [address]

## 2022-03-24 NOTE — Telephone Encounter (Signed)
Spoke with patient. States the pharmacy notified her that they had to order it and it will be in tomorrow.

## 2022-03-25 ENCOUNTER — Telehealth: Payer: Self-pay

## 2022-03-25 NOTE — Telephone Encounter (Signed)
Patient called and stated that she was given some samples of Veozah.  When she tried to get it filled the cost was $600, patient would like to know if there is a coupon because the medication is working great for her.

## 2022-03-26 NOTE — Telephone Encounter (Signed)
Called pharmacy.  Pharmacist states medication is ready for pickup with no copay today.  Pt informed medication was ready but after this month, unsure of cost of medication. Pt very happy and will pickup today.

## 2022-03-30 ENCOUNTER — Ambulatory Visit: Payer: BC Managed Care – PPO | Admitting: Obstetrics & Gynecology

## 2022-04-21 DIAGNOSIS — G4733 Obstructive sleep apnea (adult) (pediatric): Secondary | ICD-10-CM | POA: Diagnosis not present

## 2022-05-13 ENCOUNTER — Telehealth: Payer: Self-pay | Admitting: *Deleted

## 2022-05-13 NOTE — Telephone Encounter (Signed)
Left message @ 4:59 pm asking pt if she has a coupon card for [employer]. I have a PA for med. [place]

## 2022-05-19 ENCOUNTER — Encounter: Payer: Self-pay | Admitting: *Deleted

## 2022-05-19 NOTE — Telephone Encounter (Signed)
Mychart message sent to pt, asking if she has a coupon card for [facility] and if she has gotten this med. [place]

## 2022-05-20 DIAGNOSIS — G4733 Obstructive sleep apnea (adult) (pediatric): Secondary | ICD-10-CM | POA: Diagnosis not present

## 2022-05-26 NOTE — Telephone Encounter (Signed)
Left message asking pt if she has gotten Veozah and if she has a coupon card. Advised can respond to MyChart message. [place]

## 2022-05-27 NOTE — Telephone Encounter (Signed)
Multiple attempts to reach pt by phone and mychart. Closing encounter. [place]

## 2022-06-08 ENCOUNTER — Other Ambulatory Visit: Payer: Self-pay | Admitting: Hematology

## 2022-06-08 ENCOUNTER — Other Ambulatory Visit: Payer: Self-pay | Admitting: *Deleted

## 2022-06-08 MED ORDER — VENLAFAXINE HCL 75 MG PO TABS: 75.0000 mg | ORAL_TABLET | Freq: Every day | ORAL | 0 refills | Status: DC

## 2022-06-08 NOTE — Telephone Encounter (Signed)
Tamoxifen refill approved.  Patient is tolerating and is to continue therapy. 

## 2022-06-17 NOTE — Telephone Encounter (Signed)
done

## 2022-06-20 DIAGNOSIS — G4733 Obstructive sleep apnea (adult) (pediatric): Secondary | ICD-10-CM | POA: Diagnosis not present

## 2022-06-22 ENCOUNTER — Encounter (HOSPITAL_COMMUNITY): Payer: Self-pay

## 2022-06-22 ENCOUNTER — Emergency Department (HOSPITAL_COMMUNITY): Payer: BC Managed Care – PPO

## 2022-06-22 ENCOUNTER — Emergency Department (HOSPITAL_COMMUNITY)
Admission: EM | Admit: 2022-06-22 | Discharge: 2022-06-22 | Disposition: A | Discharge status: Home / Self Care | Payer: BC Managed Care – PPO | Attending: Emergency Medicine | Admitting: Emergency Medicine

## 2022-06-22 ENCOUNTER — Other Ambulatory Visit: Payer: Self-pay

## 2022-06-22 DIAGNOSIS — Z79899 Other long term (current) drug therapy: Secondary | ICD-10-CM | POA: Diagnosis not present

## 2022-06-22 DIAGNOSIS — I10 Essential (primary) hypertension: Secondary | ICD-10-CM | POA: Diagnosis not present

## 2022-06-22 DIAGNOSIS — R140 Abdominal distension (gaseous): Secondary | ICD-10-CM | POA: Diagnosis not present

## 2022-06-22 DIAGNOSIS — R6881 Early satiety: Secondary | ICD-10-CM | POA: Insufficient documentation

## 2022-06-22 DIAGNOSIS — Z853 Personal history of malignant neoplasm of breast: Secondary | ICD-10-CM | POA: Insufficient documentation

## 2022-06-22 DIAGNOSIS — R109 Unspecified abdominal pain: Secondary | ICD-10-CM | POA: Diagnosis not present

## 2022-06-22 DIAGNOSIS — R739 Hyperglycemia, unspecified: Secondary | ICD-10-CM | POA: Diagnosis not present

## 2022-06-22 DIAGNOSIS — K760 Fatty (change of) liver, not elsewhere classified: Secondary | ICD-10-CM | POA: Diagnosis not present

## 2022-06-22 LAB — URINALYSIS, ROUTINE W REFLEX MICROSCOPIC
Bilirubin Urine: NEGATIVE
Glucose, UA: NEGATIVE mg/dL
Hgb urine dipstick: NEGATIVE
Ketones, ur: NEGATIVE mg/dL
Leukocytes,Ua: NEGATIVE
Nitrite: NEGATIVE
Protein, ur: NEGATIVE mg/dL
Specific Gravity, Urine: 1.013 (ref 1.005–1.030)
pH: 6 (ref 5.0–8.0)

## 2022-06-22 LAB — COMPREHENSIVE METABOLIC PANEL
ALT: 28 U/L (ref 0–44)
AST: 23 U/L (ref 15–41)
Albumin: 3.8 g/dL (ref 3.5–5.0)
Alkaline Phosphatase: 93 U/L (ref 38–126)
Anion gap: 5 (ref 5–15)
BUN: 10 mg/dL (ref 6–20)
CO2: 24 mmol/L (ref 22–32)
Calcium: 8.8 mg/dL — ABNORMAL LOW (ref 8.9–10.3)
Chloride: 107 mmol/L (ref 98–111)
Creatinine, Ser: 0.63 mg/dL (ref 0.44–1.00)
GFR, Estimated: >60 mL/min (ref >60)
Glucose, Bld: 101 mg/dL — ABNORMAL HIGH (ref 70–99)
Potassium: 3.7 mmol/L (ref 3.5–5.1)
Sodium: 136 mmol/L (ref 135–145)
Total Bilirubin: 0.5 mg/dL (ref 0.3–1.2)
Total Protein: 6.8 g/dL (ref 6.5–8.1)

## 2022-06-22 LAB — LIPASE, BLOOD: Lipase: 37 U/L (ref 11–51)

## 2022-06-22 LAB — CBC
HCT: 37.5 % (ref 36.0–46.0)
Hemoglobin: 12.7 g/dL (ref 12.0–15.0)
MCH: 30.5 pg (ref 26.0–34.0)
MCHC: 33.9 g/dL (ref 30.0–36.0)
MCV: 89.9 fL (ref 80.0–100.0)
Platelets: 219 10*3/uL (ref 150–400)
RBC: 4.17 MIL/uL (ref 3.87–5.11)
RDW: 13.2 % (ref 11.5–15.5)
WBC: 4.3 10*3/uL (ref 4.0–10.5)
nRBC: 0 % (ref 0.0–0.2)

## 2022-06-22 MED ORDER — OMEPRAZOLE 40 MG PO CPDR: 40.0000 mg | DELAYED_RELEASE_CAPSULE | Freq: Every day | ORAL | 0 refills | Status: DC

## 2022-06-22 MED ORDER — IOHEXOL 300 MG/ML  SOLN
100.0000 mL | Freq: Once | INTRAMUSCULAR | Status: AC | PRN
  Administered 2022-06-22: 100 mL via INTRAVENOUS

## 2022-06-22 NOTE — ED Notes (Signed)
See triage notes. Feeling bloated, always hungry for about a month now. Some nausea. States had runny stool sometimes.

## 2022-06-22 NOTE — Discharge Instructions (Addendum)
You were seen in the ER today for evaluation of your abdominal bloating as well as your early satiety.  Your CT does not show any signs of acute causes.  Your lab work is overall unremarkable.  This could be from your acid reflux.  Given your history of cancer however, I would like for you to follow-up with your GI provider.  If for the reason you cannot get in with your provider in [place], I have included the number and information for Dr. Levon Hedger who is our GI provider here.  Please also follow-up with your nurse petitioner.  I have started you back on your omeprazole 40 mg that you took daily.  I given you a 2-week supply of this.  I have also included some information on eating plan with acid reflux.  If you have any concerns, new or worsening symptoms, please return to the nearest emergency department for evaluation.  Contact a health care provider if: You have nausea and vomiting. You have diarrhea. You have abdominal pain. You have unusual weight loss or weight gain. You have severe pain, and medicines do not help. Get help right away if: You have chest pain. You have trouble breathing. You have shortness of breath. You have trouble urinating. You have darker urine than normal. You have blood in your stools or have dark, tarry stools. These symptoms may represent a serious problem that is an emergency. Do not wait to see if the symptoms will go away. Get medical help right away. Call your local emergency services (911 in the [place] Do not drive yourself to the hospital.

## 2022-06-22 NOTE — ED Provider Notes (Signed)
[place] EMERGENCY DEPARTMENT AT [facility] Provider Note   CSN: [id number] Arrival date & time: 06/22/22  1059     History Chief Complaint  Patient presents with   Bloated    Jennifer Harvey is a 62 y.o. female with history of hypertension, hyperlipidemia, breast cancer in remission on tamoxifen, acid reflux presents to the emergency department for evaluation of feeling bloated for the past month.  She reports that she feels hungry "all the time" but then will occasionally feel nauseous.  She reports that whenever she eats a small amount of food she feels full but then an hour later feels hungry.  She reports that she has been having some abdominal distention after eating as well.  She reports that she has about 1 episode of "oily stools" a day and then has 2 out of normal stools after that.  She denies any melena or any hematochezia.  She denies any abdominal pain reports she just feels bloated all the time.  She denies any chest pain, shortness of breath, dysuria, hematuria, vaginal bleeding, vaginal discharge.  She has appointment with her PCP on 07-06-2022.  She reports that she has tried ginger root for symptoms but has not had any relief of symptoms. She had an endoscopy for her GERD last year, but has not been able to take her acid reflux medication for the past months d/t insurance. No other medications tried.  She has no known drug allergies.  Denies any tobacco or EtOH.  Smokes marijuana daily.  HPI     Home Medications Prior to Admission medications   Medication Sig Start Date End Date Taking? Authorizing Provider  amLODipine (NORVASC) 5 MG tablet Take 5 mg by mouth daily.  07/29/17   [provider]  benazepril-hydrochlorthiazide (LOTENSIN HCT) 5-6.25 MG tablet Take 1 tablet by mouth daily.    [provider]  clotrimazole-betamethasone (LOTRISONE) cream Apply small amount to affected area twice daily as needed 07/16/21   Myna Hidalgo, DO   dicyclomine (BENTYL) 20 MG tablet Take 1 tablet (20 mg total) by mouth 2 (two) times daily. 12/16/19   Couture, Cortni S, PA-C  Fezolinetant (VEOZAH) 45 MG TABS Take 1 tablet by mouth at bedtime. 02/16/22   Lazaro Arms, MD  gabapentin (NEURONTIN) 100 MG capsule Take 100 mg by mouth in the morning and at bedtime. 10/09/19   [provider]  Multiple Vitamin (MULTIVITAMIN) tablet Take 1 tablet by mouth daily.    [provider]  omeprazole (PRILOSEC) 40 MG capsule Take 1 capsule (40 mg total) by mouth daily. 06/22/22   Achille Rich, PA-C  ondansetron (ZOFRAN) 4 MG tablet Take 1 tablet (4 mg total) by mouth every 6 (six) hours. 10/31/20   Carroll Sage, PA-C  tamoxifen (NOLVADEX) 20 MG tablet TAKE 1 TABLET(20 MG) BY MOUTH DAILY 06/08/22   Doreatha Massed, MD  TOPAMAX 25 MG tablet Take 25 mg by mouth daily. 01/22/22   [provider]  venlafaxine (EFFEXOR) 75 MG tablet Take 1 tablet (75 mg total) by mouth daily. 06/08/22   Doreatha Massed, MD      Allergies    Patient has no known allergies.    Review of Systems   Review of Systems  Constitutional:  Positive for appetite change. Negative for chills, fever and unexpected weight change.  Respiratory:  Negative for shortness of breath.   Cardiovascular:  Negative for chest pain.  Gastrointestinal:  Positive for abdominal distention and nausea. Negative for abdominal  pain, constipation, diarrhea and vomiting.  Genitourinary:  Negative for dysuria and hematuria.  See HPI  Physical Exam Updated Vital Signs BP 126/75   Pulse 79   Temp 98.4 F (36.9 C) (Oral)   Resp 17   Ht  (1.676 m)   Wt 92.1 kg   LMP 04/29/2020 (Within Weeks) Comment: Bleeding  SpO2 97%   BMI 32.77 kg/m  Physical Exam Vitals and nursing note reviewed.  Constitutional:      General: She is not in acute distress.    Appearance: Normal appearance. She is not ill-appearing or toxic-appearing.  HENT:     Head: Normocephalic  and atraumatic.  Eyes:     General: No scleral icterus. Cardiovascular:     Rate and Rhythm: Normal rate and regular rhythm.  Pulmonary:     Effort: Pulmonary effort is normal. No respiratory distress.  Abdominal:     General: Bowel sounds are normal. There is no distension.     Palpations: Abdomen is soft.     Tenderness: There is no abdominal tenderness. There is no guarding or rebound.     Comments: Abdomen does not appear distended.  Nontender to palpation.  Soft.  No guarding or rebound.  Normal active bowel sounds.  Musculoskeletal:        General: No deformity.     Cervical back: Normal range of motion.  Skin:    General: Skin is warm and dry.  Neurological:     General: No focal deficit present.     Mental Status: She is alert. Mental status is at baseline.     ED Results / Procedures / Treatments   Labs (all labs ordered are listed, but only abnormal results are displayed) Labs Reviewed  COMPREHENSIVE METABOLIC PANEL - Abnormal; Notable for the following components:      Result Value   Glucose, Bld 101 (*)    Calcium 8.8 (*)    All other components within normal limits  LIPASE, BLOOD  CBC  URINALYSIS, ROUTINE W REFLEX MICROSCOPIC    EKG None  Radiology CT ABDOMEN PELVIS W CONTRAST  Result Date: 06/22/2022 CLINICAL DATA:  One month of intermittent abdominal pain and diarrhea. EXAM: CT ABDOMEN AND PELVIS WITH CONTRAST TECHNIQUE: Multidetector CT imaging of the abdomen and pelvis was performed using the standard protocol following bolus administration of intravenous contrast. RADIATION DOSE REDUCTION: This exam was performed according to the departmental dose-optimization program which includes automated exposure control, adjustment of the mA and/or kV according to patient size and/or use of iterative reconstruction technique. CONTRAST:  OMNIPAQUE IOHEXOL 300 MG/ML  SOLN COMPARISON:  CT abdomen pelvis 10/31/2020 FINDINGS: Lower chest: No acute abnormality.  Hepatobiliary: No focal liver abnormality is seen. Mild diffuse hepatic steatosis status post cholecystectomy. No biliary dilatation. Pancreas: Unremarkable. No pancreatic ductal dilatation or surrounding inflammatory changes. Spleen: Normal in size without focal abnormality. Adrenals/Urinary Tract: Adrenal glands are unremarkable. Redemonstrated bilateral renal cysts including a hemorrhagic cyst in the medial right kidney. No new focal liver lesion. No hydronephrosis or renal calculi. Urinary bladder is unremarkable. Stomach/Bowel: Stomach is within normal limits. No evidence of bowel wall thickening, distention, or inflammatory changes. Colonic diverticula without evidence of diverticulitis. Vascular/Lymphatic: No significant vascular findings are present. No enlarged abdominal or pelvic lymph nodes. Reproductive: Uterus and bilateral adnexa are unremarkable. Other: No abdominal wall hernia or abnormality. No abdominopelvic ascites. Musculoskeletal: No acute or significant osseous findings. IMPRESSION: 1. No acute findings in the abdomen or pelvis. 2. Colonic diverticulosis  without evidence of diverticulitis. 3. Mild diffuse hepatic steatosis. Electronically Signed   By: Emmaline Kluver M.D.   On: 06/22/2022 13:27     Procedures Procedures   Medications Ordered in ED Medications  iohexol (OMNIPAQUE) 300 MG/ML solution 100 mL (100 mLs Intravenous Contrast Given 06/22/22 1257)    ED Course/ Medical Decision Making/ A&P    Medical Decision Making Amount and/or Complexity of Data Reviewed Labs: ordered. Radiology: ordered.  Risk Prescription drug management.   62 y.o. female presents to the ER for evaluation of abdominal bloating and early satiety. . Differential diagnosis includes but is not limited to AAA, mesenteric ischemia, appendicitis, diverticulitis, DKA, gastroenteritis, nephrolithiasis, pancreatitis, constipation, UTI, bowel obstruction, biliary disease, IBD, PUD, hepatitis. Vital  signs show mildly elevated BP otherwise unremarkable. Physical exam as noted above.   Given the patient's h/o renal cancer and breast cancer, will order CT scan .  I independently reviewed and interpreted the patient's labs.  Lipase within normal limits.  CBC without cytosis or anemia.  Urinalysis is unremarkable.  CMP did show mildly elevated glucose at 101, mildly decreased calcium at 8.8, otherwise no electrolyte or LFT abnormality.  CT imaging shows 1. No acute findings in the abdomen or pelvis. 2. Colonic diverticulosis without evidence of diverticulitis. 3. Mild diffuse hepatic steatosis.  After consideration of the diagnostic results and the patients response to treatment, I feel that the patient is stable for discharge home.  Her symptoms could be related to her acid reflux.  I do recommend that she follow-up with her GI provider as she may possibly need another endoscopy.  She also has not been on her acid reflux medication for a few months.  She already sees a GI provider in [place] however I did provide her another 1 just in case she was not able to see a provider in [place] any longer.  I do not think this is any mesenteric ischemia as she does not have any pain with abdominal eating and does have some bloating.  CT scan does not show any appendicitis or diverticulitis.  She is not having any signs of gastroenteritis.  Lipase is normal and no pancreatic inflammation seen on CT imaging.  Urine does not show any signs of urinary tract infection.  CT does not show any signs of bowel obstruction either.  She is otherwise sitting on the stretcher in no acute distress and is comfortable.  I will prescribe her her medications given also told her she appeared these over-the-counter if she has any problems with her insurance.  We discussed the results of the labs/imaging. The plan is follow-up with GI and take your acid reflux medications.  Please follow the included list of foods to eat with GERD. We  discussed strict return precautions and red flag symptoms. The patient verbalized their understanding and agrees to the plan. The patient is stable and being discharged home in good condition.  Portions of this report may have been transcribed using voice recognition software. Every effort was made to ensure accuracy; however, inadvertent computerized transcription errors may be present.   Final Clinical Impression(s) / ED Diagnoses Final diagnoses:  Early satiety  Abdominal bloating    Rx / DC Orders ED Discharge Orders          Ordered    omeprazole (PRILOSEC) 40 MG capsule  Daily        06/22/22 1654              Achille Rich, [place] 06/25/22  1056    Tanda RockersGray, Samuel A, DO 07/01/22 1539

## 2022-06-22 NOTE — ED Triage Notes (Signed)
Reports her stomach is swelling and she feels hungry all the times.  Reports then she can't eat a lot.  Patient reports intermittent bouts of diarrhea.  Reports symptoms x 1 month.

## 2022-06-22 NOTE — ED Notes (Signed)
Family member approached nursing station and requested help letting pt use restroom. This RN went into room to [facility] pt from monitor. Pt very upset and loud with RN saying she has been calling since 330 for help. Pt ambulated well to restroom without assistance.

## 2022-06-24 ENCOUNTER — Telehealth: Payer: Self-pay | Admitting: Obstetrics & Gynecology

## 2022-06-24 NOTE — Telephone Encounter (Signed)
Called patient and heard message that user was not available.

## 2022-06-24 NOTE — Telephone Encounter (Signed)
Pt calling stating that the samples that she was given for veozah has been working really good and is wanting to know if she could get either a script for it sent to [facility] [address] [place]  or samples

## 2022-07-08 ENCOUNTER — Other Ambulatory Visit: Payer: Self-pay | Admitting: *Deleted

## 2022-07-08 MED ORDER — VEOZAH 45 MG PO TABS: 1.0000 | ORAL_TABLET | Freq: Every day | ORAL | 11 refills | Status: DC

## 2022-07-19 NOTE — Progress Notes (Unsigned)
GI Office Note    Referring Provider: Lenoria Chime, FNP Primary Care Physician:  Lenoria Chime, FNP  Primary Gastroenterologist:  Chief Complaint   No chief complaint on file.    History of Present Illness   Jennifer Harvey is a 62 y.o. female presenting today     Seen in ED 06/22/22 for bloating for one month. Feels hungry all the time. After eating feels full/bloated/distended. Tried ginger root.   Had EGD for GERD last year.   Sees GI in [place].   Medications   Current Outpatient Medications  Medication Sig Dispense Refill   amLODipine (NORVASC) 5 MG tablet Take 5 mg by mouth daily.      benazepril-hydrochlorthiazide (LOTENSIN HCT) 5-6.25 MG tablet Take 1 tablet by mouth daily.     clotrimazole-betamethasone (LOTRISONE) cream Apply small amount to affected area twice daily as needed 45 g 0   dicyclomine (BENTYL) 20 MG tablet Take 1 tablet (20 mg total) by mouth 2 (two) times daily. 20 tablet 0   Fezolinetant (VEOZAH) 45 MG TABS Take 1 tablet (45 mg total) by mouth at bedtime. 30 tablet 11   gabapentin (NEURONTIN) 100 MG capsule Take 100 mg by mouth in the morning and at bedtime.     Multiple Vitamin (MULTIVITAMIN) tablet Take 1 tablet by mouth daily.     omeprazole (PRILOSEC) 40 MG capsule Take 1 capsule (40 mg total) by mouth daily. 14 capsule 0   ondansetron (ZOFRAN) 4 MG tablet Take 1 tablet (4 mg total) by mouth every 6 (six) hours. 12 tablet 0   tamoxifen (NOLVADEX) 20 MG tablet TAKE 1 TABLET(20 MG) BY MOUTH DAILY 30 tablet 0   TOPAMAX 25 MG tablet Take 25 mg by mouth daily.     venlafaxine (EFFEXOR) 75 MG tablet Take 1 tablet (75 mg total) by mouth daily. 90 tablet 0   No current facility-administered medications for this visit.    Allergies   Allergies as of 07/20/2022   (No Known Allergies)    Past Medical History   Past Medical History:  Diagnosis Date   Breast cancer (HCC)    Cancer (HCC)    renal cell carcinoma 2007 s/p partial  right nephrectomy    History of kidney cancer    Hypertension    Personal history of chemotherapy    Personal history of radiation therapy    Sleep apnea     Past Surgical History   Past Surgical History:  Procedure Laterality Date   BREAST LUMPECTOMY Right 2019   BREAST SURGERY     CESAREAN SECTION  1994   HYSTEROSCOPY WITH D & C N/A 03/12/2020   Procedure: Hysteroscopy Uterine Curettage;  Surgeon: Lazaro Arms, MD;  Location: [facility];  Service: Gynecology;  Laterality: N/A;   HYSTEROSCOPY WITH D & C N/A 11/11/2020   Procedure: DILATATION AND CURETTAGE /HYSTEROSCOPY;  Surgeon: Myna Hidalgo, DO;  Location: [facility];  Service: Gynecology;  Laterality: N/A;   POLYPECTOMY N/A 11/11/2020   Procedure: Myosure POLYPECTOMY;  Surgeon: Myna Hidalgo, DO;  Location: [facility];  Service: Gynecology;  Laterality: N/A;    Past Family History   Family History  Problem Relation Age of Onset   Cancer Maternal Grandmother        stomach   Cancer Maternal Grandfather        kidney cancer   Other Father        auto accident    Past Social History  Social History   Socioeconomic History   Marital status: Married    Spouse name: Not on file   Number of children: 4   Years of education: Not on file   Highest education level: Not on file  Occupational History   Occupation: makes cookies    Employer: [employer]  Tobacco Use   Smoking status: Never   Smokeless tobacco: Never  Vaping Use   Vaping Use: Never used  Substance and Sexual Activity   Alcohol use: Yes    Alcohol/week: 2.0 standard drinks of alcohol    Types: 2 Cans of beer per week    Comment: 2 beers per day   Drug use: Yes    Types: Marijuana   Sexual activity: Not Currently    Birth control/protection: Post-menopausal  Other Topics Concern   Not on file  Social History Narrative   Not on file   Social Determinants of Health   Financial Resource Strain: High Risk (02/16/2022)   Overall Financial Resource Strain  (CARDIA)    Difficulty of Paying Living Expenses: Hard  Food Insecurity: Food Insecurity Present (02/16/2022)   Hunger Vital Sign    Worried About Running Out of Food in the Last Year: Never true    Ran Out of Food in the Last Year: Sometimes true  Transportation Needs: No Transportation Needs (02/16/2022)   PRAPARE - Administrator, Civil Service (Medical): No    Lack of Transportation (Non-Medical): No  Physical Activity: Inactive (02/16/2022)   Exercise Vital Sign    Days of Exercise per Week: 0 days    Minutes of Exercise per Session: 0 min  Stress: No Stress Concern Present (02/16/2022)   [employer] of Occupational Health - Occupational Stress Questionnaire    Feeling of Stress : Only a little  Social Connections: Moderately Integrated (02/16/2022)   Social Connection and Isolation Panel [NHANES]    Frequency of Communication with Friends and Family: More than three times a week    Frequency of Social Gatherings with Friends and Family: Once a week    Attends Religious Services: 1 to 4 times per year    Active Member of [employer] or Organizations: No    Attends Banker Meetings: Never    Marital Status: Married  Catering manager Violence: Not At Risk (02/16/2022)   Humiliation, Afraid, Rape, and Kick questionnaire    Fear of Current or Ex-Partner: No    Emotionally Abused: No    Physically Abused: No    Sexually Abused: No    Review of Systems   General: Negative for anorexia, weight loss, fever, chills, fatigue, weakness. Eyes: Negative for vision changes.  ENT: Negative for hoarseness, difficulty swallowing , nasal congestion. CV: Negative for chest pain, angina, palpitations, dyspnea on exertion, peripheral edema.  Respiratory: Negative for dyspnea at rest, dyspnea on exertion, cough, sputum, wheezing.  GI: See history of present illness. GU:  Negative for dysuria, hematuria, urinary incontinence, urinary frequency, nocturnal urination.  MS:  Negative for joint pain, low back pain.  Derm: Negative for rash or itching.  Neuro: Negative for weakness, abnormal sensation, seizure, frequent headaches, memory loss,  confusion.  Psych: Negative for anxiety, depression, suicidal ideation, hallucinations.  Endo: Negative for unusual weight change.  Heme: Negative for bruising or bleeding. Allergy: Negative for rash or hives.  Physical Exam   LMP 04/29/2020 (Within Weeks) Comment: Bleeding   General: Well-nourished, well-developed in no acute distress.  Head: Normocephalic, atraumatic.   Eyes: Conjunctiva pink,  no icterus. Mouth: Oropharyngeal mucosa moist and pink , no lesions erythema or exudate. Neck: Supple without thyromegaly, masses, or lymphadenopathy.  Lungs: Clear to auscultation bilaterally.  Heart: Regular rate and rhythm, no murmurs rubs or gallops.  Abdomen: Bowel sounds are normal, nontender, nondistended, no hepatosplenomegaly or masses,  no abdominal bruits or hernia, no rebound or guarding.   Rectal: *** Extremities: No lower extremity edema. No clubbing or deformities.  Neuro: Alert and oriented x 4 , grossly normal neurologically.  Skin: Warm and dry, no rash or jaundice.   Psych: Alert and cooperative, normal mood and affect.  Labs   Lab Results  Component Value Date   LIPASE 37 06/22/2022   Lab Results  Component Value Date   CREATININE 0.63 06/22/2022   BUN 10 06/22/2022   NA 136 06/22/2022   K 3.7 06/22/2022   CL 107 06/22/2022   CO2 24 06/22/2022   Lab Results  Component Value Date   ALT 28 06/22/2022   AST 23 06/22/2022   ALKPHOS 93 06/22/2022   BILITOT 0.5 06/22/2022   Lab Results  Component Value Date   WBC 4.3 06/22/2022   HGB 12.7 06/22/2022   HCT 37.5 06/22/2022   MCV 89.9 06/22/2022   PLT 219 06/22/2022    Imaging Studies   CT ABDOMEN PELVIS W CONTRAST  Result Date: 06/22/2022 CLINICAL DATA:  One month of intermittent abdominal pain and diarrhea. EXAM: CT ABDOMEN AND  PELVIS WITH CONTRAST TECHNIQUE: Multidetector CT imaging of the abdomen and pelvis was performed using the standard protocol following bolus administration of intravenous contrast. RADIATION DOSE REDUCTION: This exam was performed according to the departmental dose-optimization program which includes automated exposure control, adjustment of the mA and/or kV according to patient size and/or use of iterative reconstruction technique. CONTRAST:  OMNIPAQUE IOHEXOL 300 MG/ML  SOLN COMPARISON:  CT abdomen pelvis 10/31/2020 FINDINGS: Lower chest: No acute abnormality. Hepatobiliary: No focal liver abnormality is seen. Mild diffuse hepatic steatosis status post cholecystectomy. No biliary dilatation. Pancreas: Unremarkable. No pancreatic ductal dilatation or surrounding inflammatory changes. Spleen: Normal in size without focal abnormality. Adrenals/Urinary Tract: Adrenal glands are unremarkable. Redemonstrated bilateral renal cysts including a hemorrhagic cyst in the medial right kidney. No new focal liver lesion. No hydronephrosis or renal calculi. Urinary bladder is unremarkable. Stomach/Bowel: Stomach is within normal limits. No evidence of bowel wall thickening, distention, or inflammatory changes. Colonic diverticula without evidence of diverticulitis. Vascular/Lymphatic: No significant vascular findings are present. No enlarged abdominal or pelvic lymph nodes. Reproductive: Uterus and bilateral adnexa are unremarkable. Other: No abdominal wall hernia or abnormality. No abdominopelvic ascites. Musculoskeletal: No acute or significant osseous findings. IMPRESSION: 1. No acute findings in the abdomen or pelvis. 2. Colonic diverticulosis without evidence of diverticulitis. 3. Mild diffuse hepatic steatosis. Electronically Signed   By: Emmaline Kluver M.D.   On: 06/22/2022 13:27    Assessment       PLAN   ***   Leanna Battles. Melvyn Neth, MHS, PA-C [facility]

## 2022-07-20 ENCOUNTER — Ambulatory Visit (INDEPENDENT_AMBULATORY_CARE_PROVIDER_SITE_OTHER): Payer: BC Managed Care – PPO | Admitting: Gastroenterology

## 2022-07-20 ENCOUNTER — Encounter: Payer: Self-pay | Admitting: Gastroenterology

## 2022-07-20 VITALS — BP 148/78 | HR 72 | Temp 97.7°F | Ht 66.0 in | Wt 200.4 lb

## 2022-07-20 DIAGNOSIS — R197 Diarrhea, unspecified: Secondary | ICD-10-CM | POA: Insufficient documentation

## 2022-07-20 DIAGNOSIS — A09 Infectious gastroenteritis and colitis, unspecified: Secondary | ICD-10-CM

## 2022-07-20 DIAGNOSIS — R1033 Periumbilical pain: Secondary | ICD-10-CM

## 2022-07-20 DIAGNOSIS — R140 Abdominal distension (gaseous): Secondary | ICD-10-CM | POA: Diagnosis not present

## 2022-07-20 DIAGNOSIS — K760 Fatty (change of) liver, not elsewhere classified: Secondary | ICD-10-CM | POA: Diagnosis not present

## 2022-07-20 DIAGNOSIS — R109 Unspecified abdominal pain: Secondary | ICD-10-CM | POA: Insufficient documentation

## 2022-07-20 MED ORDER — DICYCLOMINE HCL 10 MG PO CAPS: 10.0000 mg | ORAL_CAPSULE | Freq: Three times a day (TID) | ORAL | 3 refills | Status: DC

## 2022-07-20 NOTE — Patient Instructions (Addendum)
Trial of bentyl 10mg  before meals.  We will request copy of your records from Dr. Eliane Decree office.  Cut back on alcohol, eliminate right now if possible.  Please complete stools and labs. We will be in touch with records.

## 2022-07-21 LAB — SEDIMENTATION RATE: Sed Rate: 2 mm/hr (ref 0–40)

## 2022-07-22 ENCOUNTER — Other Ambulatory Visit: Payer: Self-pay | Admitting: Gastroenterology

## 2022-07-22 DIAGNOSIS — A09 Infectious gastroenteritis and colitis, unspecified: Secondary | ICD-10-CM | POA: Diagnosis not present

## 2022-07-22 DIAGNOSIS — R1033 Periumbilical pain: Secondary | ICD-10-CM | POA: Diagnosis not present

## 2022-07-22 DIAGNOSIS — K760 Fatty (change of) liver, not elsewhere classified: Secondary | ICD-10-CM | POA: Diagnosis not present

## 2022-07-22 DIAGNOSIS — R140 Abdominal distension (gaseous): Secondary | ICD-10-CM | POA: Diagnosis not present

## 2022-07-22 LAB — IGA: IgA/Immunoglobulin A, Serum: 292 mg/dL (ref 87–352)

## 2022-07-22 LAB — TSH+FREE T4
Free T4: 0.94 ng/dL (ref 0.82–1.77)
TSH: 0.762 u[IU]/mL (ref 0.450–4.500)

## 2022-07-22 LAB — C-REACTIVE PROTEIN: CRP: <1 mg/L (ref 0–10)

## 2022-07-22 LAB — TISSUE TRANSGLUTAMINASE, IGA: Transglutaminase IgA: <2 U/mL (ref 0–3)

## 2022-07-24 LAB — GI PROFILE, STOOL, PCR

## 2022-07-29 LAB — PANCREATIC ELASTASE, FECAL: Pancreatic Elastase, Fecal: 238 ug Elast./g (ref >200)

## 2022-08-11 ENCOUNTER — Encounter: Payer: Self-pay | Admitting: Family Medicine

## 2022-08-11 ENCOUNTER — Other Ambulatory Visit: Payer: BC Managed Care – PPO

## 2022-08-11 ENCOUNTER — Ambulatory Visit (INDEPENDENT_AMBULATORY_CARE_PROVIDER_SITE_OTHER): Payer: BC Managed Care – PPO | Admitting: Family Medicine

## 2022-08-11 VITALS — BP 135/83 | HR 77 | Ht 66.0 in | Wt 200.1 lb

## 2022-08-11 DIAGNOSIS — I10 Essential (primary) hypertension: Secondary | ICD-10-CM | POA: Diagnosis not present

## 2022-08-11 DIAGNOSIS — E559 Vitamin D deficiency, unspecified: Secondary | ICD-10-CM | POA: Diagnosis not present

## 2022-08-11 DIAGNOSIS — Z1211 Encounter for screening for malignant neoplasm of colon: Secondary | ICD-10-CM | POA: Diagnosis not present

## 2022-08-11 DIAGNOSIS — Z13220 Encounter for screening for lipoid disorders: Secondary | ICD-10-CM | POA: Diagnosis not present

## 2022-08-11 DIAGNOSIS — Z131 Encounter for screening for diabetes mellitus: Secondary | ICD-10-CM

## 2022-08-11 MED ORDER — VENLAFAXINE HCL 75 MG PO TABS: 75.0000 mg | ORAL_TABLET | Freq: Every day | ORAL | 1 refills | Status: DC

## 2022-08-11 MED ORDER — TOPAMAX 25 MG PO TABS: 25.0000 mg | ORAL_TABLET | Freq: Every day | ORAL | 3 refills | Status: DC

## 2022-08-11 MED ORDER — FAMOTIDINE 20 MG PO TABS
20.0000 mg | ORAL_TABLET | Freq: Two times a day (BID) | ORAL | 3 refills | Status: DC
Start: 2022-08-11 — End: 2022-11-02

## 2022-08-11 MED ORDER — AMLODIPINE BESYLATE 5 MG PO TABS: 5.0000 mg | ORAL_TABLET | Freq: Every day | ORAL | 1 refills | Status: DC

## 2022-08-11 MED ORDER — GABAPENTIN 300 MG PO CAPS: 300.0000 mg | ORAL_CAPSULE | Freq: Three times a day (TID) | ORAL | 3 refills | Status: DC | PRN

## 2022-08-11 MED ORDER — BENAZEPRIL HCL 10 MG PO TABS: 10.0000 mg | ORAL_TABLET | Freq: Every day | ORAL | 3 refills | Status: DC

## 2022-08-11 NOTE — Progress Notes (Signed)
New Patient Office Visit   Subjective   Patient ID: Jennifer Harvey, female    DOB: November 17, 1960  Age: 62 y.o. MRN: [id number]  CC:  Chief Complaint  Patient presents with   New Patient (Initial Visit)    Establish care    HPI Jennifer Harvey 62 year old female, presents to establish care. She  has a past medical history of Breast cancer (HCC), Cancer (HCC), History of kidney cancer, Hypertension, Personal history of chemotherapy, Personal history of radiation therapy, and Sleep apnea.  Patient here for elevated blood pressure. She is exercising and is adherent to low salt diet.  Blood pressure is well controlled at home.  Patient denies Cardiac symptoms chest pain, chest pressure/discomfort, dyspnea, lower extremity edema, palpitations, syncope, and tachypnea. Cardiovascular risk factors: hypertension and obesity (BMI >= 30 kg/m2).       Outpatient Encounter Medications as of 08/11/2022  Medication Sig   benazepril (LOTENSIN) 10 MG tablet Take 1 tablet (10 mg total) by mouth daily.   dicyclomine (BENTYL) 10 MG capsule Take 1 capsule (10 mg total) by mouth 3 (three) times daily before meals.   famotidine (PEPCID) 20 MG tablet Take 1 tablet (20 mg total) by mouth 2 (two) times daily.   gabapentin (NEURONTIN) 300 MG capsule Take 1 capsule (300 mg total) by mouth 3 (three) times daily as needed.   Multiple Vitamin (MULTIVITAMIN) tablet Take 1 tablet by mouth daily.   tamoxifen (NOLVADEX) 20 MG tablet TAKE 1 TABLET(20 MG) BY MOUTH DAILY   [DISCONTINUED] amLODipine (NORVASC) 5 MG tablet Take 5 mg by mouth daily.    [DISCONTINUED] citalopram (CELEXA) 40 MG tablet Take 40 mg by mouth daily.   [DISCONTINUED] omeprazole (PRILOSEC) 40 MG capsule Take 1 capsule (40 mg total) by mouth daily.   [DISCONTINUED] TOPAMAX 25 MG tablet Take 25 mg by mouth daily.   [DISCONTINUED] venlafaxine (EFFEXOR) 75 MG tablet Take 1 tablet (75 mg total) by mouth daily.   amLODipine (NORVASC) 5 MG tablet  Take 1 tablet (5 mg total) by mouth daily.   TOPAMAX 25 MG tablet Take 1 tablet (25 mg total) by mouth daily.   venlafaxine (EFFEXOR) 75 MG tablet Take 1 tablet (75 mg total) by mouth daily.   [DISCONTINUED] benazepril-hydrochlorthiazide (LOTENSIN HCT) 5-6.25 MG tablet Take 1 tablet by mouth daily. (Patient not taking: Reported on 07/20/2022)   No facility-administered encounter medications on file as of 08/11/2022.    Past Surgical History:  Procedure Laterality Date   BREAST LUMPECTOMY Right 2019   BREAST SURGERY     CESAREAN SECTION  1994   HYSTEROSCOPY WITH D & C N/A 03/12/2020   Procedure: Hysteroscopy Uterine Curettage;  Surgeon: Lazaro Arms, MD;  Location: [facility];  Service: Gynecology;  Laterality: N/A;   HYSTEROSCOPY WITH D & C N/A 11/11/2020   Procedure: DILATATION AND CURETTAGE /HYSTEROSCOPY;  Surgeon: Myna Hidalgo, DO;  Location: [facility];  Service: Gynecology;  Laterality: N/A;   partial right nephrectomy  2007   POLYPECTOMY N/A 11/11/2020   Procedure: Myosure POLYPECTOMY;  Surgeon: Myna Hidalgo, DO;  Location: [facility];  Service: Gynecology;  Laterality: N/A;    Review of Systems  Constitutional:  Negative for fever.  Eyes:  Negative for blurred vision.  Respiratory:  Negative for shortness of breath.   Cardiovascular:  Negative for chest pain and palpitations.  Gastrointestinal:  Negative for abdominal pain.  Genitourinary:  Negative for dysuria.  Musculoskeletal:  Negative for back pain.  Neurological:  Negative for dizziness and headaches.      Objective    BP 135/83   Pulse 77   Ht 5\' 6"  (1.676 m)   Wt 200 lb 1.3 oz (90.8 kg)   LMP 04/29/2020 (Within Weeks) Comment: Bleeding  SpO2 93%   BMI 32.29 kg/m   Physical Exam Vitals reviewed.  Constitutional:      General: She is not in acute distress.    Appearance: Normal appearance. She is not ill-appearing, toxic-appearing or diaphoretic.  HENT:     Head: Normocephalic.  Eyes:     General:        Right  eye: No discharge.        Left eye: No discharge.     Conjunctiva/sclera: Conjunctivae normal.  Cardiovascular:     Rate and Rhythm: Normal rate.     Pulses: Normal pulses.     Heart sounds: Normal heart sounds.  Pulmonary:     Effort: Pulmonary effort is normal. No respiratory distress.     Breath sounds: Normal breath sounds.  Abdominal:     General: Bowel sounds are normal.     Palpations: Abdomen is soft.     Tenderness: There is no abdominal tenderness. There is no right CVA tenderness, left CVA tenderness or guarding.  Musculoskeletal:        General: Normal range of motion.     Cervical back: Normal range of motion.  Skin:    General: Skin is warm and dry.     Capillary Refill: Capillary refill takes less than 2 seconds.  Neurological:     General: No focal deficit present.     Mental Status: She is alert and oriented to person, place, and time.     Coordination: Coordination normal.     Gait: Gait normal.  Psychiatric:        Mood and Affect: Mood normal.       Assessment & Plan:  Screening for colon cancer -     Ambulatory referral to Gastroenterology  Primary hypertension Assessment & Plan: Vitals:   08/11/22 0831 08/11/22 0925  BP: (!) 143/83 135/83   Patient reported does not have blood pressure medications and needs a refilled Refilled Amlodipine 5 mg and Benzaepril 10 mg daily Advise on the importance of medication compliance Continued discussion on DASH diet, low sodium diet and maintain a exercise routine for 150 minutes per week.   Orders: -     Microalbumin / creatinine urine ratio -     CMP14+EGFR -     CBC with Differential/Platelet  Vitamin D deficiency -     VITAMIN D 25 Hydroxy (Vit-D Deficiency, Fractures)  Screening for lipid disorders -     Lipid panel  Screening for diabetes mellitus -     Hemoglobin A1c  Other orders -     amLODIPine Besylate; Take 1 tablet (5 mg total) by mouth daily.  Dispense: 90 tablet; Refill: 1 -      Famotidine; Take 1 tablet (20 mg total) by mouth 2 (two) times daily.  Dispense: 90 tablet; Refill: 3 -     Venlafaxine HCl; Take 1 tablet (75 mg total) by mouth daily.  Dispense: 90 tablet; Refill: 1 -     Topamax; Take 1 tablet (25 mg total) by mouth daily.  Dispense: 30 tablet; Refill: 3 -     Benazepril HCl; Take 1 tablet (10 mg total) by mouth daily.  Dispense: 90 tablet; Refill: 3 -     Gabapentin;  Take 1 capsule (300 mg total) by mouth 3 (three) times daily as needed.  Dispense: 90 capsule; Refill: 3    Return in about 3 months (around 11/11/2022), or if symptoms worsen or fail to improve, for chronic follow-up, routine labs.   Cruzita Lederer Newman Nip, FNP

## 2022-08-11 NOTE — Assessment & Plan Note (Signed)
Vitals:   08/11/22 0831 08/11/22 0925  BP: (!) 143/83 135/83   Patient reported does not have blood pressure medications and needs a refilled Refilled Amlodipine 5 mg and Benzaepril 10 mg daily Advise on the importance of medication compliance Continued discussion on DASH diet, low sodium diet and maintain a exercise routine for 150 minutes per week.

## 2022-08-11 NOTE — Patient Instructions (Signed)

## 2022-08-12 ENCOUNTER — Telehealth: Payer: Self-pay | Admitting: *Deleted

## 2022-08-12 ENCOUNTER — Inpatient Hospital Stay: Payer: BC Managed Care – PPO | Attending: Hematology

## 2022-08-12 LAB — CBC WITH DIFFERENTIAL/PLATELET
Basos: 1 %
Hematocrit: 40.3 % (ref 34.0–46.6)
Immature Granulocytes: 0 %
MCHC: 33.3 g/dL (ref 31.5–35.7)
WBC: 4.1 10*3/uL (ref 3.4–10.8)

## 2022-08-12 LAB — MICROALBUMIN / CREATININE URINE RATIO

## 2022-08-12 LAB — LIPID PANEL
Chol/HDL Ratio: 4.3 ratio (ref 0.0–4.4)
Cholesterol, Total: 168 mg/dL (ref 100–199)
HDL: 39 mg/dL — ABNORMAL LOW (ref >39)
VLDL Cholesterol Cal: 47 mg/dL — ABNORMAL HIGH (ref 5–40)

## 2022-08-12 LAB — HEMOGLOBIN A1C: Est. average glucose Bld gHb Est-mCnc: 123 mg/dL

## 2022-08-12 LAB — CMP14+EGFR
Alkaline Phosphatase: 117 IU/L (ref 44–121)
BUN/Creatinine Ratio: 11 (ref 9–23)
Globulin, Total: 2.3 g/dL (ref 1.5–4.5)
Glucose: 109 mg/dL — ABNORMAL HIGH (ref 70–99)
Sodium: 143 mmol/L (ref 134–144)

## 2022-08-12 LAB — VITAMIN D 25 HYDROXY (VIT D DEFICIENCY, FRACTURES): Vit D, 25-Hydroxy: 26.9 ng/mL — ABNORMAL LOW (ref 30.0–100.0)

## 2022-08-12 NOTE — Telephone Encounter (Signed)
Thanks, I will make a note on referral and close it

## 2022-08-12 NOTE — Telephone Encounter (Signed)
No need to send questionnaire. I will address. Thanks.

## 2022-08-12 NOTE — Telephone Encounter (Signed)
Rec'd referral from [facility] for screening TCS - I see you saw patient on 07/20/22 for Postprandial abdominal pain/bloating/diarrhea  nd asked for labs, stool studies and previous GI records (scanned under media) - do I need to send TCS questionnaire or were you waiting on these before deciding if patient needed TCS - please advise. thanks

## 2022-08-14 LAB — CMP14+EGFR
ALT: 28 IU/L (ref 0–32)
AST: 25 IU/L (ref 0–40)
Albumin/Globulin Ratio: 2 (ref 1.2–2.2)
Albumin: 4.5 g/dL (ref 3.8–4.9)
BUN: 8 mg/dL (ref 6–24)
Bilirubin Total: <0.2 mg/dL (ref 0.0–1.2)
CO2: 25 mmol/L (ref 20–29)
Calcium: 9.3 mg/dL (ref 8.7–10.2)
Chloride: 103 mmol/L (ref 96–106)
Creatinine, Ser: 0.72 mg/dL (ref 0.57–1.00)
Potassium: 4.1 mmol/L (ref 3.5–5.2)
Total Protein: 6.8 g/dL (ref 6.0–8.5)
eGFR: 96 mL/min/{1.73_m2} (ref >59)

## 2022-08-14 LAB — CBC WITH DIFFERENTIAL/PLATELET
Basophils Absolute: 0.1 10*3/uL (ref 0.0–0.2)
EOS (ABSOLUTE): 0.1 10*3/uL (ref 0.0–0.4)
Eos: 3 %
Hemoglobin: 13.4 g/dL (ref 11.1–15.9)
Immature Grans (Abs): 0 10*3/uL (ref 0.0–0.1)
Lymphocytes Absolute: 1.8 10*3/uL (ref 0.7–3.1)
Lymphs: 45 %
MCH: 30.2 pg (ref 26.6–33.0)
MCV: 91 fL (ref 79–97)
Monocytes Absolute: 0.4 10*3/uL (ref 0.1–0.9)
Monocytes: 10 %
Neutrophils Absolute: 1.7 10*3/uL (ref 1.4–7.0)
Neutrophils: 41 %
Platelets: 239 10*3/uL (ref 150–450)
RBC: 4.44 x10E6/uL (ref 3.77–5.28)
RDW: 13.6 % (ref 11.7–15.4)

## 2022-08-14 LAB — LIPID PANEL
LDL Chol Calc (NIH): 82 mg/dL (ref 0–99)
Triglycerides: 285 mg/dL — ABNORMAL HIGH (ref 0–149)

## 2022-08-14 LAB — HEMOGLOBIN A1C: Hgb A1c MFr Bld: 5.9 % — ABNORMAL HIGH (ref 4.8–5.6)

## 2022-08-15 NOTE — Telephone Encounter (Signed)
Reviewed records from [facility].   Abd U/S 01/2022: gallbladder absent. Hepatic steatosis. Simple and minimally complex bilateral renal cysts  EGD 11/2021:: normal mucosa in esophagus s/p dilation. Esophageal biopsy with chronic inflammation of gastric-type mucosa. Neg for Barrett's. Mild reflux changes. Mild to moderate erythema in antrum and body. Mild chronic gastritis. Neg for h.pylori  Flex sig 11/2021: normal mucosa in distal trv colon, desc colon, sigm colon, rectum. Hyperplastic appearing polyps 2-58mm in rectosigmoid junction focal glandular hypertrophy and lymphoid aggregate. No adenomatous changes.   Colonoscopy 05/2020: moderate diverticulosis sigmoid colon. Tortious redundant colon with fair prep. Tubular adenoma removed. Repeat colonoscopy in 2025.    Please let pt know that I have reviewed her GI records from Dr. Allena Katz.   At this time, no indication for repeat EGD, likely would be low yield given two completed since 2021 with the last on in 11/2021.   I would offer her colonoscopy for diarrhea, change in bowels. Her last colonoscopy was in 2022 and prep was fair with plans to repeat in 2025 due to polyps. She had flex sig in 2023 but no random biopsies.  --->If agreeable, schedule colonoscopy with random colon biopsies with Dr. Marletta Lor. ASA 2. Will need bmet. Dx: change in bowels, diarrhea.  -Hold bentyl or other antidiarrhea medication the week before prep.

## 2022-08-16 NOTE — Telephone Encounter (Signed)
Pt was made aware and verbalized understanding. Pt would like to move forward with scheduling the recommended colonoscopy.

## 2022-08-17 ENCOUNTER — Encounter: Payer: Self-pay | Admitting: *Deleted

## 2022-08-17 MED ORDER — PEG 3350-KCL-NA BICARB-NACL 420 G PO SOLR: 4000.0000 mL | Freq: Once | ORAL | 0 refills | Status: AC

## 2022-08-17 NOTE — Telephone Encounter (Signed)
Spoke with pt. Scheduled for 618. Aware will send rx to pharmacy. Instructions sent via mychart. Already has recent labs within 30 days.

## 2022-08-17 NOTE — Addendum Note (Signed)
Addended by: Armstead Peaks on: 08/17/2022 08:52 AM   Modules accepted: Orders

## 2022-08-18 ENCOUNTER — Ambulatory Visit: Payer: BC Managed Care – PPO | Admitting: Hematology

## 2022-08-18 NOTE — Progress Notes (Signed)
[facility] [address] [place] [address]    Clinic Day:  08/19/2022  Referring physician: Lenoria Chime, FNP  Patient Care Team: Rica Records, FNP as PCP - General (Family Medicine) Doreatha Massed, MD as Medical Oncologist (Medical Oncology)   ASSESSMENT & PLAN:   Assessment: 1. Stage I (T1CN0) right breast IDC, ER 3+, PR 3+, HER2 negative: - 08/03/2017: Right breast UIQ biopsy: Invasive lobular carcinoma, ER 100%, PR 20%, Ki-67 20%, HER2 negative. - Lumpectomy was done in [place].  1.7 cm PT1CN0 infiltrating ductal carcinoma ER 3+, PR 3+, HER2 negative. - Status post XRT in [place]. - Tamoxifen started in December 2019.  LMP was in October 2019.   2. Social/family history: - She lives at home with her husband.  She sits with elderly people, works for [employer]. - Maternal grandmother had stomach cancer.  Maternal uncle had lung cancer.    Plan: 1.  Stage I (T1c N0) right breast IDC, ER 3+, PR 3+, HER2 negative: - Mammogram on 02/03/2022, BI-RADS Category 1. - She tolerates tamoxifen well except some hot flashes.  She also reported memory problems, mostly with recent memory. - She also reports tinnitus in the ears since tamoxifen started. - Reviewed labs today: Normal LFTs.  CBC grossly normal. - Recommend neurology consultation for memory problems. - We discussed BCI test results: No benefit from extended antiestrogen therapy.  Late distant rate of 5.7% with or without extended antiestrogen therapy. - Hence I have recommended that she continue tamoxifen for 5 years until December 2024 and stop it. - Will schedule her for mammogram in November.  RTC 6 months for follow-up with repeat labs.   2.  Right kidney cancer: - Diagnosed in 2007, status post right partial nephrectomy. - CTAP on 06/22/2022: Bilateral renal cysts including hemorrhagic cyst in the medial right kidney.  Mild diffuse hepatic steatosis.   3.   Vasomotor symptoms: - Continue Effexor 75 mg daily.  Hot flashes improved but still present.   4.  Bone health: - DEXA scan on 02/03/2022: T-score -0.3. - Latest vitamin D level is 26.9. - Recommend increasing vitamin D intake.    Orders Placed This Encounter  Procedures   MM 3D SCREENING MAMMOGRAM BILATERAL BREAST    Nas No implants NMD-[facility]    Standing Status:   Future    Standing Expiration Date:   08/19/2023    Order Specific Question:   Reason for Exam (SYMPTOM  OR DIAGNOSIS REQUIRED)    Answer:   breast cancer screening    Order Specific Question:   Is the patient pregnant?    Answer:   No    Order Specific Question:   Preferred imaging location?    Answer:   [facility]   VITAMIN D 25 Hydroxy (Vit-D Deficiency, Fractures)    Standing Status:   Future    Standing Expiration Date:   08/19/2023   CBC with Differential    Standing Status:   Future    Standing Expiration Date:   08/19/2023   Comprehensive metabolic panel    Standing Status:   Future    Standing Expiration Date:   08/19/2023      I,Katie Daubenspeck,acting as a scribe for Doreatha Massed, MD.,have documented all relevant documentation on the behalf of Doreatha Massed, MD,as directed by  Doreatha Massed, MD while in the presence of Doreatha Massed, MD.   I, Doreatha Massed MD, have reviewed the above documentation for  accuracy and completeness, and I agree with the above.   Doreatha Massed, MD   6/6/20245:38 PM  CHIEF COMPLAINT:   Diagnosis: right breast cancer    Cancer Staging  Malignant neoplasm of upper-inner quadrant of breast in female, estrogen receptor positive (HCC) Staging form: Breast, AJCC 8th Edition - Clinical stage from 08/19/2017: cT1c, cN0, cM0, ER+, PR+, HER2- - Signed by Pollyann Samples, NP on 08/19/2017 - Pathologic: No stage assigned - Unsigned    Prior Therapy: 1. Right partial nephrectomy in 2007 for kidney cancer 2.  Right breast lumpectomy in June  2019 3.  Radiation therapy done in [place]  Current Therapy:  Tamoxifen    HISTORY OF PRESENT ILLNESS:   Oncology History  Malignant neoplasm of upper-inner quadrant of breast in female, estrogen receptor positive (HCC)  06/07/2017 Mammogram   BILAT SCREENING MAMMOGRAM  There is an irregular focal asymmetry or spiculated mass in the upper inner right breast middle third, located 7-8 cm from the nipple with a single punctate calcification posteriorly.  Additionally there is a small irregular mass or focal asymmetry more anteriorly in the inner central right breast, 5 cm from the nipple.  Between these 2 masses there is a stable grouping of calcifications.  Left breast is negative   06/20/2017 Mammogram   Right unilateral diagnostic mammogram: Redemonstrated irregular masslike focal asymmetry with architectural distortion in the upper inner breast with an associated punctate calcification measuring 1.1 cm.  Anterior to this is a more subtle area of focal architectural distortion.  Overall the area spans approximately 4 cm   06/20/2017 Breast US   Targeted ultrasound of the right breast reveals at 2 o'clock position 3-4 cm from the nipple there is a subtle irregular hypoechoic area with shadowing measuring 8 mm.  Incidentally noted at 3 o'clock position 6 cm from the nipple is an oval well-circumscribed hypoechoic mass measuring 1.6 x 0.8 x 1.1 cm   06/30/2017 Initial Biopsy   [place] needle core biopsy:  Diagnosis: Fibrotic tissue with increased adenosis, no tumor seen. Immunostains returned.  All glandular elements in the core biopsies are positive for CK 5/6, high molecular weight cytokeratin and E-Cadherin.  There is no focal increase in proliferation with a Ki-67 stain.  Both p63 and smooth muscle myosin exhibit intact basilar and myoepithelial layers surrounding all glandular elements in the core biopsy sections.  These findings support a benign diagnosis.   07/12/2017 Mammogram   Diagnostic  mammogram, post biopsy clip imaging: Biopsy clip is approximately 1.3 cm medial to the suspicious mass which was biopsied on [place]. There is a persistent asymmetry at the site of concern in the medial right breast middle depth. Further biopsy was recommended    08/03/2017 Pathology Results   Diagnosis 1. Breast, right, needle core biopsy, UIQ - INVASIVE MAMMARY CARCINOMA. - MAMMARY CARCINOMA IN SITU. - SEE MICROSCOPIC DESCRIPTION. 2. Breast, right, needle core biopsy, UIQ - INVASIVE MAMMARY CARCINOMA. - MAMMARY CARCINOMA IN SITU. - SEE MICROSCOPIC DESCRIPTION.  Microscopic Comment 1. There is invasive mammary carcinoma which has features suggestive of invasive lobular carcinoma. There is also a separate component which may represent an invasive ductal component. E-Cadherin immunohistochemistry will be performed as well as prognostic profile. 2. E-Cadherin and breast prognostic profile will be performed.  ADDENDUM: 1,2. Immunohistochemistry for E-Cadherin shows an invasive and in situ component that is E-Cadherin negative consistent with lobular carcinoma. There is also a separate morphologic component which is strongly E-Cadherin positive consistent with invasive and in situ  ductal carcinoma. Basal cell markers for p63, calponin and smooth muscle myosin are negative in the invasive component supporting the diagnosis. (JDP:kh 08-04-17)  1. PROGNOSTIC INDICATORS Results: IMMUNOHISTOCHEMICAL AND MORPHOMETRIC ANALYSIS PERFORMED MANUALLY Estrogen Receptor: 100%, POSITIVE, STRONG STAINING INTENSITY Progesterone Receptor: 10%, POSITIVE, STRONG STAINING INTENSITY Proliferation Marker Ki67: 20%  1. FLUORESCENCE IN-SITU HYBRIDIZATION Results: HER2 - NEGATIVE RATIO OF HER2/CEP17 SIGNALS 1.41 AVERAGE HER2 COPY NUMBER PER CELL 1.90  2. PROGNOSTIC INDICATORS Results: IMMUNOHISTOCHEMICAL AND MORPHOMETRIC ANALYSIS PERFORMED MANUALLY Estrogen Receptor: 100%, POSITIVE, STRONG STAINING  INTENSITY Progesterone Receptor: 20%, POSITIVE, STRONG STAINING INTENSITY Proliferation Marker Ki67: 20%  2. FLUORESCENCE IN-SITU HYBRIDIZATION Results: HER2 - NEGATIVE RATIO OF HER2/CEP17 SIGNALS 1.25 AVERAGE HER2 COPY NUMBER PER CELL 2.00   08/19/2017 Initial Diagnosis   Malignant neoplasm of upper-inner quadrant of breast in female, estrogen receptor positive (HCC)   08/19/2017 Cancer Staging   Staging form: Breast, AJCC 8th Edition - Clinical stage from 08/19/2017: cT1c, cN0, cM0, ER+, PR+, HER2- - Signed by Pollyann Samples, NP on 08/19/2017      INTERVAL HISTORY:   Jennifer Harvey is a 62 y.o. female presenting to clinic today for follow up of right breast cancer. She was last seen by me on 02/10/22.  Today, she states that she is doing well overall. Her appetite level is at 100%. Her energy level is at 80%.  PAST MEDICAL HISTORY:   Past Medical History: Past Medical History:  Diagnosis Date   Breast cancer (HCC)    Cancer (HCC)    renal cell carcinoma 2007 s/p partial right nephrectomy    History of kidney cancer    Hypertension    Personal history of chemotherapy    Personal history of radiation therapy    Sleep apnea     Surgical History: Past Surgical History:  Procedure Laterality Date   BREAST LUMPECTOMY Right 2019   BREAST SURGERY     CESAREAN SECTION  1994   HYSTEROSCOPY WITH D & C N/A 03/12/2020   Procedure: Hysteroscopy Uterine Curettage;  Surgeon: Lazaro Arms, MD;  Location: [facility];  Service: Gynecology;  Laterality: N/A;   HYSTEROSCOPY WITH D & C N/A 11/11/2020   Procedure: DILATATION AND CURETTAGE /HYSTEROSCOPY;  Surgeon: Myna Hidalgo, DO;  Location: [facility];  Service: Gynecology;  Laterality: N/A;   partial right nephrectomy  2007   POLYPECTOMY N/A 11/11/2020   Procedure: Myosure POLYPECTOMY;  Surgeon: Myna Hidalgo, DO;  Location: [facility];  Service: Gynecology;  Laterality: N/A;    Social History: Social History   Socioeconomic History   Marital  status: Married    Spouse name: Not on file   Number of children: 4   Years of education: Not on file   Highest education level: Not on file  Occupational History   Occupation: makes cookies    Employer: [employer]  Tobacco Use   Smoking status: Never   Smokeless tobacco: Never  Vaping Use   Vaping Use: Never used  Substance and Sexual Activity   Alcohol use: Yes    Alcohol/week: 2.0 standard drinks of alcohol    Types: 2 Cans of beer per week    Comment: 2 beers per day   Drug use: Yes    Types: Marijuana   Sexual activity: Not Currently    Birth control/protection: Post-menopausal  Other Topics Concern   Not on file  Social History Narrative   Not on file   Social Determinants of Health   Financial Resource Strain: High Risk (02/16/2022)  Overall Financial Resource Strain (CARDIA)    Difficulty of Paying Living Expenses: Hard  Food Insecurity: Food Insecurity Present (02/16/2022)   Hunger Vital Sign    Worried About Running Out of Food in the Last Year: Never true    Ran Out of Food in the Last Year: Sometimes true  Transportation Needs: No Transportation Needs (02/16/2022)   PRAPARE - Administrator, Civil Service (Medical): No    Lack of Transportation (Non-Medical): No  Physical Activity: Inactive (02/16/2022)   Exercise Vital Sign    Days of Exercise per Week: 0 days    Minutes of Exercise per Session: 0 min  Stress: No Stress Concern Present (02/16/2022)   [employer] of Occupational Health - Occupational Stress Questionnaire    Feeling of Stress : Only a little  Social Connections: Moderately Integrated (02/16/2022)   Social Connection and Isolation Panel [NHANES]    Frequency of Communication with Friends and Family: More than three times a week    Frequency of Social Gatherings with Friends and Family: Once a week    Attends Religious Services: 1 to 4 times per year    Active Member of [employer] or Organizations: No    Attends Banker  Meetings: Never    Marital Status: Married  Catering manager Violence: Not At Risk (02/16/2022)   Humiliation, Afraid, Rape, and Kick questionnaire    Fear of Current or Ex-Partner: No    Emotionally Abused: No    Physically Abused: No    Sexually Abused: No    Family History: Family History  Problem Relation Age of Onset   Other Father        auto accident   Cancer Maternal Grandmother        stomach   Cancer Maternal Grandfather        kidney cancer   Colon cancer Neg Hx     Current Medications:  Current Outpatient Medications:    amLODipine (NORVASC) 5 MG tablet, Take 1 tablet (5 mg total) by mouth daily., Disp: 90 tablet, Rfl: 1   benazepril (LOTENSIN) 10 MG tablet, Take 1 tablet (10 mg total) by mouth daily., Disp: 90 tablet, Rfl: 3   dicyclomine (BENTYL) 10 MG capsule, Take 1 capsule (10 mg total) by mouth 3 (three) times daily before meals., Disp: 90 capsule, Rfl: 3   famotidine (PEPCID) 20 MG tablet, Take 1 tablet (20 mg total) by mouth 2 (two) times daily., Disp: 90 tablet, Rfl: 3   gabapentin (NEURONTIN) 300 MG capsule, Take 1 capsule (300 mg total) by mouth 3 (three) times daily as needed., Disp: 90 capsule, Rfl: 3   Multiple Vitamin (MULTIVITAMIN) tablet, Take 1 tablet by mouth daily., Disp: , Rfl:    tamoxifen (NOLVADEX) 20 MG tablet, TAKE 1 TABLET(20 MG) BY MOUTH DAILY, Disp: 30 tablet, Rfl: 0   TOPAMAX 25 MG tablet, Take 1 tablet (25 mg total) by mouth daily., Disp: 30 tablet, Rfl: 3   venlafaxine (EFFEXOR) 75 MG tablet, Take 1 tablet (75 mg total) by mouth daily., Disp: 90 tablet, Rfl: 1   Allergies: No Known Allergies  REVIEW OF SYSTEMS:   Review of Systems  Constitutional:  Negative for chills, fatigue and fever.  HENT:   Negative for lump/mass, mouth sores, nosebleeds, sore throat and trouble swallowing.   Eyes:  Negative for eye problems.  Respiratory:  Positive for shortness of breath. Negative for cough.   Cardiovascular:  Negative for chest pain, leg  swelling and palpitations.  Gastrointestinal:  Negative for abdominal pain, constipation, diarrhea, nausea and vomiting.  Genitourinary:  Negative for bladder incontinence, difficulty urinating, dysuria, frequency, hematuria and nocturia.   Musculoskeletal:  Negative for arthralgias, back pain, flank pain, myalgias and neck pain.  Skin:  Negative for itching and rash.  Neurological:  Negative for dizziness, headaches and numbness.  Hematological:  Does not bruise/bleed easily.  Psychiatric/Behavioral:  Positive for sleep disturbance. Negative for depression and suicidal ideas. The patient is not nervous/anxious.   All other systems reviewed and are negative.    VITALS:   Blood pressure 131/84, pulse 79, temperature (!) 97.4 F (36.3 C), temperature source Tympanic, resp. rate 18, weight 200 lb 9.6 oz (91 kg), last menstrual period 04/29/2020, SpO2 93 %.  Wt Readings from Last 3 Encounters:  08/19/22 200 lb 9.6 oz (91 kg)  08/11/22 200 lb 1.3 oz (90.8 kg)  07/20/22 200 lb 6.4 oz (90.9 kg)    Body mass index is 32.38 kg/m.  Performance status (ECOG): 1 - Symptomatic but completely ambulatory  PHYSICAL EXAM:   Physical Exam Vitals and nursing note reviewed. Exam conducted with a chaperone present.  Constitutional:      Appearance: Normal appearance.  Cardiovascular:     Rate and Rhythm: Normal rate and regular rhythm.     Pulses: Normal pulses.     Heart sounds: Normal heart sounds.  Pulmonary:     Effort: Pulmonary effort is normal.     Breath sounds: Normal breath sounds.  Abdominal:     Palpations: Abdomen is soft. There is no hepatomegaly, splenomegaly or mass.     Tenderness: There is no abdominal tenderness.  Musculoskeletal:     Right lower leg: No edema.     Left lower leg: No edema.  Lymphadenopathy:     Cervical: No cervical adenopathy.     Right cervical: No superficial, deep or posterior cervical adenopathy.    Left cervical: No superficial, deep or posterior  cervical adenopathy.     Upper Body:     Right upper body: No supraclavicular or axillary adenopathy.     Left upper body: No supraclavicular or axillary adenopathy.  Neurological:     General: No focal deficit present.     Mental Status: She is alert and oriented to person, place, and time.  Psychiatric:        Mood and Affect: Mood normal.        Behavior: Behavior normal.     LABS:      Latest Ref Rng & Units 08/11/2022    9:28 AM 06/22/2022   11:25 AM 02/03/2022   10:52 AM  CBC  WBC 3.4 - 10.8 x10E3/uL 4.1  4.3  4.2   Hemoglobin 11.1 - 15.9 g/dL 16.1  09.6  04.5   Hematocrit 34.0 - 46.6 % 40.3  37.5  36.8   Platelets 150 - 450 x10E3/uL 239  219  218       Latest Ref Rng & Units 08/11/2022    9:28 AM 06/22/2022   11:25 AM 02/03/2022   10:52 AM  CMP  Glucose 70 - 99 mg/dL 409  811  914   BUN 6 - 24 mg/dL 8  10  13    Creatinine 0.57 - 1.00 mg/dL 7.82  9.56  2.13   Sodium 134 - 144 mmol/L 143  136  139   Potassium 3.5 - 5.2 mmol/L 4.1  3.7  3.7   Chloride 96 - 106 mmol/L 103  107  110  CO2 20 - 29 mmol/L 25  24  23    Calcium 8.7 - 10.2 mg/dL 9.3  8.8  8.5   Total Protein 6.0 - 8.5 g/dL 6.8  6.8  6.9   Total Bilirubin 0.0 - 1.2 mg/dL <1.6  0.5  0.5   Alkaline Phos 44 - 121 IU/L 117  93  92   AST 0 - 40 IU/L 25  23  24    ALT 0 - 32 IU/L 28  28  26       No results found for: "CEA1", "CEA" / No results found for: "CEA1", "CEA" No results found for: "PSA1" No results found for: "XWR604" No results found for: "CAN125"  No results found for: "TOTALPROTELP", "ALBUMINELP", "A1GS", "A2GS", "BETS", "BETA2SER", "GAMS", "MSPIKE", "SPEI" No results found for: "TIBC", "FERRITIN", "IRONPCTSAT" No results found for: "LDH"   STUDIES:   No results found.

## 2022-08-19 ENCOUNTER — Inpatient Hospital Stay: Payer: BC Managed Care – PPO | Attending: Hematology | Admitting: Hematology

## 2022-08-19 VITALS — BP 131/84 | HR 79 | Temp 97.4°F | Resp 18 | Wt 200.6 lb

## 2022-08-19 DIAGNOSIS — Z801 Family history of malignant neoplasm of trachea, bronchus and lung: Secondary | ICD-10-CM | POA: Insufficient documentation

## 2022-08-19 DIAGNOSIS — Z800 Family history of malignant neoplasm of digestive organs: Secondary | ICD-10-CM | POA: Diagnosis not present

## 2022-08-19 DIAGNOSIS — C50211 Malignant neoplasm of upper-inner quadrant of right female breast: Secondary | ICD-10-CM | POA: Diagnosis not present

## 2022-08-19 DIAGNOSIS — Z923 Personal history of irradiation: Secondary | ICD-10-CM | POA: Insufficient documentation

## 2022-08-19 DIAGNOSIS — N951 Menopausal and female climacteric states: Secondary | ICD-10-CM | POA: Insufficient documentation

## 2022-08-19 DIAGNOSIS — Z79899 Other long term (current) drug therapy: Secondary | ICD-10-CM | POA: Diagnosis not present

## 2022-08-19 DIAGNOSIS — Z170 Estrogen receptor positive status [ER+]: Secondary | ICD-10-CM | POA: Diagnosis not present

## 2022-08-19 DIAGNOSIS — Z85528 Personal history of other malignant neoplasm of kidney: Secondary | ICD-10-CM | POA: Diagnosis not present

## 2022-08-19 NOTE — Patient Instructions (Signed)
[facility] Discharge Instructions   You were seen and examined today by Dr. Ellin Saba.  He reviewed the results of your lab work which are mostly normal/stable. Your vitamin D is slightly low at 26 (normal range is 30-100).   Continue Tamoxifen as prescribed. You may stop taking in December of 2024. This will complete 5 years of treatment with this medication. We sent a special test to see if you would benefit from taking Tamoxifen for 10 years. It is showing there is no benefit for you to extend your therapy for longer than 5 years.   Return as scheduled.    Thank you for choosing  [facility] to provide your oncology and hematology care.  To afford each patient quality time with our provider, please arrive at least 15 minutes before your scheduled appointment time.   If you have a lab appointment with the Cancer Center please come in thru the Main Entrance and check in at the main information desk.  You need to re-schedule your appointment should you arrive 10 or more minutes late.  We strive to give you quality time with our providers, and arriving late affects you and other patients whose appointments are after yours.  Also, if you no show three or more times for appointments you may be dismissed from the clinic at the providers discretion.     Again, thank you for choosing [facility].  Our hope is that these requests will decrease the amount of time that you wait before being seen by our physicians.       _____________________________________________________________  Should you have questions after your visit to [facility], please contact our office at [phone] and follow the prompts.  Our office hours are 8:00 a.m. and 4:30 p.m. Monday - Friday.  Please note that voicemails left after 4:00 p.m. may not be returned until the following business day.  We are closed weekends and major  holidays.  You do have access to a nurse 24-7, just call the main number to the clinic [phone] and do not press any options, hold on the line and a nurse will answer the phone.    For prescription refill requests, have your pharmacy contact our office and allow 72 hours.    Due to Covid, you will need to wear a mask upon entering the hospital. If you do not have a mask, a mask will be given to you at the Main Entrance upon arrival. For doctor visits, patients may have 1 support person age 79 or older with them. For treatment visits, patients can not have anyone with them due to social distancing guidelines and our immunocompromised population.

## 2022-08-20 ENCOUNTER — Encounter: Payer: Self-pay | Admitting: Physician Assistant

## 2022-08-30 ENCOUNTER — Encounter: Payer: Self-pay | Admitting: Physician Assistant

## 2022-08-30 ENCOUNTER — Ambulatory Visit: Payer: BC Managed Care – PPO

## 2022-08-30 ENCOUNTER — Ambulatory Visit: Payer: BC Managed Care – PPO | Admitting: Physician Assistant

## 2022-08-30 DIAGNOSIS — Z029 Encounter for administrative examinations, unspecified: Secondary | ICD-10-CM

## 2022-08-30 NOTE — OR Nursing (Signed)
Patient called and stated she was having to take her mother to the emergency room and would need to reschedule her procedure. Message sent to office to reschedule.

## 2022-08-31 ENCOUNTER — Telehealth: Payer: Self-pay | Admitting: *Deleted

## 2022-08-31 ENCOUNTER — Encounter: Payer: Self-pay | Admitting: *Deleted

## 2022-08-31 NOTE — Telephone Encounter (Signed)
Crystal R Page, RN  Hey. Mrs Nicolay is having to take her mother to the emergency room and would like to reschedule her procedure. Thank you.    CP She was scheduled for a colonoscopy for tomorrow

## 2022-08-31 NOTE — Telephone Encounter (Signed)
Pt has been rescheduled for 09/21/22. Updated instructions mailed to pt.

## 2022-09-09 ENCOUNTER — Other Ambulatory Visit (INDEPENDENT_AMBULATORY_CARE_PROVIDER_SITE_OTHER): Payer: BC Managed Care – PPO

## 2022-09-09 ENCOUNTER — Ambulatory Visit (INDEPENDENT_AMBULATORY_CARE_PROVIDER_SITE_OTHER): Payer: BC Managed Care – PPO | Admitting: Physician Assistant

## 2022-09-09 ENCOUNTER — Encounter: Payer: Self-pay | Admitting: Physician Assistant

## 2022-09-09 VITALS — HR 74 | Resp 18 | Ht 66.0 in

## 2022-09-09 DIAGNOSIS — R413 Other amnesia: Secondary | ICD-10-CM

## 2022-09-09 LAB — VITAMIN B12: Vitamin B-12: 401 pg/mL (ref 211–911)

## 2022-09-09 NOTE — Patient Instructions (Addendum)
It was a pleasure to see you today at our office.   Recommendations:  MRI of the brain, the radiology office will call you to arrange you appointment   Check labs today   Recommend Psychotherapy for depression Recommend reducing  beer intake Get involved in activities Follow up in 3  months   For assessment of decision of mental capacity and competency:  Call Dr. Erick Blinks, geriatric psychiatrist at [phone] Counseling regarding caregiver distress, including caregiver depression, anxiety and issues regarding community resources, adult day care programs, adult living facilities, or memory care questions:  please contact your  Primary Doctor's Social Worker   Whom to call: Memory  decline, memory medications: Call our office [phone]   For psychiatric meds, mood meds: Please have your primary care physician manage these medications.  If you have any severe symptoms of a stroke, or other severe issues such as confusion,severe chills or fever, etc call 911 or go to the ER as you may need to be evaluated further    RECOMMENDATIONS FOR ALL PATIENTS WITH MEMORY PROBLEMS: 1. Continue to exercise (Recommend 30 minutes of walking everyday, or 3 hours every week) 2. Increase social interactions - continue going to [place] and enjoy social gatherings with friends and family 3. Eat healthy, avoid fried foods and eat more fruits and vegetables 4. Maintain adequate blood pressure, blood sugar, and blood cholesterol level. Reducing the risk of stroke and cardiovascular disease also helps promoting better memory. 5. Avoid stressful situations. Live a simple life and avoid aggravations. Organize your time and prepare for the next day in anticipation. 6. Sleep well, avoid any interruptions of sleep and avoid any distractions in the bedroom that may interfere with adequate sleep quality 7. Avoid sugar, avoid sweets as there is a strong link between excessive sugar intake, diabetes, and  cognitive impairment We discussed the Mediterranean diet, which has been shown to help patients reduce the risk of progressive memory disorders and reduces cardiovascular risk. This includes eating fish, eat fruits and green leafy vegetables, nuts like almonds and hazelnuts, walnuts, and also use olive oil. Avoid fast foods and fried foods as much as possible. Avoid sweets and sugar as sugar use has been linked to worsening of memory function.  There is always a concern of gradual progression of memory problems. If this is the case, then we may need to adjust level of care according to patient needs. Support, both to the patient and caregiver, should then be put into place.     FALL PRECAUTIONS: Be cautious when walking. Scan the area for obstacles that may increase the risk of trips and falls. When getting up in the mornings, sit up at the edge of the bed for a few minutes before getting out of bed. Consider elevating the bed at the head end to avoid drop of blood pressure when getting up. Walk always in a well-lit room (use night lights in the walls). Avoid area rugs or power cords from appliances in the middle of the walkways. Use a walker or a cane if necessary and consider physical therapy for balance exercise. Get your eyesight checked regularly.  FINANCIAL OVERSIGHT: Supervision, especially oversight when making financial decisions or transactions is also recommended.  HOME SAFETY: Consider the safety of the kitchen when operating appliances like stoves, microwave oven, and blender. Consider having supervision and share cooking responsibilities until no longer able to participate in those. Accidents with firearms and other hazards in the house should be identified and  addressed as well.   ABILITY TO BE LEFT ALONE: If patient is unable to contact 911 operator, consider using LifeLine, or when the need is there, arrange for someone to stay with patients. Smoking is a fire hazard, consider  supervision or cessation. Risk of wandering should be assessed by caregiver and if detected at any point, supervision and safe proof recommendations should be instituted.  MEDICATION SUPERVISION: Inability to self-administer medication needs to be constantly addressed. Implement a mechanism to ensure safe administration of the medications.   DRIVING: Regarding driving, in patients with progressive memory problems, driving will be impaired. We advise to have someone else do the driving if trouble finding directions or if minor accidents are reported. Independent driving assessment is available to determine safety of driving.   If you are interested in the driving assessment, you can contact the following:  [employer] in [place]  [place] [phone] or [phone]    [place] refers to food and lifestyle choices that are based on the traditions of countries located on the [employer]. This way of eating has been shown to help prevent certain conditions and improve outcomes for people who have chronic diseases, like kidney disease and heart disease. What are tips for following this plan? Lifestyle  Cook and eat meals together with your family, when possible. Drink enough fluid to keep your urine clear or pale yellow. Be physically active every day. This includes: Aerobic exercise like running or swimming. Leisure activities like gardening, walking, or housework. Get 7-8 hours of sleep each night. If recommended by your health care provider, drink red wine in moderation. This means 1 glass a day for nonpregnant women and 2 glasses a day for men. A glass of wine equals 5 oz (150 mL). Reading food labels  Check the serving size of packaged foods. For foods such as rice and pasta, the serving size refers to the amount of cooked  product, not dry. Check the total fat in packaged foods. Avoid foods that have saturated fat or trans fats. Check the ingredients list for added sugars, such as corn syrup. Shopping  At the grocery store, buy most of your food from the areas near the walls of the store. This includes: Fresh fruits and vegetables (produce). Grains, beans, nuts, and seeds. Some of these may be available in unpackaged forms or large amounts (in bulk). Fresh seafood. Poultry and eggs. Low-fat dairy products. Buy whole ingredients instead of prepackaged foods. Buy fresh fruits and vegetables in-season from local farmers markets. Buy frozen fruits and vegetables in resealable bags. If you do not have access to quality fresh seafood, buy precooked frozen shrimp or canned fish, such as tuna, salmon, or sardines. Buy small amounts of raw or cooked vegetables, salads, or olives from the deli or salad bar at your store. Stock your pantry so you always have certain foods on hand, such as olive oil, canned tuna, canned tomatoes, rice, pasta, and beans. Cooking  Cook foods with extra-virgin olive oil instead of using butter or other vegetable oils. Have meat as a side dish, and have vegetables or grains as your main dish. This means having meat in small portions or adding small amounts of meat to foods like pasta or stew. Use beans or vegetables instead of meat in common dishes like chili or lasagna. Experiment with different cooking methods. Try roasting or broiling vegetables instead of steaming or  sauteing them. Add frozen vegetables to soups, stews, pasta, or rice. Add nuts or seeds for added healthy fat at each meal. You can add these to yogurt, salads, or vegetable dishes. Marinate fish or vegetables using olive oil, lemon juice, garlic, and fresh herbs. Meal planning  Plan to eat 1 vegetarian meal one day each week. Try to work up to 2 vegetarian meals, if possible. Eat seafood 2 or more times a week. Have  healthy snacks readily available, such as: Vegetable sticks with hummus. Greek yogurt. Fruit and nut trail mix. Eat balanced meals throughout the week. This includes: Fruit: 2-3 servings a day Vegetables: 4-5 servings a day Low-fat dairy: 2 servings a day Fish, poultry, or lean meat: 1 serving a day Beans and legumes: 2 or more servings a week Nuts and seeds: 1-2 servings a day Whole grains: 6-8 servings a day Extra-virgin olive oil: 3-4 servings a day Limit red meat and sweets to only a few servings a month What are my food choices? Mediterranean diet Recommended Grains: Whole-grain pasta. Brown rice. Bulgar wheat. Polenta. Couscous. Whole-wheat bread. Orpah Cobb. Vegetables: Artichokes. Beets. Broccoli. Cabbage. Carrots. Eggplant. Green beans. Chard. Kale. Spinach. Onions. Leeks. Peas. Squash. Tomatoes. Peppers. Radishes. Fruits: Apples. Apricots. Avocado. Berries. Bananas. Cherries. Dates. Figs. Grapes. Lemons. Melon. Oranges. Peaches. Plums. Pomegranate. Meats and other protein foods: Beans. Almonds. Sunflower seeds. Pine nuts. Peanuts. Cod. Salmon. Scallops. Shrimp. Tuna. Tilapia. Clams. Oysters. Eggs. Dairy: Low-fat milk. Cheese. Greek yogurt. Beverages: Water. Red wine. Herbal tea. Fats and oils: Extra virgin olive oil. Avocado oil. Grape seed oil. Sweets and desserts: [place] yogurt with honey. Baked apples. Poached pears. Trail mix. Seasoning and other foods: Basil. Cilantro. Coriander. Cumin. Mint. Parsley. Sage. Rosemary. Tarragon. Garlic. Oregano. Thyme. Pepper. Balsalmic vinegar. Tahini. Hummus. Tomato sauce. Olives. Mushrooms. Limit these Grains: Prepackaged pasta or rice dishes. Prepackaged cereal with added sugar. Vegetables: Deep fried potatoes (french fries). Fruits: Fruit canned in syrup. Meats and other protein foods: Beef. Pork. Lamb. Poultry with skin. Hot dogs. Tomasa Blase. Dairy: Ice cream. Sour cream. Whole milk. Beverages: Juice. Sugar-sweetened soft drinks.  Beer. Liquor and spirits. Fats and oils: Butter. Canola oil. Vegetable oil. Beef fat (tallow). Lard. Sweets and desserts: Cookies. Cakes. Pies. Candy. Seasoning and other foods: Mayonnaise. Premade sauces and marinades. The items listed may not be a complete list. Talk with your dietitian about what dietary choices are right for you. Summary The Mediterranean diet includes both food and lifestyle choices. Eat a variety of fresh fruits and vegetables, beans, nuts, seeds, and whole grains. Limit the amount of red meat and sweets that you eat. Talk with your health care provider about whether it is safe for you to drink red wine in moderation. This means 1 glass a day for nonpregnant women and 2 glasses a day for men. A glass of wine equals 5 oz (150 mL). This information is not intended to replace advice given to you by your health care provider. Make sure you discuss any questions you have with your health care provider. Document Released: 10/23/2015 Document Revised: 11/25/2015 Document Reviewed: 10/23/2015 Elsevier Interactive Patient Education  2017 [employer].   [facility] at [facility] [phone]  Labs today [address] today

## 2022-09-09 NOTE — Progress Notes (Signed)
Assessment/Plan:   Jennifer Harvey is a very pleasant 62 y.o. year old LH female with a history of hypertension, hyperlipidemia, depression, history of breast cancer on tamoxifen, R kidney cancer, Vit D deficiency,  seen today for evaluation of memory loss. MoCA today is 20/30.  However, while workup is in progress, suspect that the etiology of her memory issues is of multiple etiologies.  Patient admits to have significant anxiety and situational depression due to her history of cancer on tamoxifen "I have lost all my hair, I do not feel like getting dressed, and my marriage is on the rocks ".  Will discuss attending psychotherapy sessions, and she is to think about it.  In addition, the patient drinks 2 large cans of beer a day, and partakes marijuana twice a day, which certainly can affect memory.  Substance cessation was discussed and counseled.    Memory Impairment of unclear etiology  MRI brain without contrast to assess for underlying structural abnormality and assess vascular load  Check B12,  B1 Continue to control mood as per PCP, recommend psychotherapy for major depression and anxiety Alcohol and marijuana cessation was counseled Recommend good control of cardiovascular risk factors Folllow up in 3 months  Subjective:   The patient is here alone   How long did patient have memory difficulties?  For the last 3 years, when I started taking tamoxifen with some difficulty remembering recent conversations and people names .I  am making poor decisions, I am "foggy brain". "I gave my mama the wrong medicine".  She states that he enjoys going to church, but does not participate in the interim or different activities that the charge provides.  Patient is about to be on disability due to cancer, and has not been very active at home. repeats oneself? Denies Disoriented when walking into a room?"I have to focus to see who is in the room so I know where I am".   Leaving objects in  unusual places?  She misplaces things and cannot find it.   Wandering behavior?  denies   Any personality changes? Yes, sometimes I get angry, especially if someone keeps calling my name"  Any history of depression?:  Endorsed, but has not been seen by therapist. "The only thing it helps me is marijuana, relaxes me and can think better".  She reports that she has difficulty in her marriage, and she is considering divorce.  At that point, the patient began to sob.  Hallucinations or paranoia? Endorsed, 2 weeks ago "I was at home watching TV I saw somebody under the couch but was no one there" Seizures?   Patient denies    Any sleep changes? Does not sleep well.  She has OSA, cannot tolerate the CPAP.  Endorse vivid dreams of falling, death in the family, etc, REM behavior or sleepwalking   Any hygiene concerns? "I used to shower more and dress better, but now I don't really care, since tamoxifen".    Independent of bathing and dressing?  Endorsed  Does the patient needs help with medications? Patient is in charge, sometimes she may forget   Who is in charge of the finances? Husband and her mother in charge     Any changes in appetite? I am eating more     Patient have trouble swallowing? denies   Does the patient cook? Yes  Any kitchen accidents such as leaving the stove on? denies   Any headaches?   denies   Chronic back pain ?  Endorsed L hip pain, takes gabapentin for pain as per PCP. Has never done PT Ambulates with difficulty?  denies that she admits to being deconditioned Recent falls or head injuries? denies   Vision changes? denies   Unilateral weakness, numbness or tingling? denies   Any tremors?   denies   Any anosmia?  denies   Any incontinence of urine? denies   Any bowel dysfunction?  Both  Patient lives  with husband and her mother  History of heavy alcohol intake?  Drinks 2 large cans of beer  a day  History of heavy tobacco use?  She does not smoke cigarettes, but does  marijuana 2 joints a day Family history of dementia? denies  Does patient drive? Yes, denies any issues such as getting lost .   Past Medical History:  Diagnosis Date   Breast cancer (HCC)    Cancer (HCC)    renal cell carcinoma 2007 s/p partial right nephrectomy    History of kidney cancer    Hypertension    Personal history of chemotherapy    Personal history of radiation therapy    Sleep apnea      Past Surgical History:  Procedure Laterality Date   BREAST LUMPECTOMY Right 2019   BREAST SURGERY     CESAREAN SECTION  1994   HYSTEROSCOPY WITH D & C N/A 03/12/2020   Procedure: Hysteroscopy Uterine Curettage;  Surgeon: Lazaro Arms, MD;  Location: [facility];  Service: Gynecology;  Laterality: N/A;   HYSTEROSCOPY WITH D & C N/A 11/11/2020   Procedure: DILATATION AND CURETTAGE /HYSTEROSCOPY;  Surgeon: Myna Hidalgo, DO;  Location: [facility];  Service: Gynecology;  Laterality: N/A;   partial right nephrectomy  2007   POLYPECTOMY N/A 11/11/2020   Procedure: Myosure POLYPECTOMY;  Surgeon: Myna Hidalgo, DO;  Location: [facility];  Service: Gynecology;  Laterality: N/A;     No Known Allergies  Current Outpatient Medications  Medication Instructions   amLODipine (NORVASC) 5 mg, Oral, Daily   benazepril (LOTENSIN) 10 mg, Oral, Daily   dicyclomine (BENTYL) 10 mg, Oral, 3 times daily before meals   famotidine (PEPCID) 20 mg, Oral, 2 times daily   gabapentin (NEURONTIN) 300 mg, Oral, 3 times daily PRN   Multiple Vitamin (MULTIVITAMIN) tablet 1 tablet, Oral, Daily   tamoxifen (NOLVADEX) 20 MG tablet TAKE 1 TABLET(20 MG) BY MOUTH DAILY   Topamax 25 mg, Oral, Daily   venlafaxine (EFFEXOR) 75 mg, Oral, Daily     VITALS:   Vitals:   09/09/22 0951  Pulse: 74  Resp: 18  SpO2: 95%  Height: 5\' 6"  (1.676 m)      PHYSICAL EXAM   HEENT:  Normocephalic, atraumatic. The mucous membranes are moist. The superficial temporal arteries are without ropiness or tenderness. Cardiovascular:  Regular rate and rhythm. Lungs: Clear to auscultation bilaterally. Neck: There are no carotid bruits noted bilaterally.  NEUROLOGICAL:    09/09/2022   12:00 PM  Montreal Cognitive Assessment   Visuospatial/ Executive (0/5) 4  Naming (0/3) 3  Attention: Read list of digits (0/2) 2  Attention: Read list of letters (0/1) 1  Attention: Serial 7 subtraction starting at 100 (0/3) 0  Language: Repeat phrase (0/2) 0  Language : Fluency (0/1) 0  Abstraction (0/2) 0  Delayed Recall (0/5) 3  Orientation (0/6) 6  Total 19  Adjusted Score (based on education) 20        No data to display  Orientation:  Alert and oriented to person, place and time. No aphasia or dysarthria. Fund of knowledge is appropriate. Recent memory impaired and remote memory intact.  Attention and concentration are reduced.  Able to name objects and unable to repeat phrases. Delayed recall 3/6 Cranial nerves: There is good facial symmetry. Extraocular muscles are intact and visual fields are full to confrontational testing. Speech is fluent but very tangential, and clear. No tongue deviation. Hearing is intact to conversational tone. Tone: Tone is good throughout. Sensation: Sensation is intact to light touch and pinprick throughout. Vibration is intact at the bilateral big toe.  Coordination: The patient has no difficulty with RAM's or FNF bilaterally. Normal finger to nose  Motor: Strength is 5/5 in the bilateral upper and lower extremities. There is no pronator drift. There are no fasciculations noted. DTR's: Deep tendon reflexes are 2/4 at the bilateral biceps, triceps, brachioradialis, patella and achilles.  Plantar responses are downgoing bilaterally. Gait and Station: The patient is able to ambulate without difficulty.The patient is able to heel toe walk without any difficulty. Gait is cautious and narrow. The patient is able to ambulate in a tandem fashion. The patient is able to stand in the Romberg  position.     Thank you for allowing [place] the opportunity to participate in the care of this nice patient. Please do not hesitate to contact us for any questions or concerns.   Total time spent on today's visit was 51 minutes dedicated to this patient today, preparing to see patient, examining the patient, ordering tests and/or medications and counseling the patient, documenting clinical information in the EHR or other health record, independently interpreting results and communicating results to the patient/family, discussing treatment and goals, answering patient's questions and coordinating care.  Cc:  Del Nigel Berthold, FNP  Marlowe Kays 09/09/2022 12:28 PM

## 2022-09-10 NOTE — Progress Notes (Signed)
Please let patient know B12 level is on the lower normal.  Recommend B12 1000 mcg daily and follow-up with PCP.  Thank

## 2022-09-13 ENCOUNTER — Ambulatory Visit
Admission: RE | Admit: 2022-09-13 | Discharge: 2022-09-13 | Disposition: A | Discharge status: Home / Self Care | Payer: BC Managed Care – PPO | Source: Ambulatory Visit | Attending: Physician Assistant | Admitting: Physician Assistant

## 2022-09-13 DIAGNOSIS — R413 Other amnesia: Secondary | ICD-10-CM | POA: Diagnosis not present

## 2022-09-13 DIAGNOSIS — Z853 Personal history of malignant neoplasm of breast: Secondary | ICD-10-CM | POA: Diagnosis not present

## 2022-09-13 LAB — VITAMIN B1: Vitamin B1 (Thiamine): 10 nmol/L (ref 8–30)

## 2022-09-13 NOTE — Progress Notes (Signed)
Vitamin B 1 levels are low, start 100 mg daily and follow with PCP. Thanks

## 2022-09-19 ENCOUNTER — Encounter (HOSPITAL_COMMUNITY): Payer: Self-pay | Admitting: *Deleted

## 2022-09-19 ENCOUNTER — Other Ambulatory Visit: Payer: Self-pay

## 2022-09-19 ENCOUNTER — Emergency Department (HOSPITAL_COMMUNITY)
Admission: EM | Admit: 2022-09-19 | Discharge: 2022-09-19 | Disposition: A | Discharge status: Home / Self Care | Payer: BC Managed Care – PPO | Attending: Emergency Medicine | Admitting: Emergency Medicine

## 2022-09-19 DIAGNOSIS — S76212A Strain of adductor muscle, fascia and tendon of left thigh, initial encounter: Secondary | ICD-10-CM | POA: Diagnosis not present

## 2022-09-19 DIAGNOSIS — S76812A Strain of other specified muscles, fascia and tendons at thigh level, left thigh, initial encounter: Secondary | ICD-10-CM | POA: Insufficient documentation

## 2022-09-19 DIAGNOSIS — Z79899 Other long term (current) drug therapy: Secondary | ICD-10-CM | POA: Diagnosis not present

## 2022-09-19 DIAGNOSIS — I10 Essential (primary) hypertension: Secondary | ICD-10-CM | POA: Insufficient documentation

## 2022-09-19 DIAGNOSIS — S39011A Strain of muscle, fascia and tendon of abdomen, initial encounter: Secondary | ICD-10-CM | POA: Diagnosis not present

## 2022-09-19 DIAGNOSIS — R109 Unspecified abdominal pain: Secondary | ICD-10-CM | POA: Diagnosis not present

## 2022-09-19 DIAGNOSIS — X58XXXA Exposure to other specified factors, initial encounter: Secondary | ICD-10-CM | POA: Diagnosis not present

## 2022-09-19 MED ORDER — METHOCARBAMOL 500 MG PO TABS: 500.0000 mg | ORAL_TABLET | Freq: Two times a day (BID) | ORAL | 0 refills | Status: DC | PRN

## 2022-09-19 MED ORDER — NAPROXEN 500 MG PO TABS: 500.0000 mg | ORAL_TABLET | Freq: Two times a day (BID) | ORAL | 0 refills | Status: DC

## 2022-09-19 MED ORDER — KETOROLAC TROMETHAMINE 60 MG/2ML IM SOLN
60.0000 mg | Freq: Once | INTRAMUSCULAR | Status: AC
  Administered 2022-09-19: 60 mg via INTRAMUSCULAR
  Filled 2022-09-19: qty 2

## 2022-09-19 MED ORDER — PREDNISONE 20 MG PO TABS: 40.0000 mg | ORAL_TABLET | Freq: Every day | ORAL | 0 refills | Status: DC

## 2022-09-19 NOTE — ED Notes (Signed)
Medicated per MAR

## 2022-09-19 NOTE — ED Provider Notes (Signed)
[place] EMERGENCY DEPARTMENT AT [facility] Provider Note   CSN: [id number] Arrival date & time: 09/19/22  1700     History  Chief Complaint  Patient presents with   Abdominal Pain    Jennifer Harvey is a 62 y.o. female.   Abdominal Pain    This patient is a 62 year old female, she has a history of hypertension as well as a prior history of diverticulitis, she presents with groin pain on the left side in the inguinal region which has been present for the better part of the last 3 months or so although it has been very  intermittent lasting only a few seconds to a minute and going away.  Last night it has been present more constantly but noticed mostly when she walks.  She has no abdominal pain or pelvic pain no fevers or chills no nausea vomiting or diarrhea, she has no abdominal discomfort at all and has been able to eat and drink.  This is mostly worse when she walks and tries to step forward, it is not associated with any rashes or swelling or injuries that she is aware of. Home Medications Prior to Admission medications   Medication Sig Start Date End Date Taking? Authorizing Provider  methocarbamol (ROBAXIN) 500 MG tablet Take 1 tablet (500 mg total) by mouth 2 (two) times daily as needed for muscle spasms. 09/19/22  Yes Eber Hong, MD  naproxen (NAPROSYN) 500 MG tablet Take 1 tablet (500 mg total) by mouth 2 (two) times daily with a meal. 09/19/22  Yes Eber Hong, MD  predniSONE (DELTASONE) 20 MG tablet Take 2 tablets (40 mg total) by mouth daily. 09/19/22  Yes Eber Hong, MD  amLODipine (NORVASC) 5 MG tablet Take 1 tablet (5 mg total) by mouth daily. 08/11/22   Del Nigel Berthold, FNP  benazepril (LOTENSIN) 10 MG tablet Take 1 tablet (10 mg total) by mouth daily. 08/11/22   Del Nigel Berthold, FNP  cyanocobalamin (VITAMIN B12) 1000 MCG tablet Take 1,000 mcg by mouth daily.    [provider]  dicyclomine (BENTYL) 10 MG capsule Take 1  capsule (10 mg total) by mouth 3 (three) times daily before meals. 07/20/22   Tiffany Kocher, PA-C  famotidine (PEPCID) 20 MG tablet Take 1 tablet (20 mg total) by mouth 2 (two) times daily. Patient not taking: Reported on 09/17/2022 08/11/22   Del Nigel Berthold, FNP  gabapentin (NEURONTIN) 300 MG capsule Take 1 capsule (300 mg total) by mouth 3 (three) times daily as needed. 08/11/22   Del Nigel Berthold, FNP  omeprazole (PRILOSEC) 20 MG capsule Take 20 mg by mouth daily.    [provider]  tamoxifen (NOLVADEX) 20 MG tablet TAKE 1 TABLET(20 MG) BY MOUTH DAILY 06/08/22   Doreatha Massed, MD  Thiamine HCl (VITAMIN B-1) 250 MG tablet Take 250 mg by mouth daily.    [provider]  TOPAMAX 25 MG tablet Take 1 tablet (25 mg total) by mouth daily. Patient not taking: Reported on 09/17/2022 08/11/22   Del Nigel Berthold, FNP  venlafaxine (EFFEXOR) 75 MG tablet Take 1 tablet (75 mg total) by mouth daily. 08/11/22   Del Nigel Berthold, FNP      Allergies    Patient has no known allergies.    Review of Systems   Review of Systems  Gastrointestinal:  Positive for abdominal pain.  All other systems reviewed and are negative.   Physical Exam Updated Vital Signs  BP (!) 148/89 (BP Location: Right Arm)   Pulse 75   Temp 98.9 F (37.2 C) (Oral)   Resp 15   Ht 1.676 m (5\' 6" )   Wt 89.4 kg   LMP 04/29/2020 (Within Weeks) Comment: Bleeding  SpO2 96%   BMI 31.80 kg/m  Physical Exam Vitals and nursing note reviewed.  Constitutional:      General: She is not in acute distress.    Appearance: She is well-developed.  HENT:     Head: Normocephalic and atraumatic.     Mouth/Throat:     Pharynx: No oropharyngeal exudate.  Eyes:     General: No scleral icterus.       Right eye: No discharge.        Left eye: No discharge.     Conjunctiva/sclera: Conjunctivae normal.     Pupils: Pupils are equal, round, and reactive to light.  Neck:     Thyroid: No  thyromegaly.     Vascular: No JVD.  Cardiovascular:     Rate and Rhythm: Normal rate and regular rhythm.     Heart sounds: Normal heart sounds. No murmur heard.    No friction rub. No gallop.  Pulmonary:     Effort: Pulmonary effort is normal. No respiratory distress.     Breath sounds: Normal breath sounds. No wheezing or rales.  Abdominal:     General: Bowel sounds are normal. There is no distension.     Palpations: Abdomen is soft. There is no mass.     Tenderness: There is no abdominal tenderness.  Genitourinary:    Comments: Chaperone present for exam, normal-appearing vulva, normal-appearing inguinal regions, there is no femoral hernia no inguinal hernia no masses no redness no tenderness no lymphadenopathy except for direct tenderness over the inguinal ligament and the proximal hip flexors on the left.  There is no rashes or swelling to that area there is minimal tenderness except for directly over that inguinal ligament.  When I stretch that area on passive stretching the patient has reproducible pain.  She also has mild pain with using her abductors, there is no tenderness around the buttock and no pain with extension at the hip. Musculoskeletal:        General: No tenderness. Normal range of motion.     Cervical back: Normal range of motion and neck supple.  Lymphadenopathy:     Cervical: No cervical adenopathy.  Skin:    General: Skin is warm and dry.     Findings: No erythema or rash.  Neurological:     Mental Status: She is alert.     Coordination: Coordination normal.  Psychiatric:        Behavior: Behavior normal.     ED Results / Procedures / Treatments   Labs (all labs ordered are listed, but only abnormal results are displayed) Labs Reviewed - No data to display  EKG None  Radiology No results found.  Procedures Procedures    Medications Ordered in ED Medications - No data to display  ED Course/ Medical Decision Making/ A&P                              Medical Decision Making  Abdomen is benign, leg exam is consistent with having more of a muscular strain or inguinal strain, she does not have any findings of infection, this does not seem consistent with diverticulitis, she is scheduled for colonoscopy in 2 days which  I think is reasonable.  I will place her on an anti-inflammatory and a muscle relaxer, she may need a course of prednisone if no better.  Patient is agreeable to the plan.  I do not think the patient needs a CT scan as this is not consistent with diverticulitis there is no signs of abscess, no signs of hernia        Final Clinical Impression(s) / ED Diagnoses Final diagnoses:  Inguinal strain, left, initial encounter    Rx / DC Orders ED Discharge Orders          Ordered    naproxen (NAPROSYN) 500 MG tablet  2 times daily with meals        09/19/22 1736    methocarbamol (ROBAXIN) 500 MG tablet  2 times daily PRN        09/19/22 1736    predniSONE (DELTASONE) 20 MG tablet  Daily        09/19/22 1736              Eber Hong, MD 09/19/22 1736

## 2022-09-19 NOTE — ED Notes (Signed)
Pt complains of radiating sciatica pain Went through discharge information  Pt stated that pharmacy is closed today and requests a "pain shot" Informed MD

## 2022-09-19 NOTE — Discharge Instructions (Signed)
Your exam is consistent with having a strain of the muscles.  I would recommend you take Naprosyn twice a day to help with pain, apply ice packs intermittently for no more than 5 minutes with the ice wrapped in a towel to help cool the area down.  I would also recommend some gentle stretching of this area as we discussed in the room before you left.  Please take the muscle relaxer called Robaxin if the pain is more severe or spasming.  If none of these things help you may need to take prednisone which I have also prescribed.  Please let your family doctor know that you are having these pains and establish follow-up within the next week, you can still go for your colonoscopy in 2 days.  ER for worsening symptoms

## 2022-09-19 NOTE — ED Triage Notes (Signed)
Pt with lower abd pain and left groin pain since March, at first intermittent but since last night pain has been constant.  Denies any urinary symptoms.  + N/V last Tuesday.  Denies any diarrhea.

## 2022-09-20 ENCOUNTER — Other Ambulatory Visit: Payer: Self-pay

## 2022-09-20 ENCOUNTER — Encounter (HOSPITAL_COMMUNITY): Payer: Self-pay

## 2022-09-20 ENCOUNTER — Encounter (HOSPITAL_COMMUNITY)
Admission: RE | Admit: 2022-09-20 | Discharge: 2022-09-20 | Disposition: A | Discharge status: Home / Self Care | Payer: BC Managed Care – PPO | Source: Ambulatory Visit | Attending: Internal Medicine | Admitting: Internal Medicine

## 2022-09-20 NOTE — Progress Notes (Signed)
MRI brain shows age related changes but no acute findings

## 2022-09-21 ENCOUNTER — Encounter (HOSPITAL_COMMUNITY)
Admission: RE | Disposition: A | Discharge status: Home / Self Care | Payer: Self-pay | Source: Ambulatory Visit | Attending: Internal Medicine

## 2022-09-21 ENCOUNTER — Ambulatory Visit (HOSPITAL_COMMUNITY): Payer: BC Managed Care – PPO | Admitting: Anesthesiology

## 2022-09-21 ENCOUNTER — Ambulatory Visit (HOSPITAL_COMMUNITY)
Admission: RE | Admit: 2022-09-21 | Discharge: 2022-09-21 | Disposition: A | Discharge status: Home / Self Care | Payer: BC Managed Care – PPO | Source: Ambulatory Visit | Attending: Internal Medicine | Admitting: Internal Medicine

## 2022-09-21 ENCOUNTER — Other Ambulatory Visit: Payer: Self-pay

## 2022-09-21 ENCOUNTER — Encounter (HOSPITAL_COMMUNITY): Payer: Self-pay

## 2022-09-21 DIAGNOSIS — K529 Noninfective gastroenteritis and colitis, unspecified: Secondary | ICD-10-CM | POA: Diagnosis not present

## 2022-09-21 DIAGNOSIS — K219 Gastro-esophageal reflux disease without esophagitis: Secondary | ICD-10-CM | POA: Insufficient documentation

## 2022-09-21 DIAGNOSIS — Z5986 Financial insecurity: Secondary | ICD-10-CM | POA: Diagnosis not present

## 2022-09-21 DIAGNOSIS — K648 Other hemorrhoids: Secondary | ICD-10-CM | POA: Diagnosis not present

## 2022-09-21 DIAGNOSIS — R194 Change in bowel habit: Secondary | ICD-10-CM

## 2022-09-21 DIAGNOSIS — G4730 Sleep apnea, unspecified: Secondary | ICD-10-CM | POA: Diagnosis not present

## 2022-09-21 DIAGNOSIS — K5730 Diverticulosis of large intestine without perforation or abscess without bleeding: Secondary | ICD-10-CM | POA: Insufficient documentation

## 2022-09-21 DIAGNOSIS — D124 Benign neoplasm of descending colon: Secondary | ICD-10-CM | POA: Diagnosis not present

## 2022-09-21 DIAGNOSIS — Z79899 Other long term (current) drug therapy: Secondary | ICD-10-CM | POA: Insufficient documentation

## 2022-09-21 DIAGNOSIS — K644 Residual hemorrhoidal skin tags: Secondary | ICD-10-CM | POA: Diagnosis not present

## 2022-09-21 DIAGNOSIS — Z08 Encounter for follow-up examination after completed treatment for malignant neoplasm: Secondary | ICD-10-CM | POA: Diagnosis not present

## 2022-09-21 DIAGNOSIS — Z85528 Personal history of other malignant neoplasm of kidney: Secondary | ICD-10-CM | POA: Diagnosis not present

## 2022-09-21 DIAGNOSIS — K635 Polyp of colon: Secondary | ICD-10-CM | POA: Diagnosis not present

## 2022-09-21 DIAGNOSIS — Z905 Acquired absence of kidney: Secondary | ICD-10-CM | POA: Diagnosis not present

## 2022-09-21 DIAGNOSIS — R197 Diarrhea, unspecified: Secondary | ICD-10-CM

## 2022-09-21 DIAGNOSIS — I10 Essential (primary) hypertension: Secondary | ICD-10-CM | POA: Diagnosis not present

## 2022-09-21 DIAGNOSIS — D123 Benign neoplasm of transverse colon: Secondary | ICD-10-CM | POA: Insufficient documentation

## 2022-09-21 HISTORY — PX: BIOPSY: SHX5522

## 2022-09-21 HISTORY — PX: COLONOSCOPY WITH PROPOFOL: SHX5780

## 2022-09-21 HISTORY — PX: POLYPECTOMY: SHX5525

## 2022-09-21 SURGERY — COLONOSCOPY WITH PROPOFOL
Anesthesia: General

## 2022-09-21 MED ORDER — PROPOFOL 10 MG/ML IV BOLUS
INTRAVENOUS | Status: DC | PRN
  Administered 2022-09-21: 100 mg via INTRAVENOUS

## 2022-09-21 MED ORDER — PROPOFOL 500 MG/50ML IV EMUL
INTRAVENOUS | Status: DC | PRN
  Administered 2022-09-21: 150 ug/kg/min via INTRAVENOUS

## 2022-09-21 MED ORDER — LACTATED RINGERS IV SOLN: INTRAVENOUS | Status: DC

## 2022-09-21 MED ORDER — LIDOCAINE HCL 1 % IJ SOLN
INTRAMUSCULAR | Status: DC | PRN
  Administered 2022-09-21: 50 mg via INTRADERMAL

## 2022-09-21 NOTE — Op Note (Signed)
[facility] Patient Name: Jadzia Kreisel Procedure Date: 09/21/2022 10:27 AM MRN: [id number] Date of Birth: 15-Apr-1960 Attending MD: Hennie Duos. Marletta Lor , [place], [id number] CSN: [id number] Age: 62 Admit Type: Outpatient Procedure:                Colonoscopy Indications:              Chronic diarrhea Providers:                Hennie Duos. Marletta Lor, DO, Sheran Fava, Dyann Ruddle Referring MD:              Medicines:                See the Anesthesia note for documentation of the                            administered medications Complications:            No immediate complications. Estimated Blood Loss:     Estimated blood loss was minimal. Procedure:                Pre-Anesthesia Assessment:                           - The anesthesia plan was to use monitored                            anesthesia care (MAC).                           After obtaining informed consent, the colonoscope                            was passed under direct vision. Throughout the                            procedure, the patient's blood pressure, pulse, and                            oxygen saturations were monitored continuously. The                            [id number]-HQ190L ([id number]) scope was introduced through                            the anus and advanced to the the terminal ileum,                            with identification of the appendiceal orifice and                            IC valve. The colonoscopy was performed without                            difficulty. The patient tolerated the procedure  well. The quality of the bowel preparation was                            evaluated using the BBPS [facility] Bowel Preparation                            Scale) with scores of: Right Colon = 2 (minor                            amount of residual staining, small fragments of                            stool and/or opaque liquid, but mucosa seen  well),                            Transverse Colon = 2 (minor amount of residual                            staining, small fragments of stool and/or opaque                            liquid, but mucosa seen well) and Left Colon = 2                            (minor amount of residual staining, small fragments                            of stool and/or opaque liquid, but mucosa seen                            well). The total BBPS score equals 6. The quality                            of the bowel preparation was good. Scope In: 10:46:25 AM Scope Out: 11:11:41 AM Scope Withdrawal Time: 0 hours 19 minutes 24 seconds  Total Procedure Duration: 0 hours 25 minutes 16 seconds  Findings:      External internal hemorrhoids were found during endoscopy.      Multiple medium-mouthed and small-mouthed diverticula were found in the       sigmoid colon and descending colon.      The terminal ileum appeared normal.      Two sessile polyps were found in the descending colon and transverse       colon. The polyps were 4 to 5 mm in size. These polyps were removed with       a cold snare. Resection and retrieval were complete.      Localized moderate inflammation characterized by erythema and multiple       cratered ulcers was found in the sigmoid colon (see pictures). Biopsies       were taken with a cold forceps for histology. Further segmental biopsies       were taken throughout the entire colon. Rectum was spared of       inflammation. Impression:               -  External internal hemorrhoids.                           - Diverticulosis in the sigmoid colon and in the                            descending colon.                           - The examined portion of the ileum was normal.                           - Two 4 to 5 mm polyps in the descending colon and                            in the transverse colon, removed with a cold snare.                            Resected and retrieved.                            - Localized moderate inflammation was found in the                            sigmoid colon secondary to colitis. Biopsied. Moderate Sedation:      Per Anesthesia Care Recommendation:           - Patient has a contact number available for                            emergencies. The signs and symptoms of potential                            delayed complications were discussed with the                            patient. Return to normal activities tomorrow.                            Written discharge instructions were provided to the                            patient.                           - Resume previous diet.                           - Continue present medications.                           - Await pathology results.                           - Repeat colonoscopy date to be determined after  pending pathology results are reviewed for                            surveillance based on pathology results.                           - Return to GI clinic in 3 months. Procedure Code(s):        --- Professional ---                           [id number], Colonoscopy, flexible; with removal of                            tumor(s), polyp(s), or other lesion(s) by snare                            technique                           45380, 59, Colonoscopy, flexible; with biopsy,                            single or multiple Diagnosis Code(s):        --- Professional ---                           K64.4, Residual hemorrhoidal skin tags                           K64.8, Other hemorrhoids                           D12.4, Benign neoplasm of descending colon                           D12.3, Benign neoplasm of transverse colon (hepatic                            flexure or splenic flexure)                           K52.9, Noninfective gastroenteritis and colitis,                            unspecified                           K57.30, Diverticulosis of large  intestine without                            perforation or abscess without bleeding CPT copyright 2022 American Medical Association. All rights reserved. The codes documented in this report are preliminary and upon coder review may  be revised to meet current compliance requirements. Hennie Duos. Marletta Lor, DO Hennie Duos. Marletta Lor, DO 09/21/2022 11:16:53 AM This report has been signed electronically. Number of Addenda: 0

## 2022-09-21 NOTE — Anesthesia Preprocedure Evaluation (Addendum)
Anesthesia Evaluation  Patient identified by MRN, date of birth, ID band Patient awake    Reviewed: Allergy & Precautions, H&P , NPO status , Patient's Chart, lab work & pertinent test results  Airway Mallampati: II  TM Distance: >3 FB Neck ROM: Full    Dental  (+) Caps, Dental Advisory Given,    Pulmonary sleep apnea    Pulmonary exam normal breath sounds clear to auscultation       Cardiovascular hypertension, Pt. on medications Normal cardiovascular exam Rhythm:Regular Rate:Normal     Neuro/Psych negative neurological ROS  negative psych ROS   GI/Hepatic ,GERD  Medicated and Controlled,,(+)     substance abuse  alcohol use and marijuana use  Endo/Other  negative endocrine ROS    Renal/GU Renal disease (right partial nephrectomy)  negative genitourinary   Musculoskeletal negative musculoskeletal ROS (+)    Abdominal   Peds negative pediatric ROS (+)  Hematology negative hematology ROS (+)   Anesthesia Other Findings Right breast cancer  Reproductive/Obstetrics negative OB ROS                             Anesthesia Physical Anesthesia Plan  ASA: 2  Anesthesia Plan: General   Post-op Pain Management: Minimal or no pain anticipated   Induction: Intravenous  PONV Risk Score and Plan: 1 and Propofol infusion  Airway Management Planned: Nasal Cannula and Natural Airway  Additional Equipment:   Intra-op Plan:   Post-operative Plan:   Informed Consent: I have reviewed the patients History and Physical, chart, labs and discussed the procedure including the risks, benefits and alternatives for the proposed anesthesia with the patient or authorized representative who has indicated his/her understanding and acceptance.     Dental advisory given  Plan Discussed with: CRNA and Surgeon  Anesthesia Plan Comments:        Anesthesia Quick Evaluation

## 2022-09-21 NOTE — Discharge Instructions (Signed)
  Colonoscopy Discharge Instructions  Read the instructions outlined below and refer to this sheet in the next few weeks. These discharge instructions provide you with general information on caring for yourself after you leave the hospital. Your doctor may also give you specific instructions. While your treatment has been planned according to the most current medical practices available, unavoidable complications occasionally occur.   ACTIVITY You may resume your regular activity, but move at a slower pace for the next 24 hours.  Take frequent rest periods for the next 24 hours.  Walking will help get rid of the air and reduce the bloated feeling in your belly (abdomen).  No driving for 24 hours (because of the medicine (anesthesia) used during the test).   Do not sign any important legal documents or operate any machinery for 24 hours (because of the anesthesia used during the test).  NUTRITION Drink plenty of fluids.  You may resume your normal diet as instructed by your doctor.  Begin with a light meal and progress to your normal diet. Heavy or fried foods are harder to digest and may make you feel sick to your stomach (nauseated).  Avoid alcoholic beverages for 24 hours or as instructed.  MEDICATIONS You may resume your normal medications unless your doctor tells you otherwise.  WHAT YOU CAN EXPECT TODAY Some feelings of bloating in the abdomen.  Passage of more gas than usual.  Spotting of blood in your stool or on the toilet paper.  IF YOU HAD POLYPS REMOVED DURING THE COLONOSCOPY: No aspirin products for 7 days or as instructed.  No alcohol for 7 days or as instructed.  Eat a soft diet for the next 24 hours.  FINDING OUT THE RESULTS OF YOUR TEST Not all test results are available during your visit. If your test results are not back during the visit, make an appointment with your caregiver to find out the results. Do not assume everything is normal if you have not heard from your  caregiver or the medical facility. It is important for you to follow up on all of your test results.  SEEK IMMEDIATE MEDICAL ATTENTION IF: You have more than a spotting of blood in your stool.  Your belly is swollen (abdominal distention).  You are nauseated or vomiting.  You have a temperature over 101.  You have abdominal pain or discomfort that is severe or gets worse throughout the day.   Your colonoscopy revealed 2 polyp(s) which I removed successfully.   You also had inflammation in the left side your colon with multiple ulcers.  I took samples of this area as well as your entire colon.  Await pathology results, my office will contact you.   Follow-up in GI office in 6 weeks.   I hope you have a great rest of your week!  Hennie Duos. Marletta Lor, D.O. Gastroenterology and Hepatology [facility]

## 2022-09-21 NOTE — Anesthesia Postprocedure Evaluation (Signed)
Anesthesia Post Note  Patient: Jennifer Harvey  Procedure(s) Performed: COLONOSCOPY WITH PROPOFOL BIOPSY POLYPECTOMY  Patient location during evaluation: Endoscopy Anesthesia Type: General Level of consciousness: awake and alert Pain management: pain level controlled Vital Signs Assessment: post-procedure vital signs reviewed and stable Respiratory status: spontaneous breathing Cardiovascular status: blood pressure returned to baseline and stable Postop Assessment: no apparent nausea or vomiting Anesthetic complications: no   No notable events documented.   Last Vitals:  Vitals:   09/21/22 0906 09/21/22 1114  BP: (!) 141/79 113/64  Pulse: 66 79  Resp: (!) 21 20  Temp: 37 C 36.9 C  SpO2: 97% 96%    Last Pain:  Vitals:   09/21/22 1114  TempSrc: Oral  PainSc: 0-No pain                 Kimra Kantor

## 2022-09-21 NOTE — Transfer of Care (Signed)
Immediate Anesthesia Transfer of Care Note  Patient: Jennifer Harvey  Procedure(s) Performed: COLONOSCOPY WITH PROPOFOL BIOPSY POLYPECTOMY  Patient Location: Endoscopy Unit  Anesthesia Type:General  Level of Consciousness: awake  Airway & Oxygen Therapy: Patient Spontanous Breathing  Post-op Assessment: Report given to RN  Post vital signs: Reviewed and stable  Last Vitals:  Vitals Value Taken Time  BP    Temp    Pulse    Resp    SpO2      Last Pain:  Vitals:   09/21/22 1114  TempSrc: Oral  PainSc: 0-No pain      Patients Stated Pain Goal: 3 (09/21/22 0906)  Complications: No notable events documented.

## 2022-09-21 NOTE — H&P (Signed)
Primary Care Physician:  Rica Records, FNP Primary Gastroenterologist:  Dr. Marletta Lor  Pre-Procedure History & Physical: HPI:  Jennifer Harvey is a 62 y.o. female is here  for a colonoscopy to be performed for chronic diarrhea  Past Medical History:  Diagnosis Date   Breast cancer (HCC)    Cancer (HCC)    renal cell carcinoma 2007 s/p partial right nephrectomy    History of kidney cancer    Hypertension    Personal history of chemotherapy    Personal history of radiation therapy    Sleep apnea     Past Surgical History:  Procedure Laterality Date   BREAST LUMPECTOMY Right 2019   BREAST SURGERY     CESAREAN SECTION  1994   COLONOSCOPY     HYSTEROSCOPY WITH D & C N/A 03/12/2020   Procedure: Hysteroscopy Uterine Curettage;  Surgeon: Lazaro Arms, MD;  Location: [facility];  Service: Gynecology;  Laterality: N/A;   HYSTEROSCOPY WITH D & C N/A 11/11/2020   Procedure: DILATATION AND CURETTAGE /HYSTEROSCOPY;  Surgeon: Myna Hidalgo, DO;  Location: [facility];  Service: Gynecology;  Laterality: N/A;   partial right nephrectomy  2007   POLYPECTOMY N/A 11/11/2020   Procedure: Myosure POLYPECTOMY;  Surgeon: Myna Hidalgo, DO;  Location: [facility];  Service: Gynecology;  Laterality: N/A;    Prior to Admission medications   Medication Sig Start Date End Date Taking? Authorizing Provider  amLODipine (NORVASC) 5 MG tablet Take 1 tablet (5 mg total) by mouth daily. 08/11/22  Yes Del Newman Nip, Tenna Child, FNP  benazepril (LOTENSIN) 10 MG tablet Take 1 tablet (10 mg total) by mouth daily. 08/11/22  Yes Del Newman Nip, Tenna Child, FNP  cyanocobalamin (VITAMIN B12) 1000 MCG tablet Take 1,000 mcg by mouth daily.   Yes [provider]  dicyclomine (BENTYL) 10 MG capsule Take 1 capsule (10 mg total) by mouth 3 (three) times daily before meals. 07/20/22  Yes Tiffany Kocher, PA-C  famotidine (PEPCID) 20 MG tablet Take 1 tablet (20 mg total) by mouth 2 (two) times daily. 08/11/22  Yes Del Newman Nip, Tenna Child, FNP  gabapentin (NEURONTIN) 300 MG capsule Take 1 capsule (300 mg total) by mouth 3 (three) times daily as needed. 08/11/22  Yes Del Newman Nip, Tenna Child, FNP  methocarbamol (ROBAXIN) 500 MG tablet Take 1 tablet (500 mg total) by mouth 2 (two) times daily as needed for muscle spasms. 09/19/22  Yes Eber Hong, MD  naproxen (NAPROSYN) 500 MG tablet Take 1 tablet (500 mg total) by mouth 2 (two) times daily with a meal. 09/19/22  Yes Eber Hong, MD  omeprazole (PRILOSEC) 20 MG capsule Take 20 mg by mouth daily.   Yes [provider]  predniSONE (DELTASONE) 20 MG tablet Take 2 tablets (40 mg total) by mouth daily. 09/19/22  Yes Eber Hong, MD  tamoxifen (NOLVADEX) 20 MG tablet TAKE 1 TABLET(20 MG) BY MOUTH DAILY 06/08/22  Yes Doreatha Massed, MD  Thiamine HCl (VITAMIN B-1) 250 MG tablet Take 250 mg by mouth daily.   Yes [provider]  TOPAMAX 25 MG tablet Take 1 tablet (25 mg total) by mouth daily. 08/11/22  Yes Del Newman Nip, Tenna Child, FNP  venlafaxine (EFFEXOR) 75 MG tablet Take 1 tablet (75 mg total) by mouth daily. 08/11/22  Yes Del Nigel Berthold, FNP    Allergies as of 08/17/2022   (No Known Allergies)    Family History  Problem Relation Age of Onset   Other Father  auto accident   Cancer Maternal Grandmother        stomach   Cancer Maternal Grandfather        kidney cancer   Colon cancer Neg Hx     Social History   Socioeconomic History   Marital status: Married    Spouse name: Not on file   Number of children: 4   Years of education: 10   Highest education level: Not on file  Occupational History   Occupation: makes cookies    Employer: [employer]  Tobacco Use   Smoking status: Never   Smokeless tobacco: Never  Vaping Use   Vaping Use: Never used  Substance and Sexual Activity   Alcohol use: Yes    Alcohol/week: 14.0 standard drinks of alcohol    Types: 14 Cans of beer per week    Comment: 2 beers per day   Drug use:  Yes    Types: Marijuana    Comment: last used 09/19/22   Sexual activity: Not Currently    Birth control/protection: Post-menopausal  Other Topics Concern   Not on file  Social History Narrative   Left handed   One floor home    Lives with husband and her mom   No caffeine   Not employed at this time   Social Determinants of Health   Financial Resource Strain: High Risk (02/16/2022)   Overall Financial Resource Strain (CARDIA)    Difficulty of Paying Living Expenses: Hard  Food Insecurity: Food Insecurity Present (02/16/2022)   Hunger Vital Sign    Worried About Running Out of Food in the Last Year: Never true    Ran Out of Food in the Last Year: Sometimes true  Transportation Needs: No Transportation Needs (02/16/2022)   PRAPARE - Administrator, Civil Service (Medical): No    Lack of Transportation (Non-Medical): No  Physical Activity: Inactive (02/16/2022)   Exercise Vital Sign    Days of Exercise per Week: 0 days    Minutes of Exercise per Session: 0 min  Stress: No Stress Concern Present (02/16/2022)   [employer] of Occupational Health - Occupational Stress Questionnaire    Feeling of Stress : Only a little  Social Connections: Moderately Integrated (02/16/2022)   Social Connection and Isolation Panel [NHANES]    Frequency of Communication with Friends and Family: More than three times a week    Frequency of Social Gatherings with Friends and Family: Once a week    Attends Religious Services: 1 to 4 times per year    Active Member of [employer] or Organizations: No    Attends Banker Meetings: Never    Marital Status: Married  Catering manager Violence: Not At Risk (02/16/2022)   Humiliation, Afraid, Rape, and Kick questionnaire    Fear of Current or Ex-Partner: No    Emotionally Abused: No    Physically Abused: No    Sexually Abused: No    Review of Systems: See HPI, otherwise negative ROS  Physical Exam: Vital signs in last 24  hours: Temp:  [98.6 F (37 C)] 98.6 F (37 C) (07/09 0906) Pulse Rate:  [66] 66 (07/09 0906) Resp:  [21] 21 (07/09 0906) BP: (141)/(79) 141/79 (07/09 0906) SpO2:  [97 %] 97 % (07/09 0906)   General:   Alert,  Well-developed, well-nourished, pleasant and cooperative in NAD Head:  Normocephalic and atraumatic. Eyes:  Sclera clear, no icterus.   Conjunctiva pink. Ears:  Normal auditory acuity. Nose:  No deformity, discharge,  or lesions. Msk:  Symmetrical without gross deformities. Normal posture. Extremities:  Without clubbing or edema. Neurologic:  Alert and  oriented x4;  grossly normal neurologically. Skin:  Intact without significant lesions or rashes. Psych:  Alert and cooperative. Normal mood and affect.  Impression/Plan: Jennifer Harvey is here for a colonoscopy to be performed for chronic diarrhea  The risks of the procedure including infection, bleed, or perforation as well as benefits, limitations, alternatives and imponderables have been reviewed with the patient. Questions have been answered. All parties agreeable.

## 2022-09-22 LAB — SURGICAL PATHOLOGY

## 2022-09-28 ENCOUNTER — Encounter (HOSPITAL_COMMUNITY): Payer: Self-pay | Admitting: Internal Medicine

## 2022-10-13 ENCOUNTER — Telehealth: Payer: Self-pay | Admitting: Family Medicine

## 2022-10-13 NOTE — Telephone Encounter (Signed)
Patient came by office. Wants to see if provider can write a letter stating that she is fully disabled. Patient is going through disability.   Wants a call back in regard.

## 2022-10-15 DIAGNOSIS — H9313 Tinnitus, bilateral: Secondary | ICD-10-CM | POA: Diagnosis not present

## 2022-10-15 DIAGNOSIS — H903 Sensorineural hearing loss, bilateral: Secondary | ICD-10-CM | POA: Diagnosis not present

## 2022-10-15 DIAGNOSIS — H9319 Tinnitus, unspecified ear: Secondary | ICD-10-CM | POA: Diagnosis not present

## 2022-10-15 DIAGNOSIS — Z853 Personal history of malignant neoplasm of breast: Secondary | ICD-10-CM | POA: Diagnosis not present

## 2022-11-01 NOTE — Progress Notes (Unsigned)
GI Office Note    Referring Provider: Wylene Men* Primary Care Physician:  Rica Records, FNP  Primary Gastroenterologist: Hennie Duos. Marletta Lor, DO   Chief Complaint   No chief complaint on file.   History of Present Illness   Jennifer Harvey is a 62 y.o. female presenting today for follow up colonoscopy. Seen in office in 07/2022. See at that time with six month history of postprandial abdominal pain, bloating, diarrhea, fecal urgency, oily/orange stools. H/o fatty liver on CT with normal LFTS. H/o prior heavy etoh use with ongoing modest use. Started on bentyl.  Fecal pancreatitic elastase 238, GI profile negative. Sed rate/CRP, TSH, celiac markers all normal.  Colonoscopy 09/2022:  -external internal hemorrhoids -diverticulosis -ileum normal -two 4-5 mm polyps descending colon and trv colon removed (tubular adenoma) -localized moderate inflammation found in sigmoid colon secondary to colitis s/p bx -cecum/ascending bx negative -trv colon bx negative -descending colon bx negative -sigmoid colon bx with congestion and intramucosal hemorrhage and reactive epithelial changes -rectal bx with intraepithelial histocytes -etiology of inflammation and congestion unknown, ?NSAIDs -next colonoscopy five years.     Medications   Current Outpatient Medications  Medication Sig Dispense Refill   amLODipine (NORVASC) 5 MG tablet Take 1 tablet (5 mg total) by mouth daily. 90 tablet 1   benazepril (LOTENSIN) 10 MG tablet Take 1 tablet (10 mg total) by mouth daily. 90 tablet 3   cyanocobalamin (VITAMIN B12) 1000 MCG tablet Take 1,000 mcg by mouth daily.     dicyclomine (BENTYL) 10 MG capsule Take 1 capsule (10 mg total) by mouth 3 (three) times daily before meals. 90 capsule 3   famotidine (PEPCID) 20 MG tablet Take 1 tablet (20 mg total) by mouth 2 (two) times daily. 90 tablet 3   gabapentin (NEURONTIN) 300 MG capsule Take 1 capsule (300 mg total) by mouth  3 (three) times daily as needed. 90 capsule 3   methocarbamol (ROBAXIN) 500 MG tablet Take 1 tablet (500 mg total) by mouth 2 (two) times daily as needed for muscle spasms. 20 tablet 0   naproxen (NAPROSYN) 500 MG tablet Take 1 tablet (500 mg total) by mouth 2 (two) times daily with a meal. 30 tablet 0   omeprazole (PRILOSEC) 20 MG capsule Take 20 mg by mouth daily.     predniSONE (DELTASONE) 20 MG tablet Take 2 tablets (40 mg total) by mouth daily. 10 tablet 0   tamoxifen (NOLVADEX) 20 MG tablet TAKE 1 TABLET(20 MG) BY MOUTH DAILY 30 tablet 0   Thiamine HCl (VITAMIN B-1) 250 MG tablet Take 250 mg by mouth daily.     TOPAMAX 25 MG tablet Take 1 tablet (25 mg total) by mouth daily. 30 tablet 3   venlafaxine (EFFEXOR) 75 MG tablet Take 1 tablet (75 mg total) by mouth daily. 90 tablet 1   No current facility-administered medications for this visit.    Allergies   Allergies as of 11/02/2022   (No Known Allergies)     Past Medical History   Past Medical History:  Diagnosis Date   Breast cancer (HCC)    Cancer (HCC)    renal cell carcinoma 2007 s/p partial right nephrectomy    History of kidney cancer    Hypertension    Personal history of chemotherapy    Personal history of radiation therapy    Sleep apnea     Past Surgical History   Past Surgical History:  Procedure Laterality Date  BIOPSY  09/21/2022   Procedure: BIOPSY;  Surgeon: Lanelle Bal, DO;  Location: [facility];  Service: Endoscopy;;   BREAST LUMPECTOMY Right 2019   BREAST SURGERY     CESAREAN SECTION  1994   COLONOSCOPY     COLONOSCOPY WITH PROPOFOL N/A 09/21/2022   Procedure: COLONOSCOPY WITH PROPOFOL;  Surgeon: Lanelle Bal, DO;  Location: [facility];  Service: Endoscopy;  Laterality: N/A;  random colon biopsies, 145pm, asa 2   HYSTEROSCOPY WITH D & C N/A 03/12/2020   Procedure: Hysteroscopy Uterine Curettage;  Surgeon: Lazaro Arms, MD;  Location: [facility];  Service: Gynecology;  Laterality: N/A;    HYSTEROSCOPY WITH D & C N/A 11/11/2020   Procedure: DILATATION AND CURETTAGE /HYSTEROSCOPY;  Surgeon: Myna Hidalgo, DO;  Location: [facility];  Service: Gynecology;  Laterality: N/A;   partial right nephrectomy  2007   POLYPECTOMY N/A 11/11/2020   Procedure: Myosure POLYPECTOMY;  Surgeon: Myna Hidalgo, DO;  Location: [facility];  Service: Gynecology;  Laterality: N/A;   POLYPECTOMY  09/21/2022   Procedure: POLYPECTOMY;  Surgeon: Lanelle Bal, DO;  Location: [facility];  Service: Endoscopy;;    Past Family History   Family History  Problem Relation Age of Onset   Other Father        auto accident   Cancer Maternal Grandmother        stomach   Cancer Maternal Grandfather        kidney cancer   Colon cancer Neg Hx     Past Social History   Social History   Socioeconomic History   Marital status: Married    Spouse name: Not on file   Number of children: 4   Years of education: 10   Highest education level: Not on file  Occupational History   Occupation: makes cookies    Employer: [employer]  Tobacco Use   Smoking status: Never   Smokeless tobacco: Never  Vaping Use   Vaping status: Never Used  Substance and Sexual Activity   Alcohol use: Yes    Alcohol/week: 14.0 standard drinks of alcohol    Types: 14 Cans of beer per week    Comment: 2 beers per day   Drug use: Yes    Types: Marijuana    Comment: last used 09/19/22   Sexual activity: Not Currently    Birth control/protection: Post-menopausal  Other Topics Concern   Not on file  Social History Narrative   Left handed   One floor home    Lives with husband and her mom   No caffeine   Not employed at this time   Social Determinants of Health   Financial Resource Strain: High Risk (02/16/2022)   Overall Financial Resource Strain (CARDIA)    Difficulty of Paying Living Expenses: Hard  Food Insecurity: Food Insecurity Present (02/16/2022)   Hunger Vital Sign    Worried About Running Out of Food in the Last Year:  Never true    Ran Out of Food in the Last Year: Sometimes true  Transportation Needs: No Transportation Needs (02/16/2022)   PRAPARE - Administrator, Civil Service (Medical): No    Lack of Transportation (Non-Medical): No  Physical Activity: Inactive (02/16/2022)   Exercise Vital Sign    Days of Exercise per Week: 0 days    Minutes of Exercise per Session: 0 min  Stress: No Stress Concern Present (02/16/2022)   [employer] of Occupational Health - Occupational Stress Questionnaire  Feeling of Stress : Only a little  Social Connections: Moderately Integrated (02/16/2022)   Social Connection and Isolation Panel [NHANES]    Frequency of Communication with Friends and Family: More than three times a week    Frequency of Social Gatherings with Friends and Family: Once a week    Attends Religious Services: 1 to 4 times per year    Active Member of [employer] or Organizations: No    Attends Banker Meetings: Never    Marital Status: Married  Catering manager Violence: Not At Risk (02/16/2022)   Humiliation, Afraid, Rape, and Kick questionnaire    Fear of Current or Ex-Partner: No    Emotionally Abused: No    Physically Abused: No    Sexually Abused: No    Review of Systems   General: Negative for anorexia, weight loss, fever, chills, fatigue, weakness. ENT: Negative for hoarseness, difficulty swallowing , nasal congestion. CV: Negative for chest pain, angina, palpitations, dyspnea on exertion, peripheral edema.  Respiratory: Negative for dyspnea at rest, dyspnea on exertion, cough, sputum, wheezing.  GI: See history of present illness. GU:  Negative for dysuria, hematuria, urinary incontinence, urinary frequency, nocturnal urination.  Endo: Negative for unusual weight change.     Physical Exam   LMP 04/29/2020 (Within Weeks) Comment: Bleeding   General: Well-nourished, well-developed in no acute distress.  Eyes: No icterus. Mouth: Oropharyngeal mucosa  moist and pink , no lesions erythema or exudate. Lungs: Clear to auscultation bilaterally.  Heart: Regular rate and rhythm, no murmurs rubs or gallops.  Abdomen: Bowel sounds are normal, nontender, nondistended, no hepatosplenomegaly or masses,  no abdominal bruits or hernia , no rebound or guarding.  Rectal: ***  Extremities: No lower extremity edema. No clubbing or deformities. Neuro: Alert and oriented x 4   Skin: Warm and dry, no jaundice.   Psych: Alert and cooperative, normal mood and affect.  Labs   *** Imaging Studies   No results found.  Assessment       PLAN   ***   Leanna Battles. Melvyn Neth, MHS, PA-C [facility]

## 2022-11-02 ENCOUNTER — Ambulatory Visit (INDEPENDENT_AMBULATORY_CARE_PROVIDER_SITE_OTHER): Payer: BC Managed Care – PPO | Admitting: Gastroenterology

## 2022-11-02 ENCOUNTER — Encounter: Payer: Self-pay | Admitting: Gastroenterology

## 2022-11-02 VITALS — BP 138/83 | HR 81 | Temp 97.6°F | Ht 66.0 in | Wt 200.8 lb

## 2022-11-02 DIAGNOSIS — R1084 Generalized abdominal pain: Secondary | ICD-10-CM | POA: Diagnosis not present

## 2022-11-02 DIAGNOSIS — R197 Diarrhea, unspecified: Secondary | ICD-10-CM

## 2022-11-02 DIAGNOSIS — K219 Gastro-esophageal reflux disease without esophagitis: Secondary | ICD-10-CM

## 2022-11-02 DIAGNOSIS — K760 Fatty (change of) liver, not elsewhere classified: Secondary | ICD-10-CM | POA: Diagnosis not present

## 2022-11-02 NOTE — Patient Instructions (Signed)
Try cutting back further on alcohol use due to your history of fatty liver.  We will plan for ultrasound of your liver in 07/2023.  Continue dicyclomine 3 times daily before meals for cramps and loose stools. If you notice any hard stools, you can cut back to twice daily.  Continue omeprazole 40mg  daily for acid reflux. Return office visit in six months.    It was a pleasure to see you today.  I truly value your feedback, so please be on the lookout for a survey regarding your visit with me today. I appreciate your time in completing this!

## 2022-11-02 NOTE — Progress Notes (Deleted)
BM 3-4 times per day with bentyl. Stools more formed. Less urgency. Occasional loose stools with fatty food. No melena, brbpr. Heartburn controlled   Update med list on my note   Ov 6 months prn.  ***RUQ U/S

## 2022-11-11 ENCOUNTER — Ambulatory Visit: Payer: BC Managed Care – PPO | Admitting: Family Medicine

## 2022-11-16 ENCOUNTER — Other Ambulatory Visit: Payer: Self-pay | Admitting: Hematology

## 2022-11-16 ENCOUNTER — Encounter: Payer: Self-pay | Admitting: Family Medicine

## 2022-12-03 ENCOUNTER — Ambulatory Visit: Payer: BC Managed Care – PPO | Admitting: Physician Assistant

## 2022-12-07 ENCOUNTER — Ambulatory Visit: Payer: BC Managed Care – PPO | Admitting: Family Medicine

## 2023-01-13 ENCOUNTER — Telehealth: Payer: BC Managed Care – PPO | Admitting: Physician Assistant

## 2023-01-13 DIAGNOSIS — R413 Other amnesia: Secondary | ICD-10-CM

## 2023-01-13 NOTE — Patient Instructions (Signed)
It was a pleasure to see you today at our office.   Recommendations:    Recommend reducing  beer intake Get involved in activities Follow up Feb 4  at 11:30    For assessment of decision of mental capacity and competency:  Call Dr. Erick Blinks, geriatric psychiatrist at [phone] Counseling regarding caregiver distress, including caregiver depression, anxiety and issues regarding community resources, adult day care programs, adult living facilities, or memory care questions:  please contact your  Primary Doctor's Social Worker   Whom to call: Memory  decline, memory medications: Call our office [phone]   For psychiatric meds, mood meds: Please have your primary care physician manage these medications.  If you have any severe symptoms of a stroke, or other severe issues such as confusion,severe chills or fever, etc call 911 or go to the ER as you may need to be evaluated further    RECOMMENDATIONS FOR ALL PATIENTS WITH MEMORY PROBLEMS: 1. Continue to exercise (Recommend 30 minutes of walking everyday, or 3 hours every week) 2. Increase social interactions - continue going to [place] and enjoy social gatherings with friends and family 3. Eat healthy, avoid fried foods and eat more fruits and vegetables 4. Maintain adequate blood pressure, blood sugar, and blood cholesterol level. Reducing the risk of stroke and cardiovascular disease also helps promoting better memory. 5. Avoid stressful situations. Live a simple life and avoid aggravations. Organize your time and prepare for the next day in anticipation. 6. Sleep well, avoid any interruptions of sleep and avoid any distractions in the bedroom that may interfere with adequate sleep quality 7. Avoid sugar, avoid sweets as there is a strong link between excessive sugar intake, diabetes, and cognitive impairment We discussed the Mediterranean diet, which has been shown to help patients reduce the risk of progressive memory disorders  and reduces cardiovascular risk. This includes eating fish, eat fruits and green leafy vegetables, nuts like almonds and hazelnuts, walnuts, and also use olive oil. Avoid fast foods and fried foods as much as possible. Avoid sweets and sugar as sugar use has been linked to worsening of memory function.  There is always a concern of gradual progression of memory problems. If this is the case, then we may need to adjust level of care according to patient needs. Support, both to the patient and caregiver, should then be put into place.     FALL PRECAUTIONS: Be cautious when walking. Scan the area for obstacles that may increase the risk of trips and falls. When getting up in the mornings, sit up at the edge of the bed for a few minutes before getting out of bed. Consider elevating the bed at the head end to avoid drop of blood pressure when getting up. Walk always in a well-lit room (use night lights in the walls). Avoid area rugs or power cords from appliances in the middle of the walkways. Use a walker or a cane if necessary and consider physical therapy for balance exercise. Get your eyesight checked regularly.  FINANCIAL OVERSIGHT: Supervision, especially oversight when making financial decisions or transactions is also recommended.  HOME SAFETY: Consider the safety of the kitchen when operating appliances like stoves, microwave oven, and blender. Consider having supervision and share cooking responsibilities until no longer able to participate in those. Accidents with firearms and other hazards in the house should be identified and addressed as well.   ABILITY TO BE LEFT ALONE: If patient is unable to contact 911 operator, consider using [facility],  or when the need is there, arrange for someone to stay with patients. Smoking is a fire hazard, consider supervision or cessation. Risk of wandering should be assessed by caregiver and if detected at any point, supervision and safe proof recommendations  should be instituted.  MEDICATION SUPERVISION: Inability to self-administer medication needs to be constantly addressed. Implement a mechanism to ensure safe administration of the medications.   DRIVING: Regarding driving, in patients with progressive memory problems, driving will be impaired. We advise to have someone else do the driving if trouble finding directions or if minor accidents are reported. Independent driving assessment is available to determine safety of driving.   If you are interested in the driving assessment, you can contact the following:  [facility] [employer] [facility] in [place] [phone]  [facility] [phone]  [facility] [phone] or [phone]    Mediterranean Diet A Mediterranean diet refers to food and lifestyle choices that are based on the traditions of countries located on the [employer]. This way of eating has been shown to help prevent certain conditions and improve outcomes for people who have chronic diseases, like kidney disease and heart disease. What are tips for following this plan? Lifestyle  Cook and eat meals together with your family, when possible. Drink enough fluid to keep your urine clear or pale yellow. Be physically active every day. This includes: Aerobic exercise like running or swimming. Leisure activities like gardening, walking, or housework. Get 7-8 hours of sleep each night. If recommended by your health care provider, drink red wine in moderation. This means 1 glass a day for nonpregnant women and 2 glasses a day for men. A glass of wine equals 5 oz (150 mL). Reading food labels  Check the serving size of packaged foods. For foods such as rice and pasta, the serving size refers to the amount of cooked product, not dry. Check the total fat in packaged foods. Avoid foods that have saturated fat or trans fats. Check the ingredients list for added sugars,  such as corn syrup. Shopping  At the grocery store, buy most of your food from the areas near the walls of the store. This includes: Fresh fruits and vegetables (produce). Grains, beans, nuts, and seeds. Some of these may be available in unpackaged forms or large amounts (in bulk). Fresh seafood. Poultry and eggs. Low-fat dairy products. Buy whole ingredients instead of prepackaged foods. Buy fresh fruits and vegetables in-season from local farmers markets. Buy frozen fruits and vegetables in resealable bags. If you do not have access to quality fresh seafood, buy precooked frozen shrimp or canned fish, such as tuna, salmon, or sardines. Buy small amounts of raw or cooked vegetables, salads, or olives from the deli or salad bar at your store. Stock your pantry so you always have certain foods on hand, such as olive oil, canned tuna, canned tomatoes, rice, pasta, and beans. Cooking  Cook foods with extra-virgin olive oil instead of using butter or other vegetable oils. Have meat as a side dish, and have vegetables or grains as your main dish. This means having meat in small portions or adding small amounts of meat to foods like pasta or stew. Use beans or vegetables instead of meat in common dishes like chili or lasagna. Experiment with different cooking methods. Try roasting or broiling vegetables instead of steaming or sauteing them. Add frozen vegetables to soups, stews, pasta, or rice. Add nuts or seeds for added healthy fat at each  meal. You can add these to yogurt, salads, or vegetable dishes. Marinate fish or vegetables using olive oil, lemon juice, garlic, and fresh herbs. Meal planning  Plan to eat 1 vegetarian meal one day each week. Try to work up to 2 vegetarian meals, if possible. Eat seafood 2 or more times a week. Have healthy snacks readily available, such as: Vegetable sticks with hummus. Greek yogurt. Fruit and nut trail mix. Eat balanced meals throughout the week. This  includes: Fruit: 2-3 servings a day Vegetables: 4-5 servings a day Low-fat dairy: 2 servings a day Fish, poultry, or lean meat: 1 serving a day Beans and legumes: 2 or more servings a week Nuts and seeds: 1-2 servings a day Whole grains: 6-8 servings a day Extra-virgin olive oil: 3-4 servings a day Limit red meat and sweets to only a few servings a month What are my food choices? Mediterranean diet Recommended Grains: Whole-grain pasta. Brown rice. Bulgar wheat. Polenta. Couscous. Whole-wheat bread. Orpah Cobb. Vegetables: Artichokes. Beets. Broccoli. Cabbage. Carrots. Eggplant. Green beans. Chard. Kale. Spinach. Onions. Leeks. Peas. Squash. Tomatoes. Peppers. Radishes. Fruits: Apples. Apricots. Avocado. Berries. Bananas. Cherries. Dates. Figs. Grapes. Lemons. Melon. Oranges. Peaches. Plums. Pomegranate. Meats and other protein foods: Beans. Almonds. Sunflower seeds. Pine nuts. Peanuts. Cod. Salmon. Scallops. Shrimp. Tuna. Tilapia. Clams. Oysters. Eggs. Dairy: Low-fat milk. Cheese. Greek yogurt. Beverages: Water. Red wine. Herbal tea. Fats and oils: Extra virgin olive oil. Avocado oil. Grape seed oil. Sweets and desserts: [place] yogurt with honey. Baked apples. Poached pears. Trail mix. Seasoning and other foods: Basil. Cilantro. Coriander. Cumin. Mint. Parsley. Sage. Rosemary. Tarragon. Garlic. Oregano. Thyme. Pepper. Balsalmic vinegar. Tahini. Hummus. Tomato sauce. Olives. Mushrooms. Limit these Grains: Prepackaged pasta or rice dishes. Prepackaged cereal with added sugar. Vegetables: Deep fried potatoes (french fries). Fruits: Fruit canned in syrup. Meats and other protein foods: Beef. Pork. Lamb. Poultry with skin. Hot dogs. Tomasa Blase. Dairy: Ice cream. Sour cream. Whole milk. Beverages: Juice. Sugar-sweetened soft drinks. Beer. Liquor and spirits. Fats and oils: Butter. Canola oil. Vegetable oil. Beef fat (tallow). Lard. Sweets and desserts: Cookies. Cakes. Pies. Candy. Seasoning  and other foods: Mayonnaise. Premade sauces and marinades. The items listed may not be a complete list. Talk with your dietitian about what dietary choices are right for you. Summary The Mediterranean diet includes both food and lifestyle choices. Eat a variety of fresh fruits and vegetables, beans, nuts, seeds, and whole grains. Limit the amount of red meat and sweets that you eat. Talk with your health care provider about whether it is safe for you to drink red wine in moderation. This means 1 glass a day for nonpregnant women and 2 glasses a day for men. A glass of wine equals 5 oz (150 mL). This information is not intended to replace advice given to you by your health care provider. Make sure you discuss any questions you have with your health care provider. Document Released: 10/23/2015 Document Revised: 11/25/2015 Document Reviewed: 10/23/2015 Elsevier Interactive Patient Education  2017 [employer].   [facility] at [facility] [phone]  Labs today [address] today

## 2023-01-13 NOTE — Progress Notes (Signed)
Virtual Visit via Video Note The purpose of this virtual visit is to provide medical care in a patient that is unable to be seen in person  as she is out of town but in [place]. Visit had to be switched to phone due to technical difficulties.   Consent was obtained for video visit:  yes  Answered questions that patient had about telehealth interaction:  yes I discussed the limitations, risks, security and privacy concerns of performing an evaluation and management service by telemedicine. I also discussed with the patient that there may be a patient responsible charge related to this service. The patient expressed understanding and agreed to proceed.  Pt location: Home Physician Location: office Name of referring provider:  Del York Pellant* I connected with Asencion Partridge Derossett at patients initiation/request on 01/13/2023 at  3:30 PM EDT by video enabled telemedicine application and verified that I am speaking with the correct person using two identifiers. Pt MRN:  [id number] Pt DOB:  03/16/1960 Video Participants:  Asencion Partridge Willden;     Loralye Reffner is a  62 y.o. [facility] female with a history of  hypertension, hyperlipidemia, depression, history of breast cancer on tamoxifen, R kidney cancer, Vit D deficiency, OSA unable to tolerate CPAP seen today in follow up. Last seen on 09/09/22. MoCA at the time was  20/30.MRI brain personally reviewed 08/2022 remarkable for mild chronic small vessel disease, no acute findings, no atrophy. She reports feeling better, with cognitive improvement since her last visit, attributes this change to decreasing alcohol and marijuana and making other changes in lifestyle, eating healthier and being more active, B1 and B12 supplements.    Alcohol and marijuana cessation counseled Continue to control mood by PCP, recommend psychotherapy Continue B1 and B12 supplements Recommend good control of cardiovascular risk factors.   Follow up Feb 4 at  11:30  Update    Any changes in memory?About the same, maybe better, with B12 and B1 repeats oneself? Endorsed, not as much  Disoriented when walking into a room? Denies  Misplacing things? Denies  Ambulates with difficulty? Improved, started to walk more frequently Recent falls? Denies  Any head injuries? Denies Wandering behavior? Denies  Any mood changes?  Denies . Mood is improved Hallucinations?denies  Paranoia?denies  Patient reports that sleeps well without vivid dreams, REM behavior or sleepwalking    History of sleep apnea? She is to arrange appt with pulmonologist for CPAP Any hygiene concerns? Independent of bathing and dressing? Does the patient needs help with medications? Patient is in charge.  pillbox pill pack Any changes in appetite?Eats well, healthier than before Patient have trouble swallowing?Denies Any headaches? Denies  The double vision? Any focal numbness or tingling? Any pain? Denies  Unilateral weakness? Any tremors? Denies  Any incontinence of urine? Any bowel dysfunction?  Constipation     diarrhea Only on the weekends 2-3   A blunt a day of marijuana    Initial visit 09/09/22 How long did patient have memory difficulties?  For the last 3 years, when I started taking tamoxifen with some difficulty remembering recent conversations and people names .I  am making poor decisions, I am "foggy brain". "I gave my mama the wrong medicine".  She states that he enjoys going to church, but does not participate in the interim or different activities that the charge provides.  Patient is about to be on disability due to cancer, and has not been very active at home. repeats oneself? Denies Disoriented when  walking into a room?"I have to focus to see who is in the room so I know where I am".   Leaving objects in unusual places?  She misplaces things and cannot find it.   Wandering behavior?  denies   Any personality changes? Yes, sometimes I get angry, especially  if someone keeps calling my name"  Any history of depression?:  Endorsed, but has not been seen by therapist. "The only thing it helps me is marijuana, relaxes me and can think better".  She reports that she has difficulty in her marriage, and she is considering divorce.  At that point, the patient began to sob.  Hallucinations or paranoia? Endorsed, 2 weeks ago "I was at home watching TV I saw somebody under the couch but was no one there" Seizures?   Patient denies    Any sleep changes? Does not sleep well.  She has OSA, cannot tolerate the CPAP.  Endorse vivid dreams of falling, death in the family, etc, REM behavior or sleepwalking   Any hygiene concerns? "I used to shower more and dress better, but now I don't really care, since tamoxifen".    Independent of bathing and dressing?  Endorsed  Does the patient needs help with medications? Patient is in charge, sometimes she may forget   Who is in charge of the finances? Husband and her mother in charge     Any changes in appetite? I am eating more     Patient have trouble swallowing? denies   Does the patient cook? Yes  Any kitchen accidents such as leaving the stove on? denies   Any headaches?   denies   Chronic back pain ? Endorsed L hip pain, takes gabapentin for pain as per PCP. Has never done PT Ambulates with difficulty?  denies that she admits to being deconditioned Recent falls or head injuries? denies   Vision changes? denies   Unilateral weakness, numbness or tingling? denies   Any tremors?   denies   Any anosmia?  denies   Any incontinence of urine? denies   Any bowel dysfunction?  Both  Patient lives  with husband and her mother  History of heavy alcohol intake?  Drinks 2 large cans of beer  a day  History of heavy tobacco use?  She does not smoke cigarettes, but does marijuana 2 joints a day Family history of dementia? denies  Does patient drive? Yes, denies any issues such as getting lost .   Current Outpatient  Medications on File Prior to Visit  Medication Sig Dispense Refill   amLODipine (NORVASC) 5 MG tablet Take 1 tablet (5 mg total) by mouth daily. 90 tablet 1   benazepril (LOTENSIN) 10 MG tablet Take 1 tablet (10 mg total) by mouth daily. 90 tablet 3   cyanocobalamin (VITAMIN B12) 1000 MCG tablet Take 1,000 mcg by mouth daily.     dicyclomine (BENTYL) 10 MG capsule Take 1 capsule (10 mg total) by mouth 3 (three) times daily before meals. 90 capsule 3   gabapentin (NEURONTIN) 300 MG capsule Take 1 capsule (300 mg total) by mouth 3 (three) times daily as needed. 90 capsule 3   methocarbamol (ROBAXIN) 500 MG tablet Take 1 tablet (500 mg total) by mouth 2 (two) times daily as needed for muscle spasms. 20 tablet 0   naproxen (NAPROSYN) 500 MG tablet Take 1 tablet (500 mg total) by mouth 2 (two) times daily with a meal. 30 tablet 0   omeprazole (PRILOSEC) 40 MG capsule Take  40 mg by mouth every morning.     predniSONE (DELTASONE) 20 MG tablet Take 2 tablets (40 mg total) by mouth daily. 10 tablet 0   tamoxifen (NOLVADEX) 20 MG tablet TAKE 1 TABLET(20 MG) BY MOUTH DAILY 30 tablet 0   Thiamine HCl (VITAMIN B-1) 250 MG tablet Take 250 mg by mouth daily.     TOPAMAX 25 MG tablet Take 1 tablet (25 mg total) by mouth daily. 30 tablet 3   venlafaxine (EFFEXOR) 75 MG tablet Take 1 tablet (75 mg total) by mouth daily. 90 tablet 1   No current facility-administered medications on file prior to visit.     Observations/Objective:   There were no vitals filed for this visit. GEN:  The patient appears stated age and is in NAD.  Neurological examination: Patient is awake, alert, oriented to person, place, date. No aphasia or dysarthria. Intact fluency  recent memory impaired. Able to repeat phrases. Unable to do physical exam as this had to be switched to phone call     Follow Up Instructions:    -I discussed the assessment and treatment plan with the patient. The patient was provided an opportunity to ask  questions and all were answered. The patient agreed with the plan and demonstrated an understanding of the instructions.   The patient was advised to call back or seek an in-person evaluation if the symptoms worsen or if the condition fails to improve as anticipated.    Total time spent on today's visit was , including both face-to-face time and nonface-to-face time.  Time included that spent on review of records (prior notes available to me/labs/imaging if pertinent), discussing treatment and goals, answering patient's questions and coordinating care.   Marlowe Kays, PA-C

## 2023-01-23 IMAGING — CT CT ABD-PELV W/O CM
2 of 4 series · 17 of 46 positions shown, 19 images · non-contrast
Comparison: 12/16/2019

CLINICAL DATA: Diffuse abdominal pain for 1 week.

EXAM:
CT ABDOMEN AND PELVIS WITHOUT CONTRAST
TECHNIQUE: Multidetector CT imaging of the abdomen and pelvis was performed
following the standard protocol without IV contrast.

[Series 2: axial st · axial · 0.69mm/px · z∈[+1182,+1557]mm · 14 of 87 slices shown, 16 images]
[im 6/87  soft-tissue]
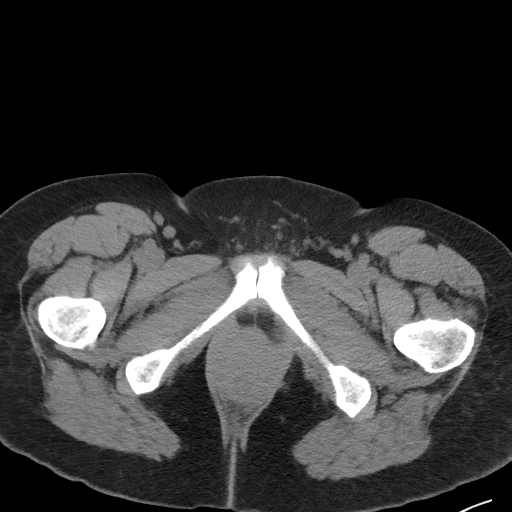
[im 6/87  bone]
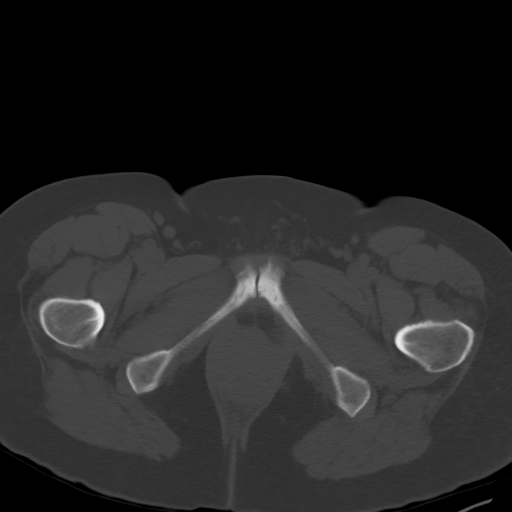
[im 11/87  soft-tissue]
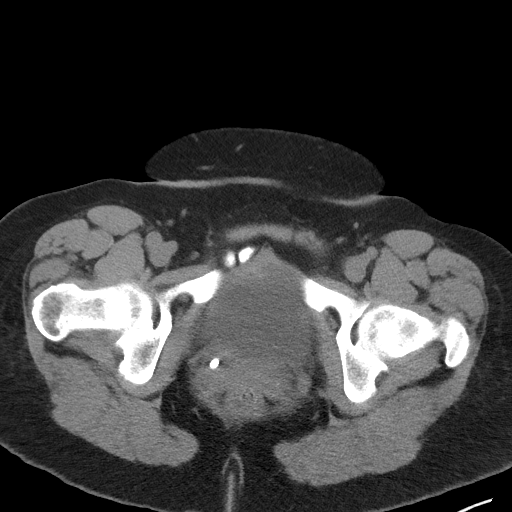
[im 16/87  soft-tissue]
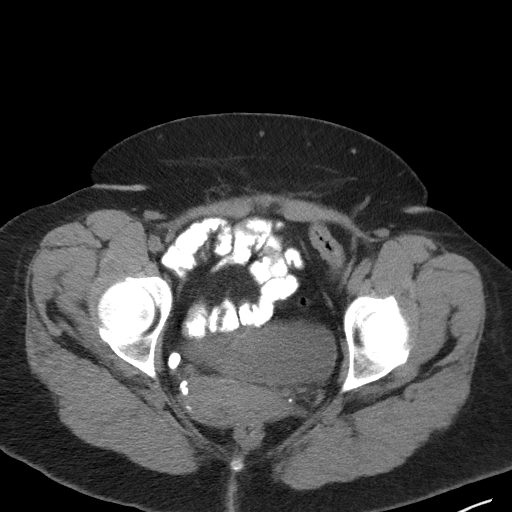
[im 26/87  soft-tissue]
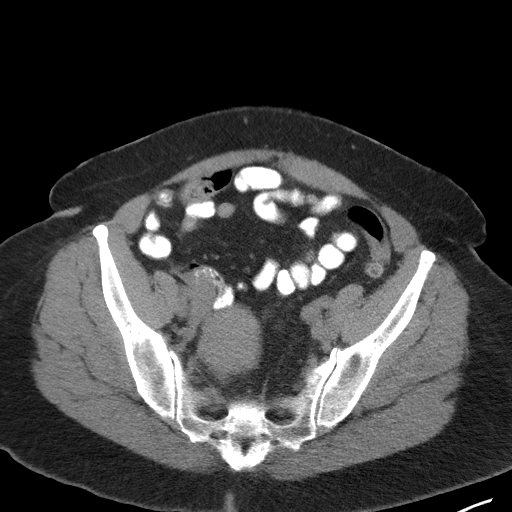
[im 31/87  soft-tissue]
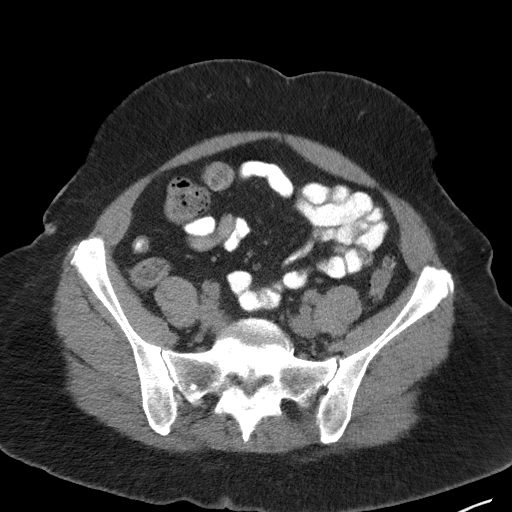
[im 36/87  soft-tissue]
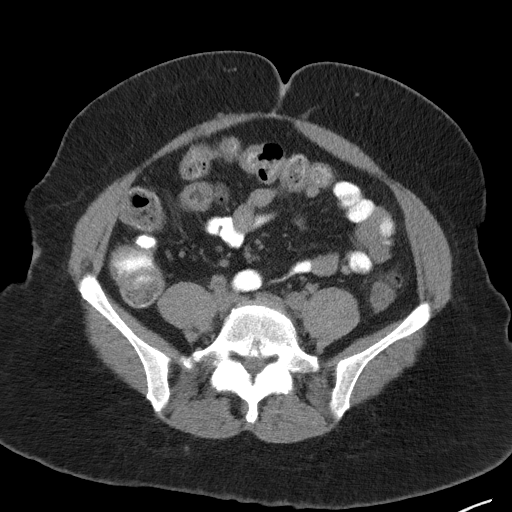
[im 41/87  soft-tissue]
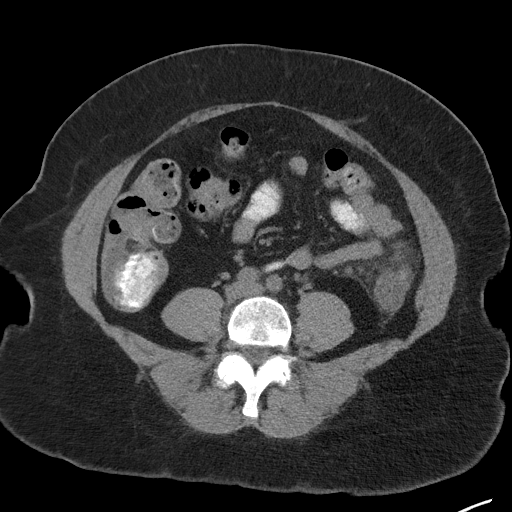
[im 46/87  soft-tissue]
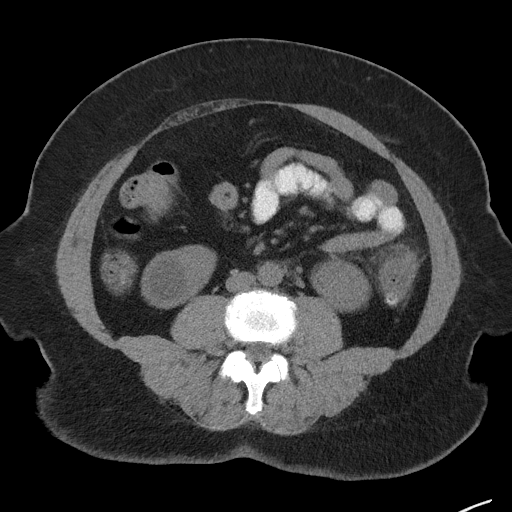
[im 51/87  soft-tissue]
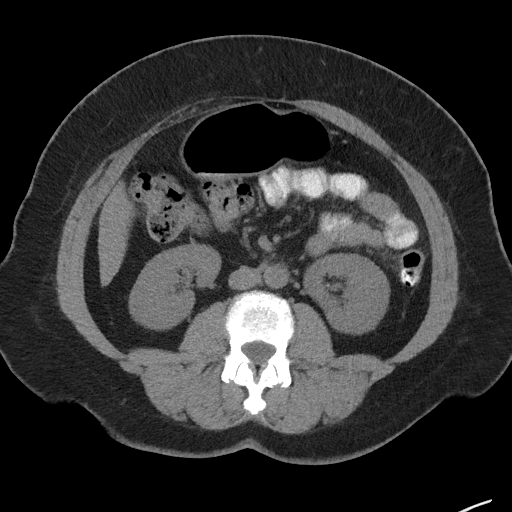
[im 51/87  bone]
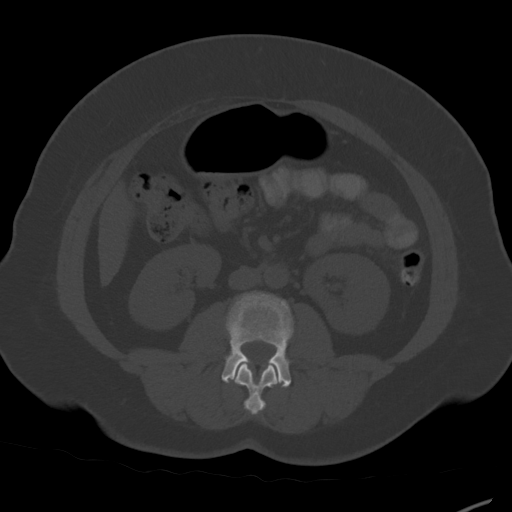
[im 56/87  soft-tissue]
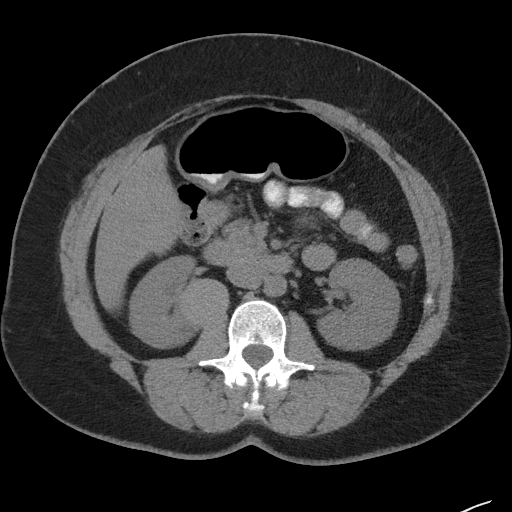
[im 66/87  soft-tissue]
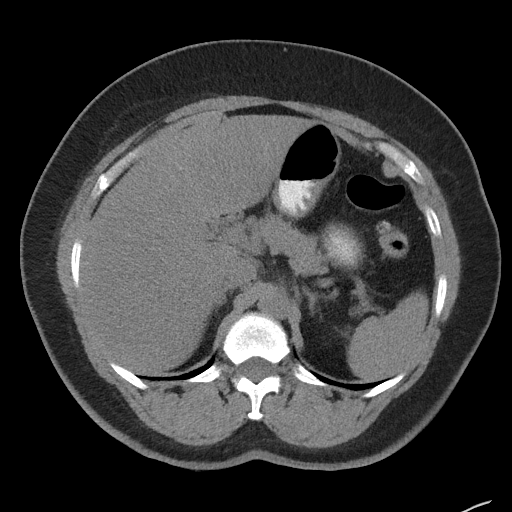
[im 71/87  soft-tissue]
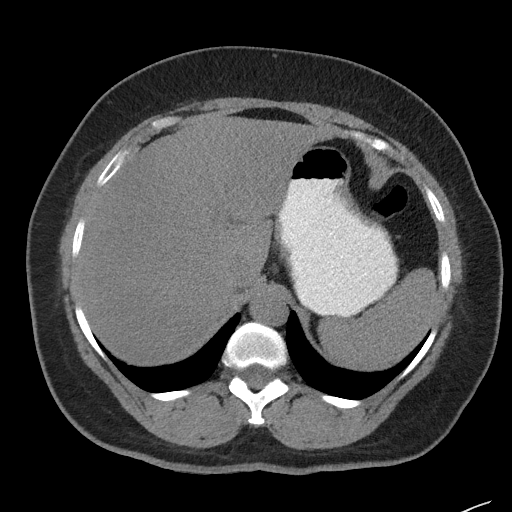
[im 76/87  soft-tissue]
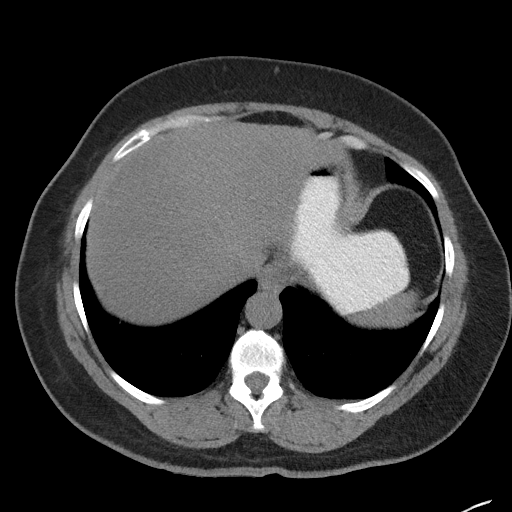
[im 81/87  soft-tissue]
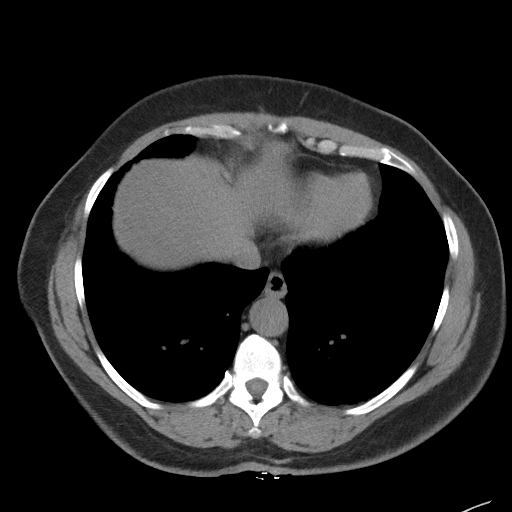

[Series 5: coronal st · coronal · 0.84mm/px · 3 of 98 slices shown]
[im 33/98  soft-tissue]
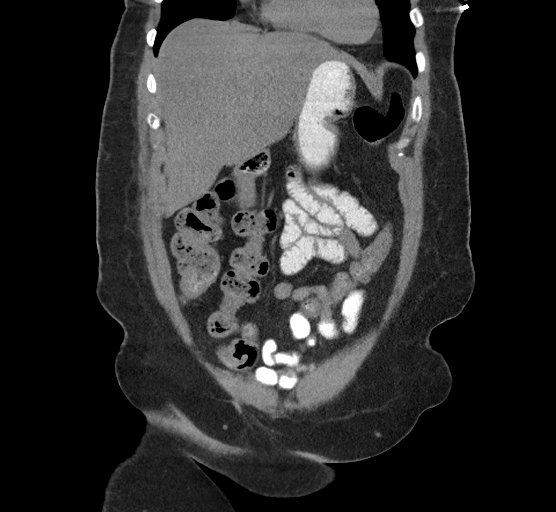
[im 44/98  soft-tissue]
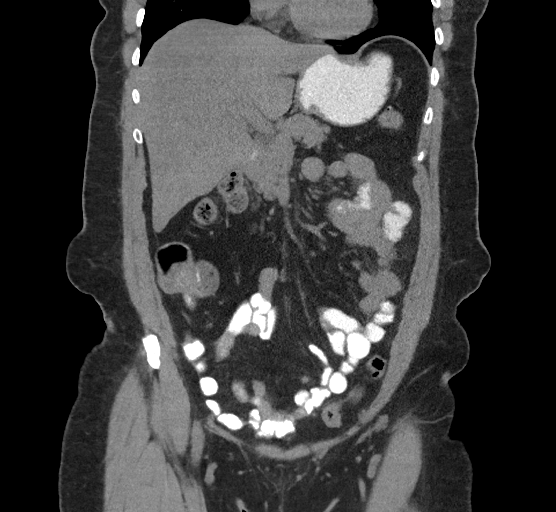
[im 54/98  soft-tissue]
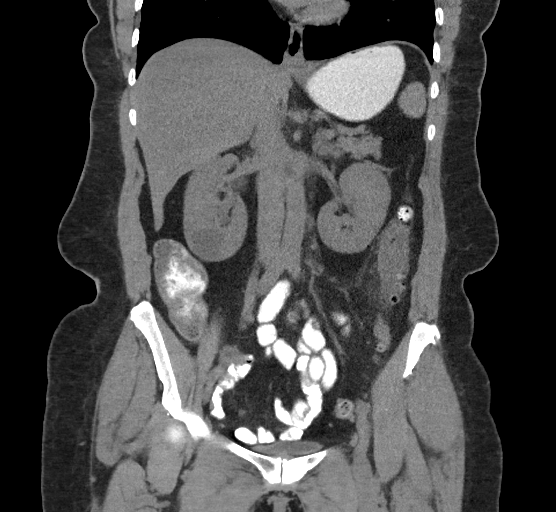

[17 of 46 positions shown; findings below may reference images not displayed]

FINDINGS: Lower chest: No acute findings. Tiny sub-cm pulmonary nodule in
lateral left lower lobe remains stable.

Hepatobiliary: No mass visualized on this unenhanced exam. Mild
diffuse hepatic steatosis is again noted. Prior cholecystectomy. No
evidence of biliary obstruction.

Pancreas: No mass or inflammatory process visualized on this
unenhanced exam.

Spleen:  Within normal limits in size.

Adrenals/Urinary tract: No evidence of urolithiasis or
hydronephrosis. Several renal cysts are again seen as well as a
cm hemorrhagic cyst in the upper pole of the right kidney.
Unremarkable unopacified urinary bladder.

Stomach/Bowel: Mild diverticulitis is seen involving the descending
colon. No evidence of perforation or abscess.

Vascular/Lymphatic: No pathologically enlarged lymph nodes
identified. No evidence of abdominal aortic aneurysm.

Reproductive:  Unremarkable.

Other:  None.

Musculoskeletal:  No suspicious bone lesions identified.
IMPRESSION: Mild diverticulitis involving the descending colon. No evidence of
abscess or other complication.

Mild hepatic steatosis.

## 2023-02-07 ENCOUNTER — Inpatient Hospital Stay (HOSPITAL_COMMUNITY): Admission: RE | Admit: 2023-02-07 | Payer: BC Managed Care – PPO | Source: Ambulatory Visit

## 2023-02-08 ENCOUNTER — Other Ambulatory Visit: Payer: Self-pay

## 2023-02-08 ENCOUNTER — Emergency Department (HOSPITAL_COMMUNITY): Payer: BC Managed Care – PPO

## 2023-02-08 ENCOUNTER — Emergency Department (HOSPITAL_COMMUNITY)
Admission: EM | Admit: 2023-02-08 | Discharge: 2023-02-08 | Disposition: A | Discharge status: Home / Self Care | Payer: BC Managed Care – PPO | Attending: Emergency Medicine | Admitting: Emergency Medicine

## 2023-02-08 ENCOUNTER — Encounter (HOSPITAL_COMMUNITY): Payer: Self-pay

## 2023-02-08 DIAGNOSIS — I10 Essential (primary) hypertension: Secondary | ICD-10-CM | POA: Diagnosis not present

## 2023-02-08 DIAGNOSIS — Z79899 Other long term (current) drug therapy: Secondary | ICD-10-CM | POA: Diagnosis not present

## 2023-02-08 DIAGNOSIS — Z85528 Personal history of other malignant neoplasm of kidney: Secondary | ICD-10-CM | POA: Insufficient documentation

## 2023-02-08 DIAGNOSIS — J069 Acute upper respiratory infection, unspecified: Secondary | ICD-10-CM | POA: Diagnosis not present

## 2023-02-08 DIAGNOSIS — Z85038 Personal history of other malignant neoplasm of large intestine: Secondary | ICD-10-CM | POA: Insufficient documentation

## 2023-02-08 DIAGNOSIS — Z853 Personal history of malignant neoplasm of breast: Secondary | ICD-10-CM | POA: Diagnosis not present

## 2023-02-08 DIAGNOSIS — Z1152 Encounter for screening for COVID-19: Secondary | ICD-10-CM | POA: Insufficient documentation

## 2023-02-08 DIAGNOSIS — R059 Cough, unspecified: Secondary | ICD-10-CM | POA: Diagnosis not present

## 2023-02-08 DIAGNOSIS — B9789 Other viral agents as the cause of diseases classified elsewhere: Secondary | ICD-10-CM | POA: Diagnosis not present

## 2023-02-08 LAB — RESP PANEL BY RT-PCR (RSV, FLU A&B, COVID)  RVPGX2
Influenza A by PCR: NEGATIVE
Influenza B by PCR: NEGATIVE
Resp Syncytial Virus by PCR: NEGATIVE
SARS Coronavirus 2 by RT PCR: NEGATIVE

## 2023-02-08 MED ORDER — ALBUTEROL SULFATE HFA 108 (90 BASE) MCG/ACT IN AERS: 1.0000 | INHALATION_SPRAY | Freq: Four times a day (QID) | RESPIRATORY_TRACT | 0 refills | Status: AC | PRN

## 2023-02-08 MED ORDER — ALBUTEROL SULFATE (2.5 MG/3ML) 0.083% IN NEBU: 2.5000 mg | INHALATION_SOLUTION | Freq: Four times a day (QID) | RESPIRATORY_TRACT | 12 refills | Status: AC | PRN

## 2023-02-08 MED ORDER — IPRATROPIUM-ALBUTEROL 0.5-2.5 (3) MG/3ML IN SOLN
3.0000 mL | Freq: Once | RESPIRATORY_TRACT | Status: AC
  Administered 2023-02-08: 3 mL via RESPIRATORY_TRACT
  Filled 2023-02-08: qty 3

## 2023-02-08 MED ORDER — OXYMETAZOLINE HCL 0.05 % NA SOLN: 1.0000 | Freq: Two times a day (BID) | NASAL | 0 refills | Status: AC

## 2023-02-08 MED ORDER — GUAIFENESIN-DM 100-10 MG/5ML PO SYRP: 5.0000 mL | ORAL_SOLUTION | ORAL | 0 refills | Status: DC | PRN

## 2023-02-08 NOTE — ED Provider Notes (Signed)
[facility]PARTMENT AT [facility] Provider Note  CSN: [id number] Arrival date & time: 02/08/23 4696  Chief Complaint(s) Cough  HPI Jennifer Harvey is a 62 y.o. female with past medical history as below, significant for RCC status post partial right nephrectomy, hypertension, GERD, marijuana use who presents to the ED with complaint of cough, congestion.  She has been having nonproductive cough for the past 3 to 4 days, some postnasal drip, no chest pain or dyspnea.  No nausea or vomiting.  She has been taking NyQuil at home without much relief.  No sick contacts recent travel, no fevers or chills.  No chest pain.  Past Medical History Past Medical History:  Diagnosis Date   Breast cancer (HCC)    Cancer (HCC)    renal cell carcinoma 2007 s/p partial right nephrectomy    History of kidney cancer    Hypertension    Personal history of chemotherapy    Personal history of radiation therapy    Sleep apnea    Patient Active Problem List   Diagnosis Date Noted   GERD (gastroesophageal reflux disease) 11/02/2022   Bloating 07/20/2022   Diarrhea 07/20/2022   Abdominal pain 07/20/2022   Fatty liver 07/20/2022   Palpitations 08/17/2021   Primary hypertension 08/17/2021   Malignant neoplasm of upper-inner quadrant of breast in female, estrogen receptor positive (HCC) 08/19/2017   Home Medication(s) Prior to Admission medications   Medication Sig Start Date End Date Taking? Authorizing Provider  albuterol (PROVENTIL) (2.5 MG/3ML) 0.083% nebulizer solution Take 3 mLs (2.5 mg total) by nebulization every 6 (six) hours as needed for wheezing or shortness of breath. 02/08/23  Yes Tanda Rockers A, DO  albuterol (VENTOLIN HFA) 108 (90 Base) MCG/ACT inhaler Inhale 1-2 puffs into the lungs every 6 (six) hours as needed for wheezing or shortness of breath. 02/08/23  Yes Tanda Rockers A, DO  guaiFENesin-dextromethorphan (ROBITUSSIN DM) 100-10 MG/5ML syrup Take 5 mLs by  mouth every 4 (four) hours as needed for cough. 02/08/23  Yes Tanda Rockers A, DO  oxymetazoline (AFRIN NASAL SPRAY) 0.05 % nasal spray Place 1 spray into both nostrils 2 (two) times daily for 3 days. 02/08/23 02/11/23 Yes Tanda Rockers A, DO  amLODipine (NORVASC) 5 MG tablet Take 1 tablet (5 mg total) by mouth daily. 08/11/22   Del Nigel Berthold, FNP  benazepril (LOTENSIN) 10 MG tablet Take 1 tablet (10 mg total) by mouth daily. 08/11/22   Del Nigel Berthold, FNP  cyanocobalamin (VITAMIN B12) 1000 MCG tablet Take 1,000 mcg by mouth daily.    [provider]  dicyclomine (BENTYL) 10 MG capsule Take 1 capsule (10 mg total) by mouth 3 (three) times daily before meals. 07/20/22   Tiffany Kocher, PA-C  gabapentin (NEURONTIN) 300 MG capsule Take 1 capsule (300 mg total) by mouth 3 (three) times daily as needed. 08/11/22   Del Nigel Berthold, FNP  methocarbamol (ROBAXIN) 500 MG tablet Take 1 tablet (500 mg total) by mouth 2 (two) times daily as needed for muscle spasms. 09/19/22   Eber Hong, MD  naproxen (NAPROSYN) 500 MG tablet Take 1 tablet (500 mg total) by mouth 2 (two) times daily with a meal. 09/19/22   Eber Hong, MD  omeprazole (PRILOSEC) 40 MG capsule Take 40 mg by mouth every morning.    [provider]  predniSONE (DELTASONE) 20 MG tablet Take 2 tablets (40 mg total) by mouth daily. 09/19/22   Eber Hong, MD  tamoxifen (  NOLVADEX) 20 MG tablet TAKE 1 TABLET(20 MG) BY MOUTH DAILY 06/08/22   Doreatha Massed, MD  Thiamine HCl (VITAMIN B-1) 250 MG tablet Take 250 mg by mouth daily.    [provider]  TOPAMAX 25 MG tablet Take 1 tablet (25 mg total) by mouth daily. 08/11/22   Del Nigel Berthold, FNP  venlafaxine (EFFEXOR) 75 MG tablet Take 1 tablet (75 mg total) by mouth daily. 08/11/22   Del Nigel Berthold, FNP                                                                                                                                     Past Surgical History Past Surgical History:  Procedure Laterality Date   BIOPSY  09/21/2022   Procedure: BIOPSY;  Surgeon: Lanelle Bal, DO;  Location: [facility];  Service: Endoscopy;;   BREAST LUMPECTOMY Right 2019   BREAST SURGERY     CESAREAN SECTION  1994   COLONOSCOPY     COLONOSCOPY WITH PROPOFOL N/A 09/21/2022   Procedure: COLONOSCOPY WITH PROPOFOL;  Surgeon: Lanelle Bal, DO;  Location: [facility];  Service: Endoscopy;  Laterality: N/A;  random colon biopsies, 145pm, asa 2   HYSTEROSCOPY WITH D & C N/A 03/12/2020   Procedure: Hysteroscopy Uterine Curettage;  Surgeon: Lazaro Arms, MD;  Location: [facility];  Service: Gynecology;  Laterality: N/A;   HYSTEROSCOPY WITH D & C N/A 11/11/2020   Procedure: DILATATION AND CURETTAGE /HYSTEROSCOPY;  Surgeon: Myna Hidalgo, DO;  Location: [facility];  Service: Gynecology;  Laterality: N/A;   partial right nephrectomy  2007   POLYPECTOMY N/A 11/11/2020   Procedure: Myosure POLYPECTOMY;  Surgeon: Myna Hidalgo, DO;  Location: [facility];  Service: Gynecology;  Laterality: N/A;   POLYPECTOMY  09/21/2022   Procedure: POLYPECTOMY;  Surgeon: Lanelle Bal, DO;  Location: [facility];  Service: Endoscopy;;   Family History Family History  Problem Relation Age of Onset   Other Father        auto accident   Cancer Maternal Grandmother        stomach   Cancer Maternal Grandfather        kidney cancer   Colon cancer Neg Hx     Social History Social History   Tobacco Use   Smoking status: Never   Smokeless tobacco: Never  Vaping Use   Vaping status: Never Used  Substance Use Topics   Alcohol use: Yes    Alcohol/week: 14.0 standard drinks of alcohol    Types: 14 Cans of beer per week    Comment: 2 beers per day   Drug use: Yes    Types: Marijuana    Comment: last used 09/19/22   Allergies Patient has no known allergies.  Review of Systems Review of Systems  Constitutional:  Negative for chills and fever.   Respiratory:  Positive for cough. Negative for chest tightness  and shortness of breath.   Cardiovascular:  Negative for chest pain and palpitations.  Gastrointestinal:  Negative for abdominal pain and nausea.  Genitourinary:  Negative for difficulty urinating.  Skin:  Negative for rash.  All other systems reviewed and are negative.   Physical Exam Vital Signs  I have reviewed the triage vital signs BP (!) 154/98   Pulse 68   Temp 98.3 F (36.8 C)   Resp 13   Ht 5\' 6"  (1.676 m)   Wt 88.5 kg   LMP 04/29/2020 (Within Weeks) Comment: Bleeding  SpO2 93%   BMI 31.47 kg/m  Physical Exam Vitals and nursing note reviewed.  Constitutional:      General: She is not in acute distress.    Appearance: Normal appearance.  HENT:     Head: Normocephalic and atraumatic.     Right Ear: External ear normal.     Left Ear: External ear normal.     Nose: Nose normal.     Mouth/Throat:     Mouth: Mucous membranes are moist.  Eyes:     General: No scleral icterus.       Right eye: No discharge.        Left eye: No discharge.  Cardiovascular:     Rate and Rhythm: Normal rate and regular rhythm.     Pulses: Normal pulses.     Heart sounds: Normal heart sounds.  Pulmonary:     Effort: Pulmonary effort is normal. No respiratory distress.     Breath sounds: Normal breath sounds. No stridor.  Abdominal:     General: Abdomen is flat. There is no distension.     Palpations: Abdomen is soft.     Tenderness: There is no abdominal tenderness.  Musculoskeletal:     Cervical back: No rigidity.     Right lower leg: No edema.     Left lower leg: No edema.  Skin:    General: Skin is warm and dry.     Capillary Refill: Capillary refill takes less than 2 seconds.  Neurological:     Mental Status: She is alert.  Psychiatric:        Mood and Affect: Mood normal.        Behavior: Behavior normal. Behavior is cooperative.     ED Results and Treatments Labs (all labs ordered are listed, but only  abnormal results are displayed) Labs Reviewed  RESP PANEL BY RT-PCR (RSV, FLU A&B, COVID)  RVPGX2                                                                                                                          Radiology DG Chest 2 View  Result Date: 02/08/2023 CLINICAL DATA:  cough EXAM: CHEST - 2 VIEW COMPARISON:  CXR 05/06/21 FINDINGS: No pleural effusion. No pneumothorax. No focal airspace opacity. Normal cardiac and mediastinal contours. No radiographically apparent displaced rib fractures. Visualized upper abdomen is unremarkable. Surgical clips in the right axilla. Vertebral body  heights are maintained. IMPRESSION: No focal airspace opacity. Electronically Signed   By: Lorenza Cambridge M.D.   On: 02/08/2023 08:54    Pertinent labs & imaging results that were available during my care of the patient were reviewed by me and considered in my medical decision making (see MDM for details).  Medications Ordered in ED Medications  ipratropium-albuterol (DUONEB) 0.5-2.5 (3) MG/3ML nebulizer solution 3 mL (3 mLs Nebulization Given 02/08/23 0102)                                                                                                                                     Procedures Procedures  (including critical care time)  Medical Decision Making / ED Course    Medical Decision Making:    Romya Fincke is a 62 y.o. female with past medical history as below, significant for RCC status post partial right nephrectomy, hypertension, GERD, marijuana use who presents to the ED with complaint of cough, congestion.. The complaint involves an extensive differential diagnosis and also carries with it a high risk of complications and morbidity.  Serious etiology was considered. Ddx includes but is not limited to: URI, COVID-19, influenza, bronchitis, pneumonia, etc.  Complete initial physical exam performed, notably the patient was in no acute distress, no hypoxia.    Reviewed and  confirmed nursing documentation for past medical history, family history, social history.  Vital signs reviewed.    Cough last few days, get chest x-ray, EKG, repeat, give DuoNeb  Clinical Course as of 02/08/23 1039  Tue Feb 08, 2023  7253 Denies nausea or dib, no cp [SG]  0931 CXR stable, EKG w/o acute ischemic changes [SG]  0931 Symptoms improved with neb [SG]  1012 Feeling better [SG]    Clinical Course User Index [SG] Sloan Leiter, DO    Brief summary: 62 year old female here with cough, exam is stable.  Vital signs stable.  No hypoxia.  No chest pain or dyspnea.  Chest x-ray stable, RVP was negative, EKG without acute ischemic changes, symptoms greatly improved following DuoNeb.  Favor likely URI or viral syndrome.  Recommend she abstain from [facility] smoking, she will consider switching to edibles.  Follow-up PCP was encouraged  The patient improved significantly and was discharged in stable condition. Detailed discussions were had with the patient regarding current findings, and need for close f/u with PCP or on call doctor. The patient has been instructed to return immediately if the symptoms worsen in any way for re-evaluation. Patient verbalized understanding and is in agreement with current care plan. All questions answered prior to discharge.                  Additional history obtained: -Additional history obtained from na -External records from outside source obtained and reviewed including: Chart review including previous notes, labs, imaging, consultation notes including  Primary care documentation Home medications   Lab Tests: -I ordered,  reviewed, and interpreted labs.   The pertinent results include:   Labs Reviewed  RESP PANEL BY RT-PCR (RSV, FLU A&B, COVID)  RVPGX2    Notable for RVP was negative  EKG   EKG Interpretation Date/Time:  Tuesday February 08 2023 08:48:10 EST Ventricular Rate:  68 PR Interval:  149 QRS Duration:  95 QT  Interval:  425 QTC Calculation: 452 R Axis:   36  Text Interpretation: Sinus rhythm Confirmed by Tanda Rockers ([id number]) on 02/08/2023 9:51:05 AM         Imaging Studies ordered: I ordered imaging studies including c x-ray I independently visualized the following imaging with scope of interpretation limited to determining acute life threatening conditions related to emergency care; findings noted above I independently visualized and interpreted imaging. I agree with the radiologist interpretation   Medicines ordered and prescription drug management: Meds ordered this encounter  Medications   ipratropium-albuterol (DUONEB) 0.5-2.5 (3) MG/3ML nebulizer solution 3 mL   albuterol (VENTOLIN HFA) 108 (90 Base) MCG/ACT inhaler    Sig: Inhale 1-2 puffs into the lungs every 6 (six) hours as needed for wheezing or shortness of breath.    Dispense:  1 each    Refill:  0   albuterol (PROVENTIL) (2.5 MG/3ML) 0.083% nebulizer solution    Sig: Take 3 mLs (2.5 mg total) by nebulization every 6 (six) hours as needed for wheezing or shortness of breath.    Dispense:  75 mL    Refill:  12   guaiFENesin-dextromethorphan (ROBITUSSIN DM) 100-10 MG/5ML syrup    Sig: Take 5 mLs by mouth every 4 (four) hours as needed for cough.    Dispense:  118 mL    Refill:  0   oxymetazoline (AFRIN NASAL SPRAY) 0.05 % nasal spray    Sig: Place 1 spray into both nostrils 2 (two) times daily for 3 days.    Dispense:  15 mL    Refill:  0    -I have reviewed the patients home medicines and have made adjustments as needed   Consultations Obtained: na   Cardiac Monitoring: The patient was maintained on a cardiac monitor.  I personally viewed and interpreted the cardiac monitored which showed an underlying rhythm of: NSR Continuous pulse oximetry interpreted by myself, 99% on RA.    Social Determinants of Health:  Diagnosis or treatment significantly limited by social determinants of health: THC  smoking   Reevaluation: After the interventions noted above, I reevaluated the patient and found that they have resolved  Co morbidities that complicate the patient evaluation  Past Medical History:  Diagnosis Date   Breast cancer (HCC)    Cancer (HCC)    renal cell carcinoma 2007 s/p partial right nephrectomy    History of kidney cancer    Hypertension    Personal history of chemotherapy    Personal history of radiation therapy    Sleep apnea       Dispostion: Disposition decision including need for hospitalization was considered, and patient discharged from emergency department.    Final Clinical Impression(s) / ED Diagnoses Final diagnoses:  Viral URI with cough        Sloan Leiter, DO 02/08/23 1039

## 2023-02-08 NOTE — ED Triage Notes (Signed)
Pt c/o cough and congestion for the past 4 days. Pt denies fever or vomiting just some nausea. Pt states she is unable to cough anything up. Pt states taking Nyquil with no relief.

## 2023-02-08 NOTE — Discharge Instructions (Addendum)
It was a pleasure caring for you today in the emergency department.  Please consider smoking cessation  Consider using humidifier in bedroom  Follow-up with PCP  Please return to the emergency department for any worsening or worrisome symptoms.

## 2023-02-16 ENCOUNTER — Other Ambulatory Visit: Payer: Self-pay

## 2023-02-17 ENCOUNTER — Encounter: Payer: Self-pay | Admitting: Obstetrics & Gynecology

## 2023-02-17 ENCOUNTER — Inpatient Hospital Stay: Payer: BC Managed Care – PPO | Attending: Hematology

## 2023-02-17 ENCOUNTER — Ambulatory Visit: Payer: BC Managed Care – PPO | Admitting: Obstetrics & Gynecology

## 2023-02-17 ENCOUNTER — Other Ambulatory Visit (HOSPITAL_COMMUNITY)
Admission: RE | Admit: 2023-02-17 | Discharge: 2023-02-17 | Disposition: A | Discharge status: Home / Self Care | Payer: BC Managed Care – PPO | Source: Ambulatory Visit | Attending: Obstetrics & Gynecology | Admitting: Obstetrics & Gynecology

## 2023-02-17 VITALS — BP 142/83 | HR 84 | Ht 66.0 in | Wt 201.0 lb

## 2023-02-17 DIAGNOSIS — Z01419 Encounter for gynecological examination (general) (routine) without abnormal findings: Secondary | ICD-10-CM | POA: Insufficient documentation

## 2023-02-17 DIAGNOSIS — C50211 Malignant neoplasm of upper-inner quadrant of right female breast: Secondary | ICD-10-CM | POA: Insufficient documentation

## 2023-02-17 DIAGNOSIS — E559 Vitamin D deficiency, unspecified: Secondary | ICD-10-CM | POA: Diagnosis not present

## 2023-02-17 DIAGNOSIS — Z85528 Personal history of other malignant neoplasm of kidney: Secondary | ICD-10-CM | POA: Diagnosis not present

## 2023-02-17 DIAGNOSIS — Z923 Personal history of irradiation: Secondary | ICD-10-CM | POA: Diagnosis not present

## 2023-02-17 DIAGNOSIS — Z1151 Encounter for screening for human papillomavirus (HPV): Secondary | ICD-10-CM | POA: Diagnosis not present

## 2023-02-17 DIAGNOSIS — Z79810 Long term (current) use of selective estrogen receptor modulators (SERMs): Secondary | ICD-10-CM | POA: Insufficient documentation

## 2023-02-17 DIAGNOSIS — Z170 Estrogen receptor positive status [ER+]: Secondary | ICD-10-CM | POA: Insufficient documentation

## 2023-02-17 DIAGNOSIS — Z801 Family history of malignant neoplasm of trachea, bronchus and lung: Secondary | ICD-10-CM | POA: Diagnosis not present

## 2023-02-17 DIAGNOSIS — N951 Menopausal and female climacteric states: Secondary | ICD-10-CM | POA: Insufficient documentation

## 2023-02-17 DIAGNOSIS — Z9221 Personal history of antineoplastic chemotherapy: Secondary | ICD-10-CM | POA: Insufficient documentation

## 2023-02-17 LAB — CBC WITH DIFFERENTIAL/PLATELET
Abs Immature Granulocytes: 0.01 10*3/uL (ref 0.00–0.07)
Basophils Absolute: 0 10*3/uL (ref 0.0–0.1)
Basophils Relative: 1 %
Eosinophils Absolute: 0.1 10*3/uL (ref 0.0–0.5)
Eosinophils Relative: 3 %
HCT: 36.9 % (ref 36.0–46.0)
Hemoglobin: 12.4 g/dL (ref 12.0–15.0)
Immature Granulocytes: 0 %
Lymphocytes Relative: 48 %
Lymphs Abs: 1.8 10*3/uL (ref 0.7–4.0)
MCH: 29.9 pg (ref 26.0–34.0)
MCHC: 33.6 g/dL (ref 30.0–36.0)
MCV: 88.9 fL (ref 80.0–100.0)
Monocytes Absolute: 0.2 10*3/uL (ref 0.1–1.0)
Monocytes Relative: 6 %
Neutro Abs: 1.6 10*3/uL — ABNORMAL LOW (ref 1.7–7.7)
Neutrophils Relative %: 42 %
Platelets: 249 10*3/uL (ref 150–400)
RBC: 4.15 MIL/uL (ref 3.87–5.11)
RDW: 13.4 % (ref 11.5–15.5)
WBC: 3.7 10*3/uL — ABNORMAL LOW (ref 4.0–10.5)
nRBC: 0 % (ref 0.0–0.2)

## 2023-02-17 LAB — COMPREHENSIVE METABOLIC PANEL
ALT: 21 U/L (ref 0–44)
AST: 22 U/L (ref 15–41)
Albumin: 3.7 g/dL (ref 3.5–5.0)
Alkaline Phosphatase: 79 U/L (ref 38–126)
Anion gap: 8 (ref 5–15)
BUN: 10 mg/dL (ref 6–20)
CO2: 29 mmol/L (ref 22–32)
Calcium: 9.1 mg/dL (ref 8.9–10.3)
Chloride: 105 mmol/L (ref 98–111)
Creatinine, Ser: 0.59 mg/dL (ref 0.44–1.00)
GFR, Estimated: >60 mL/min (ref >60)
Glucose, Bld: 141 mg/dL — ABNORMAL HIGH (ref 70–99)
Potassium: 3.3 mmol/L — ABNORMAL LOW (ref 3.5–5.1)
Sodium: 142 mmol/L (ref 135–145)
Total Bilirubin: 0.3 mg/dL (ref <1.2)
Total Protein: 6.8 g/dL (ref 6.5–8.1)

## 2023-02-17 LAB — VITAMIN D 25 HYDROXY (VIT D DEFICIENCY, FRACTURES): Vit D, 25-Hydroxy: 9.45 ng/mL — ABNORMAL LOW (ref 30–100)

## 2023-02-17 MED ORDER — VEOZAH 45 MG PO TABS: 1.0000 | ORAL_TABLET | Freq: Every day | ORAL | Status: DC

## 2023-02-17 NOTE — Progress Notes (Unsigned)
Subjective:     Jennifer Harvey is a 62 y.o. female here for a routine exam.  Patient's last menstrual period was 04/29/2020 (within weeks). G4P0 Birth Control Method:  menopausal Menstrual Calendar(currently): amenorrhea  Current complaints: none.   Current acute medical issues:  breast cancer, RCC   Recent Gynecologic History Patient's last menstrual period was 04/29/2020 (within weeks). Last Pap: 2022,  normal Last mammogram: 11/23,  normal  Past Medical History:  Diagnosis Date   Breast cancer (HCC)    Cancer (HCC)    renal cell carcinoma 2007 s/p partial right nephrectomy    History of kidney cancer    Hypertension    Personal history of chemotherapy    Personal history of radiation therapy    Sleep apnea     Past Surgical History:  Procedure Laterality Date   BIOPSY  09/21/2022   Procedure: BIOPSY;  Surgeon: Lanelle Bal, DO;  Location: [facility];  Service: Endoscopy;;   BREAST LUMPECTOMY Right 2019   BREAST SURGERY     CESAREAN SECTION  1994   COLONOSCOPY     COLONOSCOPY WITH PROPOFOL N/A 09/21/2022   Procedure: COLONOSCOPY WITH PROPOFOL;  Surgeon: Lanelle Bal, DO;  Location: [facility];  Service: Endoscopy;  Laterality: N/A;  random colon biopsies, 145pm, asa 2   HYSTEROSCOPY WITH D & C N/A 03/12/2020   Procedure: Hysteroscopy Uterine Curettage;  Surgeon: Lazaro Arms, MD;  Location: [facility];  Service: Gynecology;  Laterality: N/A;   HYSTEROSCOPY WITH D & C N/A 11/11/2020   Procedure: DILATATION AND CURETTAGE /HYSTEROSCOPY;  Surgeon: Myna Hidalgo, DO;  Location: [facility];  Service: Gynecology;  Laterality: N/A;   partial right nephrectomy  2007   POLYPECTOMY N/A 11/11/2020   Procedure: Myosure POLYPECTOMY;  Surgeon: Myna Hidalgo, DO;  Location: [facility];  Service: Gynecology;  Laterality: N/A;   POLYPECTOMY  09/21/2022   Procedure: POLYPECTOMY;  Surgeon: Lanelle Bal, DO;  Location: [facility];  Service: Endoscopy;;    OB History      Gravida  4   Para      Term      Preterm      AB      Living  3      SAB      IAB      Ectopic      Multiple      Live Births  3           Social History   Socioeconomic History   Marital status: Married    Spouse name: Not on file   Number of children: 4   Years of education: 10   Highest education level: Not on file  Occupational History   Occupation: makes cookies    Employer: [employer]  Tobacco Use   Smoking status: Never   Smokeless tobacco: Never  Vaping Use   Vaping status: Never Used  Substance and Sexual Activity   Alcohol use: Yes    Alcohol/week: 14.0 standard drinks of alcohol    Types: 14 Cans of beer per week    Comment: 2 beers per day   Drug use: Yes    Types: Marijuana    Comment: last used 09/19/22   Sexual activity: Not Currently    Birth control/protection: Post-menopausal  Other Topics Concern   Not on file  Social History Narrative   Left handed   One floor home    Lives with husband and her mom  No caffeine   Not employed at this time   Social Determinants of Health   Financial Resource Strain: High Risk (02/16/2022)   Overall Financial Resource Strain (CARDIA)    Difficulty of Paying Living Expenses: Hard  Food Insecurity: Food Insecurity Present (02/16/2022)   Hunger Vital Sign    Worried About Running Out of Food in the Last Year: Never true    Ran Out of Food in the Last Year: Sometimes true  Transportation Needs: No Transportation Needs (02/16/2022)   PRAPARE - Administrator, Civil Service (Medical): No    Lack of Transportation (Non-Medical): No  Physical Activity: Inactive (02/16/2022)   Exercise Vital Sign    Days of Exercise per Week: 0 days    Minutes of Exercise per Session: 0 min  Stress: No Stress Concern Present (02/16/2022)   [employer] of Occupational Health - Occupational Stress Questionnaire    Feeling of Stress : Only a little  Social Connections: Moderately Integrated (02/16/2022)    Social Connection and Isolation Panel [NHANES]    Frequency of Communication with Friends and Family: More than three times a week    Frequency of Social Gatherings with Friends and Family: Once a week    Attends Religious Services: 1 to 4 times per year    Active Member of [employer] or Organizations: No    Attends Engineer, structural: Never    Marital Status: Married    Family History  Problem Relation Age of Onset   Other Father        auto accident   Cancer Maternal Grandmother        stomach   Cancer Maternal Grandfather        kidney cancer   Colon cancer Neg Hx      Current Outpatient Medications:    albuterol (VENTOLIN HFA) 108 (90 Base) MCG/ACT inhaler, Inhale 1-2 puffs into the lungs every 6 (six) hours as needed for wheezing or shortness of breath., Disp: 1 each, Rfl: 0   benazepril (LOTENSIN) 10 MG tablet, Take 1 tablet (10 mg total) by mouth daily., Disp: 90 tablet, Rfl: 3   cyanocobalamin (VITAMIN B12) 1000 MCG tablet, Take 1,000 mcg by mouth daily., Disp: , Rfl:    dicyclomine (BENTYL) 10 MG capsule, Take 1 capsule (10 mg total) by mouth 3 (three) times daily before meals., Disp: 90 capsule, Rfl: 3   Fezolinetant (VEOZAH) 45 MG TABS, Take 1 tablet (45 mg total) by mouth at bedtime., Disp: , Rfl:    gabapentin (NEURONTIN) 300 MG capsule, Take 1 capsule (300 mg total) by mouth 3 (three) times daily as needed., Disp: 90 capsule, Rfl: 3   methocarbamol (ROBAXIN) 500 MG tablet, Take 1 tablet (500 mg total) by mouth 2 (two) times daily as needed for muscle spasms., Disp: 20 tablet, Rfl: 0   naproxen (NAPROSYN) 500 MG tablet, Take 1 tablet (500 mg total) by mouth 2 (two) times daily with a meal., Disp: 30 tablet, Rfl: 0   omeprazole (PRILOSEC) 40 MG capsule, Take 40 mg by mouth every morning., Disp: , Rfl:    tamoxifen (NOLVADEX) 20 MG tablet, TAKE 1 TABLET(20 MG) BY MOUTH DAILY, Disp: 30 tablet, Rfl: 0   Thiamine HCl (VITAMIN B-1) 250 MG tablet, Take 250 mg by mouth  daily., Disp: , Rfl:    albuterol (PROVENTIL) (2.5 MG/3ML) 0.083% nebulizer solution, Take 3 mLs (2.5 mg total) by nebulization every 6 (six) hours as needed for wheezing or shortness of breath. (  Patient not taking: Reported on 02/17/2023), Disp: 75 mL, Rfl: 12   amLODipine (NORVASC) 5 MG tablet, Take 1 tablet (5 mg total) by mouth daily. (Patient not taking: Reported on 02/17/2023), Disp: 90 tablet, Rfl: 1   TOPAMAX 25 MG tablet, Take 1 tablet (25 mg total) by mouth daily. (Patient not taking: Reported on 02/17/2023), Disp: 30 tablet, Rfl: 3   venlafaxine (EFFEXOR) 75 MG tablet, Take 1 tablet (75 mg total) by mouth daily. (Patient not taking: Reported on 02/17/2023), Disp: 90 tablet, Rfl: 1  Review of Systems  Review of Systems  Constitutional: Negative for fever, chills, weight loss, malaise/fatigue and diaphoresis.  HENT: Negative for hearing loss, ear pain, nosebleeds, congestion, sore throat, neck pain, tinnitus and ear discharge.   Eyes: Negative for blurred vision, double vision, photophobia, pain, discharge and redness.  Respiratory: Negative for cough, hemoptysis, sputum production, shortness of breath, wheezing and stridor.   Cardiovascular: Negative for chest pain, palpitations, orthopnea, claudication, leg swelling and PND.  Gastrointestinal: negative for abdominal pain. Negative for heartburn, nausea, vomiting, diarrhea, constipation, blood in stool and melena.  Genitourinary: Negative for dysuria, urgency, frequency, hematuria and flank pain.  Musculoskeletal: Negative for myalgias, back pain, joint pain and falls.  Skin: Negative for itching and rash.  Neurological: Negative for dizziness, tingling, tremors, sensory change, speech change, focal weakness, seizures, loss of consciousness, weakness and headaches.  Endo/Heme/Allergies: Negative for environmental allergies and polydipsia. Does not bruise/bleed easily.  Psychiatric/Behavioral: Negative for depression, suicidal ideas,  hallucinations, memory loss and substance abuse. The patient is not nervous/anxious and does not have insomnia.        Objective:  Blood pressure (!) 142/83, pulse 84, height 5\' 6"  (1.676 m), weight 201 lb (91.2 kg), last menstrual period 04/29/2020.   Physical Exam  Vitals reviewed. Constitutional: She is oriented to person, place, and time. She appears well-developed and well-nourished.  HENT:  Head: Normocephalic and atraumatic.        Right Ear: External ear normal.  Left Ear: External ear normal.  Nose: Nose normal.  Mouth/Throat: Oropharynx is clear and moist.  Eyes: Conjunctivae and EOM are normal. Pupils are equal, round, and reactive to light. Right eye exhibits no discharge. Left eye exhibits no discharge. No scleral icterus.  Neck: Normal range of motion. Neck supple. No tracheal deviation present. No thyromegaly present.  Cardiovascular: Normal rate, regular rhythm, normal heart sounds and intact distal pulses.  Exam reveals no gallop and no friction rub.   No murmur heard. Respiratory: Effort normal and breath sounds normal. No respiratory distress. She has no wheezes. She has no rales. She exhibits no tenderness.  GI: Soft. Bowel sounds are normal. She exhibits no distension and no mass. There is no tenderness. There is no rebound and no guarding.  Genitourinary:  Breasts no masses skin changes or nipple changes bilaterally      Vulva is normal without lesions Vagina is pink moist without discharge Cervix normal in appearance and pap is done Uterus is normal size shape and contour Adnexa is negative with normal sized ovaries   Musculoskeletal: Normal range of motion. She exhibits no edema and no tenderness.  Neurological: She is alert and oriented to person, place, and time. She has normal reflexes. She displays normal reflexes. No cranial nerve deficit. She exhibits normal muscle tone. Coordination normal.  Skin: Skin is warm and dry. No rash noted. No erythema. No  pallor.  Psychiatric: She has a normal mood and affect. Her behavior is normal. Judgment  and thought content normal.       Medications Ordered at today's visit: Meds ordered this encounter  Medications   Fezolinetant (VEOZAH) 45 MG TABS    Sig: Take 1 tablet (45 mg total) by mouth at bedtime.    Other orders placed at today's visit: No orders of the defined types were placed in this encounter.    ASSESSMENT + PLAN:    ICD-10-CM   1. Well woman exam with routine gynecological exam  Z01.419     2. Encounter for gynecological examination with Papanicolaou smear of cervix  Z01.419 Cytology - PAP( [facility])    3. Vasomotor symptoms due to menopause  N95.1    4 samples veozah given          Return in about 1 year (around 02/17/2024) for Follow up, with Dr Despina Hidden.

## 2023-02-21 LAB — CYTOLOGY - PAP
Comment: NEGATIVE
Diagnosis: NEGATIVE
High risk HPV: NEGATIVE

## 2023-02-23 NOTE — Progress Notes (Signed)
[facility] [address] [place] [address]    Clinic Day:  02/24/2023  Referring physician: Wylene Men*  Patient Care Team: Del Newman Nip, Tenna Child, FNP as PCP - General (Family Medicine) Doreatha Massed, MD as Medical Oncologist (Medical Oncology)   ASSESSMENT & PLAN:   Assessment: 1. Stage I (T1CN0) right breast IDC, ER 3+, PR 3+, HER2 negative: - 08/03/2017: Right breast UIQ biopsy: Invasive lobular carcinoma, ER 100%, PR 20%, Ki-67 20%, HER2 negative. - Lumpectomy was done in [place].  1.7 cm PT1CN0 infiltrating ductal carcinoma ER 3+, PR 3+, HER2 negative. - Status post XRT in [place]. - Tamoxifen started in December 2019.  LMP was in October 2019.   2. Social/family history: - She lives at home with her husband.  She sits with elderly people, works for [employer]. - Maternal grandmother had stomach cancer.  Maternal uncle had lung cancer.    Plan: 1.  Stage I (T1c N0) right breast IDC, ER 3+, PR 3+, HER2 negative: - She is tolerating tamoxifen very well. - BCI test did not show any benefit in extended antiestrogen therapy. - Last mammogram was in November 2023.  She missed her mammogram this year. - Reviewed labs today: Normal LFTs.  CBC grossly normal. - We will schedule her for mammogram today. - We will see her back in 1 year for follow-up with repeat mammogram and labs.   2.  Right kidney cancer: - Diagnosed in 2007, status post right partial nephrectomy. - CTAP on 06/22/2022: Bilateral renal cysts including hemorrhagic cyst in the medial right kidney.  Mild diffuse hepatic steatosis.   3.  Vasomotor symptoms: - She is on Effexor 75 mg daily and was recently started on Veozah which is helping her flashes.  She reports having nausea when she takes it in the morning.  She was told to take it in the evening. - She will stop tamoxifen end of the year and will likely not need Veozah.   4.  Bone health: - Last  DEXA scan on 02/03/2022 with T-score -0.3. - She is not on vitamin D supplements.  Vitamin D level is 9.4. - Will start her on vitamin D 50,000 units weekly.  After finishing prescription, she was told to take vitamin D 2000 units daily.  Will check at next visit.    Orders Placed This Encounter  Procedures   MM 3D SCREENING MAMMOGRAM BILATERAL BREAST    Standing Status:   Future    Number of Occurrences:   1    Expected Date:   02/21/2024    Expiration Date:   02/24/2024    Reason for Exam (SYMPTOM  OR DIAGNOSIS REQUIRED):   breast cancer screening    Is the patient pregnant?:   No    Preferred imaging location?:   [facility],acting as a scribe for Doreatha Massed, MD.,have documented all relevant documentation on the behalf of Doreatha Massed, MD,as directed by  Doreatha Massed, MD while in the presence of Doreatha Massed, MD.  I, Doreatha Massed MD, have reviewed the above documentation for accuracy and completeness, and I agree with the above.    Doreatha Massed, MD   12/12/20245:52 PM  CHIEF COMPLAINT:   Diagnosis: right breast cancer    Cancer Staging  Malignant neoplasm of upper-inner quadrant of breast in female, estrogen receptor positive (HCC) Staging form: Breast, AJCC 8th Edition - Clinical stage from 08/19/2017:  cT1c, cN0, cM0, ER+, PR+, HER2- - Signed by Pollyann Samples, NP on 08/19/2017 - Pathologic: No stage assigned - Unsigned    Prior Therapy: 1. Right partial nephrectomy in 2007 for kidney cancer 2.  Right breast lumpectomy in June 2019 3.  Radiation therapy done in [place]  Current Therapy:  Tamoxifen    HISTORY OF PRESENT ILLNESS:   Oncology History  Malignant neoplasm of upper-inner quadrant of breast in female, estrogen receptor positive (HCC)  06/07/2017 Mammogram   BILAT SCREENING MAMMOGRAM  There is an irregular focal asymmetry or spiculated mass in the upper inner right breast middle  third, located 7-8 cm from the nipple with a single punctate calcification posteriorly.  Additionally there is a small irregular mass or focal asymmetry more anteriorly in the inner central right breast, 5 cm from the nipple.  Between these 2 masses there is a stable grouping of calcifications.  Left breast is negative   06/20/2017 Mammogram   Right unilateral diagnostic mammogram: Redemonstrated irregular masslike focal asymmetry with architectural distortion in the upper inner breast with an associated punctate calcification measuring 1.1 cm.  Anterior to this is a more subtle area of focal architectural distortion.  Overall the area spans approximately 4 cm   06/20/2017 Breast US   Targeted ultrasound of the right breast reveals at 2 o'clock position 3-4 cm from the nipple there is a subtle irregular hypoechoic area with shadowing measuring 8 mm.  Incidentally noted at 3 o'clock position 6 cm from the nipple is an oval well-circumscribed hypoechoic mass measuring 1.6 x 0.8 x 1.1 cm   06/30/2017 Initial Biopsy   [place] needle core biopsy:  Diagnosis: Fibrotic tissue with increased adenosis, no tumor seen. Immunostains returned.  All glandular elements in the core biopsies are positive for CK 5/6, high molecular weight cytokeratin and E-Cadherin.  There is no focal increase in proliferation with a Ki-67 stain.  Both p63 and smooth muscle myosin exhibit intact basilar and myoepithelial layers surrounding all glandular elements in the core biopsy sections.  These findings support a benign diagnosis.   07/12/2017 Mammogram   Diagnostic mammogram, post biopsy clip imaging: Biopsy clip is approximately 1.3 cm medial to the suspicious mass which was biopsied on [place]. There is a persistent asymmetry at the site of concern in the medial right breast middle depth. Further biopsy was recommended    08/03/2017 Pathology Results   Diagnosis 1. Breast, right, needle core biopsy, UIQ - INVASIVE MAMMARY CARCINOMA. -  MAMMARY CARCINOMA IN SITU. - SEE MICROSCOPIC DESCRIPTION. 2. Breast, right, needle core biopsy, UIQ - INVASIVE MAMMARY CARCINOMA. - MAMMARY CARCINOMA IN SITU. - SEE MICROSCOPIC DESCRIPTION.  Microscopic Comment 1. There is invasive mammary carcinoma which has features suggestive of invasive lobular carcinoma. There is also a separate component which may represent an invasive ductal component. E-Cadherin immunohistochemistry will be performed as well as prognostic profile. 2. E-Cadherin and breast prognostic profile will be performed.  ADDENDUM: 1,2. Immunohistochemistry for E-Cadherin shows an invasive and in situ component that is E-Cadherin negative consistent with lobular carcinoma. There is also a separate morphologic component which is strongly E-Cadherin positive consistent with invasive and in situ ductal carcinoma. Basal cell markers for p63, calponin and smooth muscle myosin are negative in the invasive component supporting the diagnosis. (JDP:kh 08-04-17)  1. PROGNOSTIC INDICATORS Results: IMMUNOHISTOCHEMICAL AND MORPHOMETRIC ANALYSIS PERFORMED MANUALLY Estrogen Receptor: 100%, POSITIVE, STRONG STAINING INTENSITY Progesterone Receptor: 10%, POSITIVE, STRONG STAINING INTENSITY Proliferation Marker Ki67: 20%  1. FLUORESCENCE IN-SITU HYBRIDIZATION  Results: HER2 - NEGATIVE RATIO OF HER2/CEP17 SIGNALS 1.41 AVERAGE HER2 COPY NUMBER PER CELL 1.90  2. PROGNOSTIC INDICATORS Results: IMMUNOHISTOCHEMICAL AND MORPHOMETRIC ANALYSIS PERFORMED MANUALLY Estrogen Receptor: 100%, POSITIVE, STRONG STAINING INTENSITY Progesterone Receptor: 20%, POSITIVE, STRONG STAINING INTENSITY Proliferation Marker Ki67: 20%  2. FLUORESCENCE IN-SITU HYBRIDIZATION Results: HER2 - NEGATIVE RATIO OF HER2/CEP17 SIGNALS 1.25 AVERAGE HER2 COPY NUMBER PER CELL 2.00   08/19/2017 Initial Diagnosis   Malignant neoplasm of upper-inner quadrant of breast in female, estrogen receptor positive (HCC)   08/19/2017  Cancer Staging   Staging form: Breast, AJCC 8th Edition - Clinical stage from 08/19/2017: cT1c, cN0, cM0, ER+, PR+, HER2- - Signed by Pollyann Samples, NP on 08/19/2017      INTERVAL HISTORY:   Jennifer Harvey is a 62 y.o. female presenting to clinic today for follow up of right breast cancer. She was last seen by me on 08/19/22.  Since her last visit, she underwent a colonoscopy on 09/21/22 with Dr. Marletta Lor. Surgical pathology of the cecum and ascending colon biopsy revealed: colonic mucosa without significant diagnostic alteration, with no evidence of lymphocytic colitis or collagenous colitis and no evidence of activity, chronicity, granuloma, dysplasia or malignancy. The transverse colon biopsy revealed the same as above. The polypectomy of the transverse and descending colon revealed tubular adenoma negative for high-grade dysplasia. The biopsy of the descending colon revealed: colonic mucosa without significant diagnostic alteration, with no evidence of lymphocytic colitis or collagenous colitis and no evidence of activity, chronicity, granuloma, dysplasia or malignancy. The biopsy of the sigmoid colon revealed: colonic mucosa with congestion and intramucosal hemorrhage and reactive epithelial changes, with no evidence of lymphocytic colitis or collagenous colitis and no evidence of activity, chronicity, granuloma, dysplasia or malignancy. The rectum biopsy revealed: colonic mucosa with intraepithelial histocytes, with no evidence of lymphocytic colitis or collagenous colitis and no evidence of activity, chronicity, granuloma, dysplasia or malignancy.  Of note, she presented to the ED on 02/08/23 for a viral URI with cough.  Today, she states that she is doing well overall. Her appetite level is at 100%. Her energy level is at 75%. She reports occasional hot flashes from tamoxifen. She started veozah last week and notes nausea. She is not taking any vitamin D. She would like a refill of amlodipine.    PAST  MEDICAL HISTORY:   Past Medical History: Past Medical History:  Diagnosis Date   Breast cancer (HCC)    Cancer (HCC)    renal cell carcinoma 2007 s/p partial right nephrectomy    History of kidney cancer    Hypertension    Personal history of chemotherapy    Personal history of radiation therapy    Sleep apnea     Surgical History: Past Surgical History:  Procedure Laterality Date   BIOPSY  09/21/2022   Procedure: BIOPSY;  Surgeon: Lanelle Bal, DO;  [facility];  Service: Endoscopy;;   BREAST LUMPECTOMY Right 2019   BREAST SURGERY     CESAREAN SECTION  1994   COLONOSCOPY     COLONOSCOPY WITH PROPOFOL N/A 09/21/2022   Procedure: COLONOSCOPY WITH PROPOFOL;  Surgeon: Lanelle Bal, DO;  [facility]: [facility];  Service: Endoscopy;  Laterality: N/A;  random colon biopsies, 145pm, asa 2   HYSTEROSCOPY WITH D & C N/A 03/12/2020   Procedure: Hysteroscopy Uterine Curettage;  Surgeon: Lazaro Arms, MD;  Location: [facility];  Service: Gynecology;  Laterality: N/A;   HYSTEROSCOPY WITH D & C N/A 11/11/2020   Procedure:  DILATATION AND CURETTAGE /HYSTEROSCOPY;  Surgeon: Myna Hidalgo, DO;  Location: [facility];  Service: Gynecology;  Laterality: N/A;   partial right nephrectomy  2007   POLYPECTOMY N/A 11/11/2020   Procedure: Myosure POLYPECTOMY;  Surgeon: Myna Hidalgo, DO;  Location: [facility];  Service: Gynecology;  Laterality: N/A;   POLYPECTOMY  09/21/2022   Procedure: POLYPECTOMY;  Surgeon: Lanelle Bal, DO;  [facility];  Service: Endoscopy;;    Social History: Social History   Socioeconomic History   Marital status: Married    Spouse name: Not on file   Number of children: 4   Years of education: 10   Highest education level: Not on file  Occupational History   Occupation: makes cookies    Employer: [employer]  Tobacco Use   Smoking status: Never   Smokeless tobacco: Never  Vaping Use   Vaping status: Never Used  Substance and Sexual Activity    Alcohol use: Yes    Alcohol/week: 14.0 standard drinks of alcohol    Types: 14 Cans of beer per week    Comment: 2 beers per day   Drug use: Yes    Types: Marijuana    Comment: last used 09/19/22   Sexual activity: Not Currently    Birth control/protection: Post-menopausal  Other Topics Concern   Not on file  Social History Narrative   Left handed   One floor home    Lives with husband and her mom   No caffeine   Not employed at this time   Social Drivers of Corporate investment banker Strain: High Risk (02/16/2022)   Overall Financial Resource Strain (CARDIA)    Difficulty of Paying Living Expenses: Hard  Food Insecurity: Food Insecurity Present (02/16/2022)   Hunger Vital Sign    Worried About Running Out of Food in the Last Year: Never true    Ran Out of Food in the Last Year: Sometimes true  Transportation Needs: No Transportation Needs (02/16/2022)   PRAPARE - Administrator, Civil Service (Medical): No    Lack of Transportation (Non-Medical): No  Physical Activity: Inactive (02/16/2022)   Exercise Vital Sign    Days of Exercise per Week: 0 days    Minutes of Exercise per Session: 0 min  Stress: No Stress Concern Present (02/16/2022)   [employer] of Occupational Health - Occupational Stress Questionnaire    Feeling of Stress : Only a little  Social Connections: Moderately Integrated (02/16/2022)   Social Connection and Isolation Panel [NHANES]    Frequency of Communication with Friends and Family: More than three times a week    Frequency of Social Gatherings with Friends and Family: Once a week    Attends Religious Services: 1 to 4 times per year    Active Member of [employer] or Organizations: No    Attends Banker Meetings: Never    Marital Status: Married  Catering manager Violence: Not At Risk (02/16/2022)   Humiliation, Afraid, Rape, and Kick questionnaire    Fear of Current or Ex-Partner: No    Emotionally Abused: No    Physically Abused:  No    Sexually Abused: No    Family History: Family History  Problem Relation Age of Onset   Other Father        auto accident   Cancer Maternal Grandmother        stomach   Cancer Maternal Grandfather        kidney cancer   Colon cancer  Neg Hx     Current Medications:  Current Outpatient Medications:    albuterol (PROVENTIL) (2.5 MG/3ML) 0.083% nebulizer solution, Take 3 mLs (2.5 mg total) by nebulization every 6 (six) hours as needed for wheezing or shortness of breath., Disp: 75 mL, Rfl: 12   albuterol (VENTOLIN HFA) 108 (90 Base) MCG/ACT inhaler, Inhale 1-2 puffs into the lungs every 6 (six) hours as needed for wheezing or shortness of breath., Disp: 1 each, Rfl: 0   amLODipine (NORVASC) 5 MG tablet, Take 1 tablet (5 mg total) by mouth daily., Disp: 90 tablet, Rfl: 1   benazepril (LOTENSIN) 10 MG tablet, Take 1 tablet (10 mg total) by mouth daily., Disp: 90 tablet, Rfl: 3   cyanocobalamin (VITAMIN B12) 1000 MCG tablet, Take 1,000 mcg by mouth daily., Disp: , Rfl:    dicyclomine (BENTYL) 10 MG capsule, Take 1 capsule (10 mg total) by mouth 3 (three) times daily before meals., Disp: 90 capsule, Rfl: 3   ergocalciferol (VITAMIN D2) 1.25 MG (50000 UT) capsule, Take 1 capsule (50,000 Units total) by mouth once a week., Disp: 4 capsule, Rfl: 5   Fezolinetant (VEOZAH) 45 MG TABS, Take 1 tablet (45 mg total) by mouth at bedtime., Disp: , Rfl:    gabapentin (NEURONTIN) 300 MG capsule, Take 1 capsule (300 mg total) by mouth 3 (three) times daily as needed., Disp: 90 capsule, Rfl: 3   methocarbamol (ROBAXIN) 500 MG tablet, Take 1 tablet (500 mg total) by mouth 2 (two) times daily as needed for muscle spasms., Disp: 20 tablet, Rfl: 0   naproxen (NAPROSYN) 500 MG tablet, Take 1 tablet (500 mg total) by mouth 2 (two) times daily with a meal., Disp: 30 tablet, Rfl: 0   omeprazole (PRILOSEC) 40 MG capsule, Take 40 mg by mouth every morning., Disp: , Rfl:    tamoxifen (NOLVADEX) 20 MG tablet, TAKE  1 TABLET(20 MG) BY MOUTH DAILY, Disp: 30 tablet, Rfl: 0   Thiamine HCl (VITAMIN B-1) 250 MG tablet, Take 250 mg by mouth daily., Disp: , Rfl:    TOPAMAX 25 MG tablet, Take 1 tablet (25 mg total) by mouth daily., Disp: 30 tablet, Rfl: 3   venlafaxine (EFFEXOR) 75 MG tablet, Take 1 tablet (75 mg total) by mouth daily., Disp: 90 tablet, Rfl: 1   Allergies: No Known Allergies  REVIEW OF SYSTEMS:   Review of Systems  Constitutional:  Negative for chills, fatigue and fever.  HENT:   Negative for lump/mass, mouth sores, nosebleeds, sore throat and trouble swallowing.   Eyes:  Negative for eye problems.  Respiratory:  Positive for shortness of breath. Negative for cough.   Cardiovascular:  Negative for chest pain, leg swelling and palpitations.  Gastrointestinal:  Positive for nausea. Negative for abdominal pain, constipation, diarrhea and vomiting.  Endocrine: Positive for hot flashes.  Genitourinary:  Negative for bladder incontinence, difficulty urinating, dysuria, frequency, hematuria and nocturia.        +urinary urgency  Musculoskeletal:  Negative for arthralgias, back pain, flank pain, myalgias and neck pain.  Skin:  Negative for itching and rash.  Neurological:  Positive for headaches. Negative for dizziness and numbness.  Hematological:  Does not bruise/bleed easily.  Psychiatric/Behavioral:  Positive for depression. Negative for sleep disturbance and suicidal ideas. The patient is nervous/anxious.   All other systems reviewed and are negative.    VITALS:   Blood pressure (!) 148/97, pulse 80, temperature 97.7 F (36.5 C), temperature source Tympanic, resp. rate 20, weight 197 lb 5 oz (  89.5 kg), last menstrual period 04/29/2020, SpO2 97%.  Wt Readings from Last 3 Encounters:  02/24/23 197 lb 5 oz (89.5 kg)  02/17/23 201 lb (91.2 kg)  02/08/23 195 lb (88.5 kg)    Body mass index is 31.85 kg/m.  Performance status (ECOG): 1 - Symptomatic but completely ambulatory  PHYSICAL  EXAM:   Physical Exam Vitals and nursing note reviewed. Exam conducted with a chaperone present.  Constitutional:      Appearance: Normal appearance.  Cardiovascular:     Rate and Rhythm: Normal rate and regular rhythm.     Pulses: Normal pulses.     Heart sounds: Normal heart sounds.  Pulmonary:     Effort: Pulmonary effort is normal.     Breath sounds: Normal breath sounds.  Abdominal:     Palpations: Abdomen is soft. There is no hepatomegaly, splenomegaly or mass.     Tenderness: There is no abdominal tenderness.  Musculoskeletal:     Right lower leg: No edema.     Left lower leg: No edema.  Lymphadenopathy:     Cervical: No cervical adenopathy.     Right cervical: No superficial, deep or posterior cervical adenopathy.    Left cervical: No superficial, deep or posterior cervical adenopathy.     Upper Body:     Right upper body: No supraclavicular or axillary adenopathy.     Left upper body: No supraclavicular or axillary adenopathy.  Neurological:     General: No focal deficit present.     Mental Status: She is alert and oriented to person, place, and time.  Psychiatric:        Mood and Affect: Mood normal.        Behavior: Behavior normal.     LABS:      Latest Ref Rng & Units 02/17/2023   10:52 AM 08/11/2022    9:28 AM 06/22/2022   11:25 AM  CBC  WBC 4.0 - 10.5 K/uL 3.7  4.1  4.3   Hemoglobin 12.0 - 15.0 g/dL 16.1  09.6  04.5   Hematocrit 36.0 - 46.0 % 36.9  40.3  37.5   Platelets 150 - 400 K/uL 249  239  219       Latest Ref Rng & Units 02/17/2023   10:52 AM 08/11/2022    9:28 AM 06/22/2022   11:25 AM  CMP  Glucose 70 - 99 mg/dL 409  811  914   BUN 6 - 20 mg/dL 10  8  10    Creatinine 0.44 - 1.00 mg/dL 7.82  9.56  2.13   Sodium 135 - 145 mmol/L 142  143  136   Potassium 3.5 - 5.1 mmol/L 3.3  4.1  3.7   Chloride 98 - 111 mmol/L 105  103  107   CO2 22 - 32 mmol/L 29  25  24    Calcium 8.9 - 10.3 mg/dL 9.1  9.3  8.8   Total Protein 6.5 - 8.1 g/dL 6.8  6.8  6.8    Total Bilirubin <1.2 mg/dL 0.3  <0.8  0.5   Alkaline Phos 38 - 126 U/L 79  117  93   AST 15 - 41 U/L 22  25  23    ALT 0 - 44 U/L 21  28  28       No results found for: "CEA1", "CEA" / No results found for: "CEA1", "CEA" No results found for: "PSA1" No results found for: "MVH846" No results found for: "NGE952"  No results found for: "  TOTALPROTELP", "ALBUMINELP", "A1GS", "A2GS", "BETS", "BETA2SER", "GAMS", "MSPIKE", "SPEI" No results found for: "TIBC", "FERRITIN", "IRONPCTSAT" No results found for: "LDH"   STUDIES:   DG Chest 2 View Result Date: 02/08/2023 CLINICAL DATA:  cough EXAM: CHEST - 2 VIEW COMPARISON:  CXR 05/06/21 FINDINGS: No pleural effusion. No pneumothorax. No focal airspace opacity. Normal cardiac and mediastinal contours. No radiographically apparent displaced rib fractures. Visualized upper abdomen is unremarkable. Surgical clips in the right axilla. Vertebral body heights are maintained. IMPRESSION: No focal airspace opacity. Electronically Signed   By: Lorenza Cambridge M.D.   On: 02/08/2023 08:54

## 2023-02-24 ENCOUNTER — Other Ambulatory Visit (HOSPITAL_COMMUNITY): Payer: Self-pay | Admitting: Hematology

## 2023-02-24 ENCOUNTER — Inpatient Hospital Stay: Payer: BC Managed Care – PPO | Admitting: Hematology

## 2023-02-24 ENCOUNTER — Ambulatory Visit (HOSPITAL_COMMUNITY)
Admission: RE | Admit: 2023-02-24 | Discharge: 2023-02-24 | Disposition: A | Discharge status: Home / Self Care | Payer: BC Managed Care – PPO | Source: Ambulatory Visit | Attending: Hematology | Admitting: Hematology

## 2023-02-24 VITALS — BP 148/97 | HR 80 | Temp 97.7°F | Resp 20 | Wt 197.3 lb

## 2023-02-24 DIAGNOSIS — Z923 Personal history of irradiation: Secondary | ICD-10-CM | POA: Diagnosis not present

## 2023-02-24 DIAGNOSIS — Z170 Estrogen receptor positive status [ER+]: Secondary | ICD-10-CM | POA: Insufficient documentation

## 2023-02-24 DIAGNOSIS — Z1231 Encounter for screening mammogram for malignant neoplasm of breast: Secondary | ICD-10-CM | POA: Insufficient documentation

## 2023-02-24 DIAGNOSIS — C50211 Malignant neoplasm of upper-inner quadrant of right female breast: Secondary | ICD-10-CM | POA: Diagnosis not present

## 2023-02-24 DIAGNOSIS — E559 Vitamin D deficiency, unspecified: Secondary | ICD-10-CM | POA: Diagnosis not present

## 2023-02-24 DIAGNOSIS — Z9221 Personal history of antineoplastic chemotherapy: Secondary | ICD-10-CM | POA: Diagnosis not present

## 2023-02-24 DIAGNOSIS — Z79810 Long term (current) use of selective estrogen receptor modulators (SERMs): Secondary | ICD-10-CM | POA: Diagnosis not present

## 2023-02-24 DIAGNOSIS — Z801 Family history of malignant neoplasm of trachea, bronchus and lung: Secondary | ICD-10-CM | POA: Diagnosis not present

## 2023-02-24 DIAGNOSIS — Z85528 Personal history of other malignant neoplasm of kidney: Secondary | ICD-10-CM | POA: Diagnosis not present

## 2023-02-24 DIAGNOSIS — N951 Menopausal and female climacteric states: Secondary | ICD-10-CM | POA: Diagnosis not present

## 2023-02-24 MED ORDER — ERGOCALCIFEROL 1.25 MG (50000 UT) PO CAPS: 50000.0000 [IU] | ORAL_CAPSULE | ORAL | 5 refills | Status: DC

## 2023-02-24 NOTE — Patient Instructions (Signed)
[facility] at [facility] Discharge Instructions   You were seen and examined today by Dr. Ellin Saba.  He reviewed the results of your lab work which is mostly normal/stable. Your vitamin D is very low at 9. We will send a prescription to your pharmacy for high dose vitamin D. You will take 50,000 units once a week. Dr. Kirtland Bouchard prescribed this for 6 months. After you complete 6 months of this weekly vitamin D, you should take over the counter vitamin D 2000 units daily.    You will complete your 5 years of Tamoxifen in December.   We will see you back in    Thank you for choosing [facility] at [facility] to provide your oncology and hematology care.  To afford each patient quality time with our provider, please arrive at least 15 minutes before your scheduled appointment time.   If you have a lab appointment with the Cancer Center please come in thru the Main Entrance and check in at the main information desk.  You need to re-schedule your appointment should you arrive 10 or more minutes late.  We strive to give you quality time with our providers, and arriving late affects you and other patients whose appointments are after yours.  Also, if you no show three or more times for appointments you may be dismissed from the clinic at the providers discretion.     Again, thank you for choosing [facility].  Our hope is that these requests will decrease the amount of time that you wait before being seen by our physicians.       _____________________________________________________________  Should you have questions after your visit to [facility], please contact our office at [phone] and follow the prompts.  Our office hours are 8:00 a.m. and 4:30 p.m. Monday - Friday.  Please note that voicemails left after 4:00 p.m. may not be returned until the following business day.  We are closed weekends and major holidays.  You do have  access to a nurse 24-7, just call the main number to the clinic [phone] and do not press any options, hold on the line and a nurse will answer the phone.    For prescription refill requests, have your pharmacy contact our office and allow 72 hours.    Due to Covid, you will need to wear a mask upon entering the hospital. If you do not have a mask, a mask will be given to you at the Main Entrance upon arrival. For doctor visits, patients may have 1 support person age 19 or older with them. For treatment visits, patients can not have anyone with them due to social distancing guidelines and our immunocompromised population.

## 2023-02-28 NOTE — Patient Instructions (Signed)

## 2023-02-28 NOTE — Progress Notes (Signed)
Established Patient Office Visit   Subjective  Patient ID: Jennifer Harvey, female    DOB: 10-31-1960  Age: 62 y.o. MRN: [id number]  Chief Complaint  Patient presents with   Hypertension    Out of HTN medication amlodipine x 2 mo.  Trouble catching breath, worse at night time causing insomnia.     She  has a past medical history of Breast cancer (HCC), Cancer (HCC), History of kidney cancer, Hypertension, Personal history of chemotherapy, Personal history of radiation therapy, and Sleep apnea.  HPI Patient presents to the clinic for chronic follow up. For the details of today's visit, please refer to assessment and plan.     Review of Systems  Constitutional:  Negative for chills and fever.  Eyes:  Negative for blurred vision.  Respiratory:  Positive for cough and shortness of breath.   Cardiovascular:  Negative for chest pain.  Genitourinary:  Negative for dysuria.  Neurological:  Negative for dizziness and headaches.      Objective:     BP 139/80   Pulse 75   Ht 5\' 6"  (1.676 m)   Wt 196 lb (88.9 kg)   LMP 04/29/2020 (Within Weeks) Comment: Bleeding  SpO2 93%   BMI 31.64 kg/m  BP Readings from Last 3 Encounters:  03/01/23 139/80  02/24/23 (!) 148/97  02/17/23 (!) 142/83      Physical Exam Vitals reviewed.  Constitutional:      General: She is not in acute distress.    Appearance: Normal appearance. She is not ill-appearing, toxic-appearing or diaphoretic.  HENT:     Head: Normocephalic.  Eyes:     General:        Right eye: No discharge.        Left eye: No discharge.     Conjunctiva/sclera: Conjunctivae normal.  Cardiovascular:     Rate and Rhythm: Normal rate.     Pulses: Normal pulses.     Heart sounds: Normal heart sounds.  Pulmonary:     Effort: Pulmonary effort is normal. No respiratory distress.     Breath sounds: Wheezing and rhonchi present.  Abdominal:     General: Bowel sounds are normal.     Palpations: Abdomen is soft.      Tenderness: There is no abdominal tenderness. There is no right CVA tenderness, left CVA tenderness or guarding.  Musculoskeletal:        General: Normal range of motion.     Cervical back: Normal range of motion.  Skin:    General: Skin is warm and dry.     Capillary Refill: Capillary refill takes less than 2 seconds.  Neurological:     Mental Status: She is alert.     Coordination: Coordination normal.     Gait: Gait normal.  Psychiatric:        Mood and Affect: Mood normal.        Behavior: Behavior normal.      No results found for any visits on 03/01/23.  The 10-year ASCVD risk score (Arnett DK, et al., 2019) is: 20.1%    Assessment & Plan:  Primary hypertension Assessment & Plan: Continue with amlodipine 5 mg once daily and benazepril 10 mg daily  Labs ordered. Discussed with  patient to monitor their blood pressure regularly and maintain a heart-healthy diet rich in fruits, vegetables, whole grains, and low-fat dairy, while reducing sodium intake to less than 2,300 mg per day. Regular physical activity, such as 30 minutes of moderate exercise most  days of the week, will help lower blood pressure and improve overall cardiovascular health. Avoiding smoking, limiting alcohol consumption, and managing stress. Take  prescribed medication, & take it as directed and avoid skipping doses. Seek emergency care if your blood pressure is (over 180/100) or you experience chest pain, shortness of breath, or sudden vision changes.Patient verbalizes understanding regarding plan of care and all questions answered.   Orders: -     BMP8+eGFR -     CBC with Differential/Platelet -     Lipid panel  Prediabetes -     Hemoglobin A1c  Encounter for immunization -     Flu vaccine trivalent PF, 6mos and older(Flulaval,Afluria,Fluarix,Fluzone)  Mild intermittent asthma without complication Assessment & Plan: Montelukast 10 mg at bedtime, Albuterol PRN, Advair inhaler twice daily Discussed to  Avoid Allergens and Irritants: Minimize exposure to dust, smoke, pet dander, and strong odors. Elevate the Head of Bed: Use pillows or bed risers to reduce postnasal drainage during sleep. Avoid Cold or Dry Air: Protect the airway with scarves or masks if exposed to extreme conditions.   Other orders -     amLODIPine Besylate; Take 1 tablet (5 mg total) by mouth daily.  Dispense: 90 tablet; Refill: 1 -     Montelukast Sodium; Take 1 tablet (10 mg total) by mouth at bedtime.  Dispense: 30 tablet; Refill: 3 -     Fluticasone-Salmeterol; Inhale 1 puff into the lungs 2 (two) times daily.  Dispense: 1 each; Refill: 3 -     Sertraline HCl; Take 1 tablet (25 mg total) by mouth daily.  Dispense: 30 tablet; Refill: 3    Return in about 4 months (around 06/30/2023), or if symptoms worsen or fail to improve, for chronic follow-up.   Cruzita Lederer Newman Nip, FNP

## 2023-03-01 ENCOUNTER — Ambulatory Visit (INDEPENDENT_AMBULATORY_CARE_PROVIDER_SITE_OTHER): Payer: BC Managed Care – PPO | Admitting: Family Medicine

## 2023-03-01 ENCOUNTER — Encounter: Payer: Self-pay | Admitting: Family Medicine

## 2023-03-01 VITALS — BP 139/80 | HR 75 | Ht 66.0 in | Wt 196.0 lb

## 2023-03-01 DIAGNOSIS — J4520 Mild intermittent asthma, uncomplicated: Secondary | ICD-10-CM | POA: Diagnosis not present

## 2023-03-01 DIAGNOSIS — R058 Other specified cough: Secondary | ICD-10-CM | POA: Insufficient documentation

## 2023-03-01 DIAGNOSIS — J45909 Unspecified asthma, uncomplicated: Secondary | ICD-10-CM | POA: Insufficient documentation

## 2023-03-01 DIAGNOSIS — Z23 Encounter for immunization: Secondary | ICD-10-CM | POA: Diagnosis not present

## 2023-03-01 DIAGNOSIS — I10 Essential (primary) hypertension: Secondary | ICD-10-CM

## 2023-03-01 DIAGNOSIS — R7303 Prediabetes: Secondary | ICD-10-CM

## 2023-03-01 MED ORDER — SERTRALINE HCL 25 MG PO TABS: 25.0000 mg | ORAL_TABLET | Freq: Every day | ORAL | 3 refills | Status: DC

## 2023-03-01 MED ORDER — MONTELUKAST SODIUM 10 MG PO TABS: 10.0000 mg | ORAL_TABLET | Freq: Every day | ORAL | 3 refills | Status: AC

## 2023-03-01 MED ORDER — AMLODIPINE BESYLATE 5 MG PO TABS: 5.0000 mg | ORAL_TABLET | Freq: Every day | ORAL | 1 refills | Status: DC

## 2023-03-01 MED ORDER — FLUTICASONE-SALMETEROL 100-50 MCG/ACT IN AEPB: 1.0000 | INHALATION_SPRAY | Freq: Two times a day (BID) | RESPIRATORY_TRACT | 3 refills | Status: AC

## 2023-03-01 NOTE — Assessment & Plan Note (Signed)
Montelukast 10 mg at bedtime, Albuterol PRN, Advair inhaler twice daily Discussed to Avoid Allergens and Irritants: Minimize exposure to dust, smoke, pet dander, and strong odors. Elevate the Head of Bed: Use pillows or bed risers to reduce postnasal drainage during sleep. Avoid Cold or Dry Air: Protect the airway with scarves or masks if exposed to extreme conditions.

## 2023-03-01 NOTE — Assessment & Plan Note (Addendum)
Montelukast 10 mg at bedtime Avoid Allergens and Irritants: Minimize exposure to dust, smoke, pet dander, and strong odors. Elevate the Head of Bed: Use pillows or bed risers to reduce postnasal drainage during sleep. Avoid Cold or Dry Air: Protect the airway with scarves or masks if exposed to extreme conditions.

## 2023-03-01 NOTE — Assessment & Plan Note (Signed)
Continue with amlodipine 5 mg once daily and benazepril 10 mg daily  Labs ordered. Discussed with  patient to monitor their blood pressure regularly and maintain a heart-healthy diet rich in fruits, vegetables, whole grains, and low-fat dairy, while reducing sodium intake to less than 2,300 mg per day. Regular physical activity, such as 30 minutes of moderate exercise most days of the week, will help lower blood pressure and improve overall cardiovascular health. Avoiding smoking, limiting alcohol consumption, and managing stress. Take  prescribed medication, & take it as directed and avoid skipping doses. Seek emergency care if your blood pressure is (over 180/100) or you experience chest pain, shortness of breath, or sudden vision changes.Patient verbalizes understanding regarding plan of care and all questions answered.

## 2023-03-02 ENCOUNTER — Other Ambulatory Visit: Payer: Self-pay | Admitting: Family Medicine

## 2023-03-02 LAB — CBC WITH DIFFERENTIAL/PLATELET
Basophils Absolute: 0 10*3/uL (ref 0.0–0.2)
Basos: 0 %
EOS (ABSOLUTE): 0.2 10*3/uL (ref 0.0–0.4)
Eos: 3 %
Hematocrit: 42.6 % (ref 34.0–46.6)
Hemoglobin: 14 g/dL (ref 11.1–15.9)
Immature Grans (Abs): 0 10*3/uL (ref 0.0–0.1)
Immature Granulocytes: 0 %
Lymphocytes Absolute: 2.2 10*3/uL (ref 0.7–3.1)
Lymphs: 41 %
MCH: 29.3 pg (ref 26.6–33.0)
MCHC: 32.9 g/dL (ref 31.5–35.7)
MCV: 89 fL (ref 79–97)
Monocytes Absolute: 0.5 10*3/uL (ref 0.1–0.9)
Monocytes: 9 %
Neutrophils Absolute: 2.6 10*3/uL (ref 1.4–7.0)
Neutrophils: 47 %
Platelets: 302 10*3/uL (ref 150–450)
RBC: 4.78 x10E6/uL (ref 3.77–5.28)
RDW: 12.3 % (ref 11.7–15.4)
WBC: 5.5 10*3/uL (ref 3.4–10.8)

## 2023-03-02 LAB — BMP8+EGFR
BUN/Creatinine Ratio: 21 (ref 12–28)
BUN: 25 mg/dL (ref 8–27)
CO2: 24 mmol/L (ref 20–29)
Calcium: 10.4 mg/dL — ABNORMAL HIGH (ref 8.7–10.3)
Chloride: 104 mmol/L (ref 96–106)
Creatinine, Ser: 1.18 mg/dL — ABNORMAL HIGH (ref 0.57–1.00)
Glucose: 114 mg/dL — ABNORMAL HIGH (ref 70–99)
Potassium: 4.6 mmol/L (ref 3.5–5.2)
Sodium: 144 mmol/L (ref 134–144)
eGFR: 53 mL/min/{1.73_m2} — ABNORMAL LOW (ref >59)

## 2023-03-02 LAB — LIPID PANEL
Chol/HDL Ratio: 2.8 {ratio} (ref 0.0–4.4)
Cholesterol, Total: 156 mg/dL (ref 100–199)
HDL: 56 mg/dL (ref >39)
LDL Chol Calc (NIH): 78 mg/dL (ref 0–99)
Triglycerides: 122 mg/dL (ref 0–149)
VLDL Cholesterol Cal: 22 mg/dL (ref 5–40)

## 2023-03-02 LAB — HEMOGLOBIN A1C
Est. average glucose Bld gHb Est-mCnc: 229 mg/dL
Hgb A1c MFr Bld: 9.6 % — ABNORMAL HIGH (ref 4.8–5.6)

## 2023-03-02 MED ORDER — METFORMIN HCL 500 MG PO TABS: 500.0000 mg | ORAL_TABLET | Freq: Two times a day (BID) | ORAL | 3 refills | Status: AC

## 2023-03-02 NOTE — Progress Notes (Signed)
Please inform patient, Hemoglobin A1c 9.6 resulting in type 2 diabetes  Type 2 diabetes is a chronic condition where the body becomes resistant to insulin or doesn't produce enough insulin, leading to elevated blood sugar levels. It often develops due to a combination of genetic and lifestyle factors, such as obesity and inactivity.  Normally, your body uses a hormone called insulin to help sugar move from your blood into your cells. But when you have Type 2 diabetes, your body isn't able to use insulin properly, or it doesn't make enough of it. This causes sugar to build up in your blood instead of being used by your cells for energy.  Plan: Metformin 500 mg twice daily.  If you experience GI effects such as: Nausea, Diarrhea, Abdominal discomfort or cramping Flatulence (gas). Please let me know via my chart or call.    A Sample Day of Eating for Diabetes   Breakfast: Oatmeal with a handful of berries and a sprinkle of chia seeds, paired with a boiled egg.  Lunch: Grilled chicken breast on a bed of spinach and kale, topped with avocado, a drizzle of olive oil, and a slice of whole grain bread.  Snack: A handful of almonds or a small apple with peanut butter.  Dinner: Baked salmon with roasted broccoli and quinoa.  Snack (if needed): A piece of cheese or a small bowl of Greek yogurt.  Find an activity that you will enjoyandstart to be active at least 5 days a week for 30 minutes each day.     Your eGFR levels indicates decreased kidney function, I advise to keep your hypertension controlled, maintain blood pressure reading goals under 130/80, take your daily blood pressure medications. Managing your diabetes with a hemoglobin A1c goal less than 7. Keep your cholesterol under control to prevent further damage to blood vessels. Avoid NSAIDs medications and take tylenol for pain management.Consume a kidney friendly diet which includes Veggies: cauliflower, onions, eggplant, turnips. Low  sodium, low to moderate intake of proteins: lean meats (poultry, fish), eggs, unsalted seafood. Avoid fatty foods, limit or avoid smoking and alcohol intake. Maintain an excercise routine to minimum of 150 minuties a week. Follow up in 4 months to recheck labs.

## 2023-03-03 ENCOUNTER — Emergency Department (HOSPITAL_COMMUNITY)
Admission: EM | Admit: 2023-03-03 | Discharge: 2023-03-03 | Disposition: A | Discharge status: Home / Self Care | Payer: BC Managed Care – PPO | Attending: Emergency Medicine | Admitting: Emergency Medicine

## 2023-03-03 ENCOUNTER — Encounter (HOSPITAL_COMMUNITY): Payer: Self-pay

## 2023-03-03 ENCOUNTER — Emergency Department (HOSPITAL_COMMUNITY): Payer: BC Managed Care – PPO

## 2023-03-03 ENCOUNTER — Other Ambulatory Visit: Payer: Self-pay

## 2023-03-03 DIAGNOSIS — I10 Essential (primary) hypertension: Secondary | ICD-10-CM | POA: Insufficient documentation

## 2023-03-03 DIAGNOSIS — E119 Type 2 diabetes mellitus without complications: Secondary | ICD-10-CM | POA: Diagnosis not present

## 2023-03-03 DIAGNOSIS — Z79899 Other long term (current) drug therapy: Secondary | ICD-10-CM | POA: Insufficient documentation

## 2023-03-03 DIAGNOSIS — Z853 Personal history of malignant neoplasm of breast: Secondary | ICD-10-CM | POA: Diagnosis not present

## 2023-03-03 DIAGNOSIS — R519 Headache, unspecified: Secondary | ICD-10-CM | POA: Insufficient documentation

## 2023-03-03 DIAGNOSIS — Z7984 Long term (current) use of oral hypoglycemic drugs: Secondary | ICD-10-CM | POA: Insufficient documentation

## 2023-03-03 LAB — CBC WITH DIFFERENTIAL/PLATELET
Abs Immature Granulocytes: 0.01 10*3/uL (ref 0.00–0.07)
Basophils Absolute: 0.1 10*3/uL (ref 0.0–0.1)
Basophils Relative: 2 %
Eosinophils Absolute: 0.1 10*3/uL (ref 0.0–0.5)
Eosinophils Relative: 4 %
HCT: 38.8 % (ref 36.0–46.0)
Hemoglobin: 12.7 g/dL (ref 12.0–15.0)
Immature Granulocytes: 0 %
Lymphocytes Relative: 54 %
Lymphs Abs: 1.8 10*3/uL (ref 0.7–4.0)
MCH: 29 pg (ref 26.0–34.0)
MCHC: 32.7 g/dL (ref 30.0–36.0)
MCV: 88.6 fL (ref 80.0–100.0)
Monocytes Absolute: 0.4 10*3/uL (ref 0.1–1.0)
Monocytes Relative: 10 %
Neutro Abs: 1 10*3/uL — ABNORMAL LOW (ref 1.7–7.7)
Neutrophils Relative %: 30 %
Platelets: 240 10*3/uL (ref 150–400)
RBC: 4.38 MIL/uL (ref 3.87–5.11)
RDW: 13.2 % (ref 11.5–15.5)
WBC: 3.4 10*3/uL — ABNORMAL LOW (ref 4.0–10.5)
nRBC: 0 % (ref 0.0–0.2)

## 2023-03-03 LAB — COMPREHENSIVE METABOLIC PANEL
ALT: 24 U/L (ref 0–44)
AST: 27 U/L (ref 15–41)
Albumin: 3.8 g/dL (ref 3.5–5.0)
Alkaline Phosphatase: 73 U/L (ref 38–126)
Anion gap: 7 (ref 5–15)
BUN: 7 mg/dL (ref 6–20)
CO2: 25 mmol/L (ref 22–32)
Calcium: 8.6 mg/dL — ABNORMAL LOW (ref 8.9–10.3)
Chloride: 105 mmol/L (ref 98–111)
Creatinine, Ser: 0.57 mg/dL (ref 0.44–1.00)
GFR, Estimated: >60 mL/min (ref >60)
Glucose, Bld: 103 mg/dL — ABNORMAL HIGH (ref 70–99)
Potassium: 3.3 mmol/L — ABNORMAL LOW (ref 3.5–5.1)
Sodium: 137 mmol/L (ref 135–145)
Total Bilirubin: 0.4 mg/dL (ref <1.2)
Total Protein: 6.8 g/dL (ref 6.5–8.1)

## 2023-03-03 MED ORDER — IOHEXOL 350 MG/ML SOLN
75.0000 mL | Freq: Once | INTRAVENOUS | Status: AC | PRN
  Administered 2023-03-03: 75 mL via INTRAVENOUS

## 2023-03-03 MED ORDER — OXYCODONE-ACETAMINOPHEN 5-325 MG PO TABS
1.0000 | ORAL_TABLET | Freq: Once | ORAL | Status: DC
  Filled 2023-03-03: qty 1

## 2023-03-03 MED ORDER — PROCHLORPERAZINE EDISYLATE 10 MG/2ML IJ SOLN
10.0000 mg | Freq: Once | INTRAMUSCULAR | Status: AC
Start: 2023-03-03 — End: 2023-03-03
  Administered 2023-03-03: 10 mg via INTRAVENOUS
  Filled 2023-03-03: qty 2

## 2023-03-03 MED ORDER — DIPHENHYDRAMINE HCL 50 MG/ML IJ SOLN
25.0000 mg | Freq: Once | INTRAMUSCULAR | Status: AC
Start: 2023-03-03 — End: 2023-03-03
  Administered 2023-03-03: 25 mg via INTRAVENOUS
  Filled 2023-03-03: qty 1

## 2023-03-03 MED ORDER — LACTATED RINGERS IV BOLUS
1000.0000 mL | Freq: Once | INTRAVENOUS | Status: AC
  Administered 2023-03-03: 1000 mL via INTRAVENOUS

## 2023-03-03 MED ORDER — OXYCODONE-ACETAMINOPHEN 5-325 MG PO TABS: ORAL_TABLET | ORAL | 0 refills | Status: DC

## 2023-03-03 NOTE — ED Notes (Signed)
Patient transported to CT 

## 2023-03-03 NOTE — ED Triage Notes (Signed)
Constant headache for a week.

## 2023-03-03 NOTE — Discharge Instructions (Signed)
Follow up with your md next week if any problems °

## 2023-03-03 NOTE — ED Provider Notes (Signed)
EMERGENCY DEPARTMENT AT [facility] Provider Note   CSN: [id number] Arrival date & time: 03/03/23  0545     History  Chief Complaint  Patient presents with   Headache    Jennifer Harvey is a 62 y.o. female.  62 year old female who presents ER today with a headache.  Patient states she has history of headaches about once every few months maybe once a year but this has been going on for a week.  She has elevated blood pressure with a 2.  No other neurologic symptoms.  She is concerned that no something else going on.  She has a history of breast cancer.  No trauma.  No vision changes.  No other associated symptoms.  No known inciting factors.  No increased stress in her life.  She did recently start working out secondary to diabetes diagnosis.  She stopped her tamoxifen recently after being cleared from a breast surgery standpoint.  She started metformin recently however her headache was present prior to that.   Headache      Home Medications Prior to Admission medications   Medication Sig Start Date End Date Taking? Authorizing Provider  albuterol (PROVENTIL) (2.5 MG/3ML) 0.083% nebulizer solution Take 3 mLs (2.5 mg total) by nebulization every 6 (six) hours as needed for wheezing or shortness of breath. 02/08/23   Sloan Leiter, DO  albuterol (VENTOLIN HFA) 108 (90 Base) MCG/ACT inhaler Inhale 1-2 puffs into the lungs every 6 (six) hours as needed for wheezing or shortness of breath. 02/08/23   Sloan Leiter, DO  amLODipine (NORVASC) 5 MG tablet Take 1 tablet (5 mg total) by mouth daily. 03/01/23   Del Nigel Berthold, FNP  benazepril (LOTENSIN) 10 MG tablet Take 1 tablet (10 mg total) by mouth daily. 08/11/22   Del Nigel Berthold, FNP  cyanocobalamin (VITAMIN B12) 1000 MCG tablet Take 1,000 mcg by mouth daily.    [provider]  ergocalciferol (VITAMIN D2) 1.25 MG (50000 UT) capsule Take 1 capsule (50,000 Units total) by mouth once a  week. 02/24/23   Doreatha Massed, MD  Fezolinetant (VEOZAH) 45 MG TABS Take 1 tablet (45 mg total) by mouth at bedtime. 02/17/23   Lazaro Arms, MD  fluticasone-salmeterol (ADVAIR) 100-50 MCG/ACT AEPB Inhale 1 puff into the lungs 2 (two) times daily. 03/01/23   Del Nigel Berthold, FNP  gabapentin (NEURONTIN) 300 MG capsule Take 1 capsule (300 mg total) by mouth 3 (three) times daily as needed. 08/11/22   Del Nigel Berthold, FNP  metFORMIN (GLUCOPHAGE) 500 MG tablet Take 1 tablet (500 mg total) by mouth 2 (two) times daily with a meal. 03/02/23   Del Newman Nip, Tenna Child, FNP  methocarbamol (ROBAXIN) 500 MG tablet Take 1 tablet (500 mg total) by mouth 2 (two) times daily as needed for muscle spasms. 09/19/22   Eber Hong, MD  montelukast (SINGULAIR) 10 MG tablet Take 1 tablet (10 mg total) by mouth at bedtime. 03/01/23   Del Nigel Berthold, FNP  omeprazole (PRILOSEC) 40 MG capsule Take 40 mg by mouth every morning.    [provider]  sertraline (ZOLOFT) 25 MG tablet Take 1 tablet (25 mg total) by mouth daily. 03/01/23   Del Nigel Berthold, FNP  Thiamine HCl (VITAMIN B-1) 250 MG tablet Take 250 mg by mouth daily.    [provider]  venlafaxine (EFFEXOR) 75 MG tablet Take 1 tablet (75 mg total) by mouth daily. 08/11/22  Del Nigel Berthold, FNP      Allergies    Patient has no known allergies.    Review of Systems   Review of Systems  Neurological:  Positive for headaches.    Physical Exam Updated Vital Signs BP (!) 150/98   Pulse 72   Temp 98 F (36.7 C)   Resp 17   Ht 5\' 6"  (1.676 m)   Wt 89 kg   LMP 04/29/2020 (Within Weeks) Comment: Bleeding  SpO2 97%   BMI 31.67 kg/m  Physical Exam Vitals and nursing note reviewed.  Constitutional:      Appearance: She is well-developed.  HENT:     Head: Normocephalic and atraumatic.  Eyes:     Extraocular Movements: Extraocular movements intact.     Pupils: Pupils are equal, round,  and reactive to light.  Cardiovascular:     Rate and Rhythm: Normal rate and regular rhythm.  Pulmonary:     Effort: No respiratory distress.     Breath sounds: No stridor.  Abdominal:     General: There is no distension.  Musculoskeletal:     Cervical back: Normal range of motion.  Neurological:     Mental Status: She is alert.     Comments: No altered mental status, able to give full seemingly accurate history.  Face is symmetric, EOM's intact, pupils equal and reactive, vision intact, tongue and uvula midline without deviation. Upper and Lower extremity motor 5/5, intact pain perception in distal extremities, 2+ reflexes in biceps, patella and achilles tendons. Able to perform finger to nose normal with both hands. Walks without assistance or evident ataxia.    Psychiatric:        Mood and Affect: Mood normal.     ED Results / Procedures / Treatments   Labs (all labs ordered are listed, but only abnormal results are displayed) Labs Reviewed  CBC WITH DIFFERENTIAL/PLATELET - Abnormal; Notable for the following components:      Result Value   WBC 3.4 (*)    Neutro Abs 1.0 (*)    All other components within normal limits  COMPREHENSIVE METABOLIC PANEL - Abnormal; Notable for the following components:   Potassium 3.3 (*)    Glucose, Bld 103 (*)    Calcium 8.6 (*)    All other components within normal limits    EKG None  Radiology CT ANGIO HEAD NECK W WO CM Result Date: 03/03/2023 CLINICAL DATA:  Constant headache for a week. EXAM: CT ANGIOGRAPHY HEAD AND NECK WITH AND WITHOUT CONTRAST TECHNIQUE: Multidetector CT imaging of the head and neck was performed using the standard protocol during bolus administration of intravenous contrast. Multiplanar CT image reconstructions and MIPs were obtained to evaluate the vascular anatomy. Carotid stenosis measurements (when applicable) are obtained utilizing NASCET criteria, using the distal internal carotid diameter as the denominator.  RADIATION DOSE REDUCTION: This exam was performed according to the departmental dose-optimization program which includes automated exposure control, adjustment of the mA and/or kV according to patient size and/or use of iterative reconstruction technique. CONTRAST:  75mL OMNIPAQUE IOHEXOL 350 MG/ML SOLN COMPARISON:  Brain MRI 09/13/2022 FINDINGS: CT HEAD FINDINGS Brain: No evidence of acute infarction, hemorrhage, hydrocephalus, extra-axial collection or mass lesion/mass effect. Dural ossification at the vertex, nonspecific. Vascular: See below Skull: Normal. Negative for fracture or focal lesion. Sinuses/Orbits: No acute finding Review of the MIP images confirms the above findings CTA NECK FINDINGS Aortic arch: Atheromatous calcification with 2 vessel branching. Right carotid system: Vessels are smoothly  contoured and widely patent. No significant atheromatous change. Left carotid system: Vessels are smoothly contoured and widely patent. No significant atheromatous change. Vertebral arteries: No subclavian or vertebral stenosis or ulceration. Strong right vertebral artery dominance with diminutive left vertebral artery and transverse foramen. Beading of the right vertebral especially at the distal V2 to V3 segments. No underlying dissection or pseudoaneurysm. Skeleton: No acute finding. Extensive stylohyoid ligament ossification on the right, nearly reaching the hyoid. Ordinary cervical spine degeneration without acute finding. Other neck: No acute or aggressive finding. Upper chest: Clear apical lungs Review of the MIP images confirms the above findings CTA HEAD FINDINGS Anterior circulation: No significant stenosis, proximal occlusion, aneurysm, or vascular malformation. Posterior circulation: No significant stenosis, proximal occlusion, aneurysm, or vascular malformation. Venous sinuses: Unremarkable Anatomic variants: None significant Review of the MIP images confirms the above findings IMPRESSION: 1. No  emergent finding or specific cause for headache. 2. Fibromuscular dysplasia seen in the right cervical vertebral artery. Electronically Signed   By: Tiburcio Pea M.D.   On: 03/03/2023 07:50    Procedures Procedures    Medications Ordered in ED Medications  lactated ringers bolus 1,000 mL (1,000 mLs Intravenous New Bag/Given 03/03/23 0647)  prochlorperazine (COMPAZINE) injection 10 mg (10 mg Intravenous Given 03/03/23 0649)  diphenhydrAMINE (BENADRYL) injection 25 mg (25 mg Intravenous Given 03/03/23 0648)  iohexol (OMNIPAQUE) 350 MG/ML injection 75 mL (75 mLs Intravenous Contrast Given 03/03/23 0719)    ED Course/ Medical Decision Making/ A&P                                 Medical Decision Making Amount and/or Complexity of Data Reviewed Labs: ordered. Radiology: ordered.  Risk Prescription drug management.  Overall low suspicion for serious causes for headache however it is new and lasted longer than previous headaches and she does have some hypertension and history of breast cancer so we will get CT scans to make sure she has any metastasis there, bleed or other etiology.  Will treat with migraine cocktail.  CT reassuring.  Patient just got her migraine cocktail.  She states that started to feel a bit better.  Pending completion of fluids and reevaluation for likely discharge and neurologic follow-up for headache.   Final Clinical Impression(s) / ED Diagnoses Final diagnoses:  None    Rx / DC Orders ED Discharge Orders     None         Orel Hord, Barbara Cower, MD 03/05/23 [phone]

## 2023-03-15 ENCOUNTER — Telehealth: Payer: Self-pay

## 2023-03-15 NOTE — Progress Notes (Signed)
 Transition Care Management Unsuccessful Follow-up Telephone Call  Date of discharge and from where:  03/03/2023 [facility]  Attempts:  1st Attempt  Reason for unsuccessful TCM follow-up call:  No answer/busy  Kelton Bultman [facility]  [facility], [facility] Resource Care Guide Direct Dial: [phone]  Website: [email or web address]

## 2023-03-18 ENCOUNTER — Telehealth: Payer: Self-pay

## 2023-03-18 NOTE — Progress Notes (Signed)
 Transition Care Management Follow-up Telephone Call Date of discharge and from where: 03/03/2023 [facility] How have you been since you were released from the hospital? Patient stated she is feeling pretty good. Any questions or concerns? No  Items Reviewed: Did the pt receive and understand the discharge instructions provided? Yes  Medications obtained and verified? Yes  Other? No  Any new allergies since your discharge? No  Dietary orders reviewed? Yes Do you have support at home? Yes   Follow up appointments reviewed:  PCP Hospital f/u appt confirmed? Yes  Scheduled to see Hilario Terry Wilhelmena Lloyd, FNP on 06/29/2023 @ [facility]. Specialist Hospital f/u appt confirmed? Yes  Scheduled to see Camie Sevin, PA-C on 04/19/2023 @ [facility]. Are transportation arrangements needed? No  If their condition worsens, is the pt aware to call PCP or go to the Emergency Dept.? Yes Was the patient provided with contact information for the PCP's office or ED? Yes Was to pt encouraged to call back with questions or concerns? Yes   Chrishawn Boley [facility]  [facility], [facility] Guide Direct Dial: [phone]  Website: [email or web address]

## 2023-03-27 ENCOUNTER — Other Ambulatory Visit: Payer: Self-pay | Admitting: Family Medicine

## 2023-04-19 ENCOUNTER — Ambulatory Visit: Payer: BC Managed Care – PPO | Admitting: Physician Assistant

## 2023-04-19 ENCOUNTER — Encounter: Payer: Self-pay | Admitting: Physician Assistant

## 2023-04-19 NOTE — Progress Notes (Incomplete)
Assessment/Plan:   Memory Impairment   Jennifer Harvey is a very pleasant 63 y.o. RH female with a history of hypertension, hyperlipidemia, depression, history of breast cancer on tamoxifen, R kidney cancer, Vit D deficiency, OSA unable to tolerate CPAP  presenting today in follow-up for evaluation of memory loss. Patient is on ***     Recommendations:   Follow up in   months. Continue B1 and B12 supplements Recommend good control of cardiovascular risk factors Continue to control mood as per PCP, recommend psychotherapy Alcohol and marijuana cessation counseled. Agree with CPAP for OSA    Subjective:   This patient is accompanied in the office by ***  who supplements the history. Previous records as well as any outside records available were reviewed prior to todays visit.   Patient was last seen on 01/13/2023 via video visit. Last MoCA 20/30 on 08/2022. ***    Any changes in memory since last visit? ". repeats oneself?  Endorsed "not as much " Disoriented when walking into a room?  Patient denies ***  Misplacing objects?  Patient denies   Wandering behavior?   denies   Any personality changes since last visit?   denies   Any worsening depression?: denies   Hallucinations or paranoia?  denies   Seizures?   denies    Any sleep changes? Sleeps well***. Does not sleep very well***.   Denies vivid dreams, REM behavior or sleepwalking   Sleep apnea?   Endorsed, followed by pulmonology.  She is entertaining CPAP***  Any hygiene concerns?   denies   Independent of bathing and dressing?  Endorsed  Does the patient needs help with medications? Patient is in charge, uses pill pack*** Who is in charge of the finances?  Patient is in charge   *** Any changes in appetite?  denies ***   Patient have trouble swallowing?  denies   Does the patient cook?  Any kitchen accidents such as leaving the stove on?   denies   Any headaches?   Intermittent headaches, in December she  presented to the ER with this complaint, but completely negative workup.  All the labs were reassuring.  Etiology was unclear.  She received migraine cocktail with complete resolution. Denies tinnitus*** Vision changes? denies Chronic pain?  denies   Ambulates with difficulty?    denies ***  Recent falls or head injuries?    denies      Unilateral weakness, numbness or tingling?   denies   Any tremors?  denies   Any anosmia?    denies   Any incontinence of urine?  denies   Any bowel dysfunction?  denies      Patient lives with   *** Does the patient drive?*** Alcohol? Marijuana?  MRI brain personally reviewed 08/2022 remarkable for mild chronic small vessel disease, no acute findings, no atrophy    Initial visit 09/09/22 How long did patient have memory difficulties?  For the last 3 years, when I started taking tamoxifen with some difficulty remembering recent conversations and people names .I  am making poor decisions, I am "foggy brain". "I gave my mama the wrong medicine".  She states that he enjoys going to church, but does not participate in the interim or different activities that the charge provides.  Patient is about to be on disability due to cancer, and has not been very active at home. repeats oneself? Denies Disoriented when walking into a room?"I have to focus to see who is in the room  so I know where I am".   Leaving objects in unusual places?  She misplaces things and cannot find it.   Wandering behavior?  denies   Any personality changes? Yes, sometimes I get angry, especially if someone keeps calling my name"  Any history of depression?:  Endorsed, but has not been seen by therapist. "The only thing it helps me is marijuana, relaxes me and can think better".  She reports that she has difficulty in her marriage, and she is considering divorce.  At that point, the patient began to sob.  Hallucinations or paranoia? Endorsed, 2 weeks ago "I was at home watching TV I saw somebody  under the couch but was no one there" Seizures?   Patient denies    Any sleep changes? Does not sleep well.  She has OSA, cannot tolerate the CPAP.  Endorse vivid dreams of falling, death in the family, etc, REM behavior or sleepwalking   Any hygiene concerns? "I used to shower more and dress better, but now I don't really care, since tamoxifen".    Independent of bathing and dressing?  Endorsed  Does the patient needs help with medications? Patient is in charge, sometimes she may forget   Who is in charge of the finances? Husband and her mother in charge     Any changes in appetite? I am eating more     Patient have trouble swallowing? denies   Does the patient cook? Yes  Any kitchen accidents such as leaving the stove on? denies   Any headaches?   denies   Chronic back pain ? Endorsed L hip pain, takes gabapentin for pain as per PCP. Has never done PT Ambulates with difficulty?  denies that she admits to being deconditioned Recent falls or head injuries? denies   Vision changes? denies   Unilateral weakness, numbness or tingling? denies   Any tremors?   denies   Any anosmia?  denies   Any incontinence of urine? denies   Any bowel dysfunction?  Both  Patient lives  with husband and her mother  History of heavy alcohol intake?  Drinks 2 large cans of beer  a day  History of heavy tobacco use?  She does not smoke cigarettes, but does marijuana 2 joints a day Family history of dementia? denies  Does patient drive? Yes, denies any issues such as getting lost .  Past Medical History:  Diagnosis Date   Breast cancer (HCC)    Cancer (HCC)    renal cell carcinoma 2007 s/p partial right nephrectomy    History of kidney cancer    Hypertension    Personal history of chemotherapy    Personal history of radiation therapy    Sleep apnea      Past Surgical History:  Procedure Laterality Date   BIOPSY  09/21/2022   Procedure: BIOPSY;  Surgeon: Lanelle Bal, DO;  Location: [facility];   Service: Endoscopy;;   BREAST LUMPECTOMY Right 2019   BREAST SURGERY     CESAREAN SECTION  1994   COLONOSCOPY     COLONOSCOPY WITH PROPOFOL N/A 09/21/2022   Procedure: COLONOSCOPY WITH PROPOFOL;  Surgeon: Lanelle Bal, DO;  Location: [facility];  Service: Endoscopy;  Laterality: N/A;  random colon biopsies, 145pm, asa 2   HYSTEROSCOPY WITH D & C N/A 03/12/2020   Procedure: Hysteroscopy Uterine Curettage;  Surgeon: Lazaro Arms, MD;  Location: [facility];  Service: Gynecology;  Laterality: N/A;   HYSTEROSCOPY WITH D &  C N/A 11/11/2020   Procedure: DILATATION AND CURETTAGE /HYSTEROSCOPY;  Surgeon: Myna Hidalgo, DO;  Location: [facility];  Service: Gynecology;  Laterality: N/A;   partial right nephrectomy  2007   POLYPECTOMY N/A 11/11/2020   Procedure: Myosure POLYPECTOMY;  Surgeon: Myna Hidalgo, DO;  Location: [facility];  Service: Gynecology;  Laterality: N/A;   POLYPECTOMY  09/21/2022   Procedure: POLYPECTOMY;  Surgeon: Lanelle Bal, DO;  Location: [facility];  Service: Endoscopy;;     PREVIOUS MEDICATIONS:   CURRENT MEDICATIONS:  Outpatient Encounter Medications as of 04/19/2023  Medication Sig   albuterol (PROVENTIL) (2.5 MG/3ML) 0.083% nebulizer solution Take 3 mLs (2.5 mg total) by nebulization every 6 (six) hours as needed for wheezing or shortness of breath.   albuterol (VENTOLIN HFA) 108 (90 Base) MCG/ACT inhaler Inhale 1-2 puffs into the lungs every 6 (six) hours as needed for wheezing or shortness of breath.   amLODipine (NORVASC) 5 MG tablet Take 1 tablet (5 mg total) by mouth daily.   benazepril (LOTENSIN) 10 MG tablet Take 1 tablet (10 mg total) by mouth daily.   cyanocobalamin (VITAMIN B12) 1000 MCG tablet Take 1,000 mcg by mouth daily.   ergocalciferol (VITAMIN D2) 1.25 MG (50000 UT) capsule Take 1 capsule (50,000 Units total) by mouth once a week.   Fezolinetant (VEOZAH) 45 MG TABS Take 1 tablet (45 mg total) by mouth at bedtime.   fluticasone-salmeterol (ADVAIR)  100-50 MCG/ACT AEPB Inhale 1 puff into the lungs 2 (two) times daily.   gabapentin (NEURONTIN) 300 MG capsule Take 1 capsule (300 mg total) by mouth 3 (three) times daily as needed.   metFORMIN (GLUCOPHAGE) 500 MG tablet Take 1 tablet (500 mg total) by mouth 2 (two) times daily with a meal.   methocarbamol (ROBAXIN) 500 MG tablet Take 1 tablet (500 mg total) by mouth 2 (two) times daily as needed for muscle spasms.   montelukast (SINGULAIR) 10 MG tablet Take 1 tablet (10 mg total) by mouth at bedtime.   omeprazole (PRILOSEC) 40 MG capsule Take 40 mg by mouth every morning.   oxyCODONE-acetaminophen (PERCOCET/ROXICET) 5-325 MG tablet Take one every 6 hours for severe headache not relieved by tylenol or motrin   sertraline (ZOLOFT) 25 MG tablet Take 1 tablet (25 mg total) by mouth daily.   Thiamine HCl (VITAMIN B-1) 250 MG tablet Take 250 mg by mouth daily.   venlafaxine (EFFEXOR) 75 MG tablet TAKE 1 TABLET(75 MG) BY MOUTH DAILY   No facility-administered encounter medications on file as of 04/19/2023.     Objective:     PHYSICAL EXAMINATION:    VITALS:  There were no vitals filed for this visit.  GEN:  The patient appears stated age and is in NAD. HEENT:  Normocephalic, atraumatic.   Neurological examination:  General: NAD, well-groomed, appears stated age. Orientation: The patient is alert. Oriented to person, place and date Cranial nerves: There is good facial symmetry.The speech is fluent and clear. No aphasia or dysarthria. Fund of knowledge is appropriate. Recent memory impaired and remote memory is normal.  Attention and concentration are normal.  Able to name objects and repeat phrases.  Hearing is intact to conversational tone ***.   Delayed recall *** Sensation: Sensation is intact to light touch throughout Motor: Strength is at least antigravity x4. DTR's 2/4 in UE/LE      09/09/2022   12:00 PM  Montreal Cognitive Assessment   Visuospatial/ Executive (0/5) 4  Naming (0/3)  3  Attention: Read list  of digits (0/2) 2  Attention: Read list of letters (0/1) 1  Attention: Serial 7 subtraction starting at 100 (0/3) 0  Language: Repeat phrase (0/2) 0  Language : Fluency (0/1) 0  Abstraction (0/2) 0  Delayed Recall (0/5) 3  Orientation (0/6) 6  Total 19  Adjusted Score (based on education) 20        No data to display             Movement examination: Tone: There is normal tone in the UE/LE Abnormal movements:  no tremor.  No myoclonus.  No asterixis.   Coordination:  There is no decremation with RAM's. Normal finger to nose  Gait and Station: The patient has no difficulty arising out of a deep-seated chair without the use of the hands. The patient's stride length is good.  Gait is cautious and narrow.   Thank you for allowing [place] the opportunity to participate in the care of this nice patient. Please do not hesitate to contact us for any questions or concerns.   Total time spent on today's visit was *** minutes dedicated to this patient today, preparing to see patient, examining the patient, ordering tests and/or medications and counseling the patient, documenting clinical information in the EHR or other health record, independently interpreting results and communicating results to the patient/family, discussing treatment and goals, answering patient's questions and coordinating care.  Cc:  Del Nigel Berthold, FNP  Marlowe Kays 04/19/2023 6:30 AM

## 2023-04-29 ENCOUNTER — Emergency Department (HOSPITAL_COMMUNITY)
Admission: EM | Admit: 2023-04-29 | Discharge: 2023-04-29 | Disposition: A | Discharge status: Home / Self Care | Payer: BC Managed Care – PPO | Attending: Emergency Medicine | Admitting: Emergency Medicine

## 2023-04-29 ENCOUNTER — Other Ambulatory Visit: Payer: Self-pay

## 2023-04-29 ENCOUNTER — Encounter (HOSPITAL_COMMUNITY): Payer: Self-pay | Admitting: Emergency Medicine

## 2023-04-29 DIAGNOSIS — B029 Zoster without complications: Secondary | ICD-10-CM | POA: Diagnosis not present

## 2023-04-29 DIAGNOSIS — I10 Essential (primary) hypertension: Secondary | ICD-10-CM | POA: Insufficient documentation

## 2023-04-29 DIAGNOSIS — Z85528 Personal history of other malignant neoplasm of kidney: Secondary | ICD-10-CM | POA: Diagnosis not present

## 2023-04-29 DIAGNOSIS — Z79899 Other long term (current) drug therapy: Secondary | ICD-10-CM | POA: Insufficient documentation

## 2023-04-29 DIAGNOSIS — R21 Rash and other nonspecific skin eruption: Secondary | ICD-10-CM | POA: Diagnosis not present

## 2023-04-29 MED ORDER — VALACYCLOVIR HCL 1 G PO TABS: 1000.0000 mg | ORAL_TABLET | Freq: Three times a day (TID) | ORAL | 0 refills | Status: AC

## 2023-04-29 MED ORDER — HYDROCODONE-ACETAMINOPHEN 5-325 MG PO TABS: 1.0000 | ORAL_TABLET | Freq: Four times a day (QID) | ORAL | 0 refills | Status: DC | PRN

## 2023-04-29 MED ORDER — VALACYCLOVIR HCL 500 MG PO TABS: 500.0000 mg | ORAL_TABLET | ORAL | Status: DC

## 2023-04-29 MED ORDER — GABAPENTIN 100 MG PO CAPS: 100.0000 mg | ORAL_CAPSULE | Freq: Three times a day (TID) | ORAL | 0 refills | Status: DC

## 2023-04-29 MED ORDER — GABAPENTIN 100 MG PO CAPS: 100.0000 mg | ORAL_CAPSULE | Freq: Once | ORAL | Status: DC

## 2023-04-29 MED ORDER — VALACYCLOVIR HCL 500 MG PO TABS
1000.0000 mg | ORAL_TABLET | ORAL | Status: AC
  Administered 2023-04-29: 1000 mg via ORAL
  Filled 2023-04-29: qty 2

## 2023-04-29 NOTE — ED Triage Notes (Signed)
Pt c/o rash to the left side of her face x 3 days. Has tried OTC meds without relief.

## 2023-04-29 NOTE — Discharge Instructions (Addendum)
It was a pleasure taking part in your care.  As we discussed, I believe that your rash is due to shingles.  Please begin taking valacyclovir 3 times a day, 1 gram for the next 7 days.  Please also begin taking gabapentin 3 times a day 100mg  for the next 10 days.  Please take hydrocodone pain medication every 6 hours as needed for pain.  Please follow-up with your PCP.  If you begin to have blurred vision, or if the rash spreads to your nose please return to the ED for further management and care.  Please read attached guide concerning shingles.

## 2023-04-29 NOTE — ED Provider Notes (Signed)
[place] EMERGENCY DEPARTMENT AT [facility] Provider Note   CSN: [id number] Arrival date & time: 04/29/23  4098     History  Chief Complaint  Patient presents with   Rash    Jennifer Harvey is a 63 y.o. female with medical history of kidney cancer in remission, hypertension, sleep apnea.  The patient presents to ED for evaluation of left-sided facial rash.  Reports over the last 3 days she has had progressively worsening left-sided facial rash.  States that the rash burns, stings.  Denies any new lotions, detergents, soaps at home.  Denies any new animals in the household.  Denies a history of allergic reactions.  Denies trouble swallowing, change in phonation.  She denies nausea, vomiting, fevers at home.  Denies any blurred vision or involvement of the tip of her nose.   Rash      Home Medications Prior to Admission medications   Medication Sig Start Date End Date Taking? Authorizing Provider  gabapentin (NEURONTIN) 100 MG capsule Take 1 capsule (100 mg total) by mouth 3 (three) times daily. 04/29/23  Yes Al Decant, PA-C  HYDROcodone-acetaminophen (NORCO/VICODIN) 5-325 MG tablet Take 1 tablet by mouth every 6 (six) hours as needed for moderate pain (pain score 4-6). 04/29/23  Yes Al Decant, PA-C  valACYclovir (VALTREX) 1000 MG tablet Take 1 tablet (1,000 mg total) by mouth 3 (three) times daily for 14 days. 04/29/23 05/13/23 Yes Al Decant, PA-C  albuterol (PROVENTIL) (2.5 MG/3ML) 0.083% nebulizer solution Take 3 mLs (2.5 mg total) by nebulization every 6 (six) hours as needed for wheezing or shortness of breath. 02/08/23   Sloan Leiter, DO  albuterol (VENTOLIN HFA) 108 (90 Base) MCG/ACT inhaler Inhale 1-2 puffs into the lungs every 6 (six) hours as needed for wheezing or shortness of breath. 02/08/23   Sloan Leiter, DO  amLODipine (NORVASC) 5 MG tablet Take 1 tablet (5 mg total) by mouth daily. 03/01/23   Del Nigel Berthold,  FNP  benazepril (LOTENSIN) 10 MG tablet Take 1 tablet (10 mg total) by mouth daily. 08/11/22   Del Nigel Berthold, FNP  cyanocobalamin (VITAMIN B12) 1000 MCG tablet Take 1,000 mcg by mouth daily.    [provider]  ergocalciferol (VITAMIN D2) 1.25 MG (50000 UT) capsule Take 1 capsule (50,000 Units total) by mouth once a week. 02/24/23   Doreatha Massed, MD  Fezolinetant (VEOZAH) 45 MG TABS Take 1 tablet (45 mg total) by mouth at bedtime. 02/17/23   Lazaro Arms, MD  fluticasone-salmeterol (ADVAIR) 100-50 MCG/ACT AEPB Inhale 1 puff into the lungs 2 (two) times daily. 03/01/23   Del Nigel Berthold, FNP  metFORMIN (GLUCOPHAGE) 500 MG tablet Take 1 tablet (500 mg total) by mouth 2 (two) times daily with a meal. 03/02/23   Del Newman Nip, Tenna Child, FNP  methocarbamol (ROBAXIN) 500 MG tablet Take 1 tablet (500 mg total) by mouth 2 (two) times daily as needed for muscle spasms. 09/19/22   Eber Hong, MD  montelukast (SINGULAIR) 10 MG tablet Take 1 tablet (10 mg total) by mouth at bedtime. 03/01/23   Del Nigel Berthold, FNP  omeprazole (PRILOSEC) 40 MG capsule Take 40 mg by mouth every morning.    [provider]  oxyCODONE-acetaminophen (PERCOCET/ROXICET) 5-325 MG tablet Take one every 6 hours for severe headache not relieved by tylenol or motrin 03/03/23   Bethann Berkshire, MD  sertraline (ZOLOFT) 25 MG tablet Take 1 tablet (25 mg total) by  mouth daily. 03/01/23   Del Nigel Berthold, FNP  Thiamine HCl (VITAMIN B-1) 250 MG tablet Take 250 mg by mouth daily.    [provider]  venlafaxine (EFFEXOR) 75 MG tablet TAKE 1 TABLET(75 MG) BY MOUTH DAILY 03/28/23   Del Nigel Berthold, FNP      Allergies    Patient has no known allergies.    Review of Systems   Review of Systems  Skin:  Positive for rash.  All other systems reviewed and are negative.   Physical Exam Updated Vital Signs BP 130/81   Pulse 86   Temp 99 F (37.2 C) (Oral)   Resp  18   Ht 5\' 6"  (1.676 m)   Wt 86.6 kg   LMP 04/29/2020 (Within Weeks) Comment: Bleeding  SpO2 95%   BMI 30.83 kg/m  Physical Exam Vitals and nursing note reviewed.  Constitutional:      General: She is not in acute distress.    Appearance: She is well-developed.  HENT:     Head: Normocephalic and atraumatic.  Eyes:     Conjunctiva/sclera: Conjunctivae normal.  Cardiovascular:     Rate and Rhythm: Normal rate and regular rhythm.     Heart sounds: No murmur heard. Pulmonary:     Effort: Pulmonary effort is normal. No respiratory distress.     Breath sounds: Normal breath sounds.  Abdominal:     Palpations: Abdomen is soft.     Tenderness: There is no abdominal tenderness.  Musculoskeletal:        General: No swelling.     Cervical back: Neck supple.  Skin:    General: Skin is warm and dry.     Capillary Refill: Capillary refill takes less than 2 seconds.     Findings: Rash present.     Comments: Rash present with herpetic appearing lesions to the left side of her face following the V3 dermatome.  Neurological:     Mental Status: She is alert.  Psychiatric:        Mood and Affect: Mood normal.     ED Results / Procedures / Treatments   Labs (all labs ordered are listed, but only abnormal results are displayed) Labs Reviewed - No data to display  EKG None  Radiology No results found.  Procedures Procedures    Medications Ordered in ED Medications  gabapentin (NEURONTIN) capsule 100 mg (has no administration in time range)  valACYclovir (VALTREX) tablet 1,000 mg (has no administration in time range)    ED Course/ Medical Decision Making/ A&P  Medical Decision Making Risk Prescription drug management.   63 year old female presents to ED for evaluation.  Please see HPI for further details.  On exam patient is afebrile and nontachycardic.  Her lung sounds are clear bilaterally, she is not epoxy.  Her abdomen is soft and compressible throughout.   Neurological examinations at baseline.  Patient does have herpetic appearing lesions following the V3 dermatome of the left side of her face.  Rash is consistent with shingles.  Will start patient on valacyclovir, gabapentin.  Will also send her home with some pain medication.  Patient has a negative Hutchinson sign.  Patient discharged at this time with valacyclovir, gabapentin, hydrocodone.  Will have patient follow-up with her PCP.  Return precautions were provided and she voiced understanding.  Stable to discharge home.   Final Clinical Impression(s) / ED Diagnoses Final diagnoses:  Herpes zoster without complication    Rx / DC Orders ED Discharge Orders  Ordered    valACYclovir (VALTREX) 1000 MG tablet  3 times daily        04/29/23 2125    gabapentin (NEURONTIN) 100 MG capsule  3 times daily        04/29/23 2125    HYDROcodone-acetaminophen (NORCO/VICODIN) 5-325 MG tablet  Every 6 hours PRN        04/29/23 2125              Clent Ridges 04/29/23 2127    Loetta Rough, MD 04/29/23 2211

## 2023-06-01 ENCOUNTER — Telehealth: Payer: Self-pay

## 2023-06-01 NOTE — Telephone Encounter (Signed)
 Patient was identified as falling into the [place] Measure - Diabetes.   Patient was: Appointment scheduled for lab or office visit for A1c.

## 2023-06-15 ENCOUNTER — Encounter: Payer: Self-pay | Admitting: Family Medicine

## 2023-06-15 ENCOUNTER — Ambulatory Visit: Payer: BC Managed Care – PPO | Admitting: Family Medicine

## 2023-06-15 VITALS — BP 131/81 | HR 83 | Ht 66.0 in | Wt 191.0 lb

## 2023-06-15 DIAGNOSIS — Z7984 Long term (current) use of oral hypoglycemic drugs: Secondary | ICD-10-CM | POA: Diagnosis not present

## 2023-06-15 DIAGNOSIS — E119 Type 2 diabetes mellitus without complications: Secondary | ICD-10-CM | POA: Insufficient documentation

## 2023-06-15 DIAGNOSIS — I10 Essential (primary) hypertension: Secondary | ICD-10-CM | POA: Diagnosis not present

## 2023-06-15 DIAGNOSIS — E559 Vitamin D deficiency, unspecified: Secondary | ICD-10-CM | POA: Diagnosis not present

## 2023-06-15 DIAGNOSIS — E1169 Type 2 diabetes mellitus with other specified complication: Secondary | ICD-10-CM

## 2023-06-15 MED ORDER — SERTRALINE HCL 25 MG PO TABS: 25.0000 mg | ORAL_TABLET | Freq: Every day | ORAL | 3 refills | Status: DC

## 2023-06-15 MED ORDER — GABAPENTIN 600 MG PO TABS
600.0000 mg | ORAL_TABLET | Freq: Two times a day (BID) | ORAL | 3 refills | Status: DC | PRN
Start: 2023-06-15 — End: 2024-02-02

## 2023-06-15 NOTE — Assessment & Plan Note (Signed)
 Last Hemoglobin A1c: 9.6 Labs: Ordered today, results pending; will follow up accordingly. The patient reports adhering to prescribed medications: Metformin 500 mg twice daily  Reviewed non-pharmacological interventions, including a balanced diet rich in lean proteins, healthy fats, whole grains, and high-fiber vegetables. Emphasized reducing refined sugars and processed carbohydrates, and incorporating more fruits, leafy greens, and legumes. Education: Patient was educated on recognizing signs and symptoms of both hypoglycemia and hyperglycemia, and advised to seek emergency care if these symptoms occur. Follow-Up: Scheduled for follow-up in 3-4 months, or sooner if needed. Patient Understanding: The patient verbalized understanding of the care plan, and all questions were answered. Additional Care: Ophthalmology referral was placed. Foot exam results were within normal limits.

## 2023-06-15 NOTE — Progress Notes (Signed)
 Established Patient Office Visit   Subjective  Patient ID: Jennifer Harvey, female    DOB: May 11, 1960  Age: 63 y.o. MRN: [id number]  Chief Complaint  Patient presents with   Care Management    Follow up, reports sx of shingles started last week of February. Still having some sx.     She  has a past medical history of Breast cancer (HCC), Cancer (HCC), History of kidney cancer, Hypertension, Personal history of chemotherapy, Personal history of radiation therapy, and Sleep apnea.  HPI Patient presents to the clinic for chronic follow up. For the details of today's visit, please refer to assessment and plan.   Review of Systems  Constitutional:  Negative for chills and fever.  Respiratory:  Negative for shortness of breath.   Cardiovascular:  Negative for chest pain.  Gastrointestinal:  Negative for abdominal pain.  Genitourinary:  Negative for dysuria.  Musculoskeletal:  Positive for myalgias.  Neurological:  Negative for dizziness and headaches.      Objective:     BP 131/81   Pulse 83   Ht 5\' 6"  (1.676 m)   Wt 191 lb (86.6 kg)   LMP 04/29/2020 (Within Weeks) Comment: Bleeding  SpO2 93%   BMI 30.83 kg/m  BP Readings from Last 3 Encounters:  06/15/23 131/81  04/29/23 (!) 148/81  03/03/23 (!) 149/98      Physical Exam Vitals reviewed.  Constitutional:      General: She is not in acute distress.    Appearance: Normal appearance. She is not ill-appearing, toxic-appearing or diaphoretic.  HENT:     Head: Normocephalic.  Eyes:     General:        Right eye: No discharge.        Left eye: No discharge.     Conjunctiva/sclera: Conjunctivae normal.  Cardiovascular:     Rate and Rhythm: Normal rate.     Pulses: Normal pulses.     Heart sounds: Normal heart sounds.  Pulmonary:     Effort: Pulmonary effort is normal. No respiratory distress.     Breath sounds: Normal breath sounds.  Abdominal:     General: Bowel sounds are normal.     Palpations: Abdomen  is soft.     Tenderness: There is no abdominal tenderness. There is no right CVA tenderness, left CVA tenderness or guarding.  Musculoskeletal:        General: Normal range of motion.     Cervical back: Normal range of motion.  Skin:    General: Skin is warm and dry.     Capillary Refill: Capillary refill takes less than 2 seconds.  Neurological:     Mental Status: She is alert.     Coordination: Coordination normal.     Gait: Gait normal.  Psychiatric:        Mood and Affect: Mood normal.        Behavior: Behavior normal.      No results found for any visits on 06/15/23.  The 10-year ASCVD risk score (Arnett DK, et al., 2019) is: 13.2%    Assessment & Plan:  Type 2 diabetes mellitus without complication, without long-term current use of insulin (HCC) Assessment & Plan: Last Hemoglobin A1c: 9.6 Labs: Ordered today, results pending; will follow up accordingly. The patient reports adhering to prescribed medications: Metformin 500 mg twice daily  Reviewed non-pharmacological interventions, including a balanced diet rich in lean proteins, healthy fats, whole grains, and high-fiber vegetables. Emphasized reducing refined sugars and processed carbohydrates,  and incorporating more fruits, leafy greens, and legumes. Education: Patient was educated on recognizing signs and symptoms of both hypoglycemia and hyperglycemia, and advised to seek emergency care if these symptoms occur. Follow-Up: Scheduled for follow-up in 3-4 months, or sooner if needed. Patient Understanding: The patient verbalized understanding of the care plan, and all questions were answered. Additional Care: Ophthalmology referral was placed. Foot exam results were within normal limits.   Orders: -     Hemoglobin A1c  Primary hypertension Assessment & Plan: Vitals:   06/15/23 0831  BP: 131/81  Controlled, Continue with amlodipine 5 mg once daily and benazepril 10 mg daily  Labs ordered. Discussed with  patient  to monitor their blood pressure regularly and maintain a heart-healthy diet rich in fruits, vegetables, whole grains, and low-fat dairy, while reducing sodium intake to less than 2,300 mg per day. Regular physical activity, such as 30 minutes of moderate exercise most days of the week, will help lower blood pressure and improve overall cardiovascular health. Avoiding smoking, limiting alcohol consumption, and managing stress. Take  prescribed medication, & take it as directed and avoid skipping doses. Seek emergency care if your blood pressure is (over 180/100) or you experience chest pain, shortness of breath, or sudden vision changes.Patient verbalizes understanding regarding plan of care and all questions answered.   Orders: -     BMP8+eGFR -     CBC with Differential/Platelet -     Lipid panel  Vitamin D deficiency -     VITAMIN D 25 Hydroxy (Vit-D Deficiency, Fractures)  Other orders -     Gabapentin; Take 1 tablet (600 mg total) by mouth 2 (two) times daily as needed.  Dispense: 60 tablet; Refill: 3 -     Sertraline HCl; Take 1 tablet (25 mg total) by mouth daily.  Dispense: 30 tablet; Refill: 3    Return in about 4 months (around 10/15/2023), or if symptoms worsen or fail to improve, for type 2 diabetes.   Cruzita Lederer Newman Nip, FNP

## 2023-06-15 NOTE — Patient Instructions (Signed)

## 2023-06-15 NOTE — Assessment & Plan Note (Signed)
 Vitals:   06/15/23 0831  BP: 131/81  Controlled, Continue with amlodipine 5 mg once daily and benazepril 10 mg daily  Labs ordered. Discussed with  patient to monitor their blood pressure regularly and maintain a heart-healthy diet rich in fruits, vegetables, whole grains, and low-fat dairy, while reducing sodium intake to less than 2,300 mg per day. Regular physical activity, such as 30 minutes of moderate exercise most days of the week, will help lower blood pressure and improve overall cardiovascular health. Avoiding smoking, limiting alcohol consumption, and managing stress. Take  prescribed medication, & take it as directed and avoid skipping doses. Seek emergency care if your blood pressure is (over 180/100) or you experience chest pain, shortness of breath, or sudden vision changes.Patient verbalizes understanding regarding plan of care and all questions answered.

## 2023-06-16 ENCOUNTER — Encounter: Payer: Self-pay | Admitting: Family Medicine

## 2023-06-16 ENCOUNTER — Other Ambulatory Visit: Payer: Self-pay | Admitting: Family Medicine

## 2023-06-16 LAB — CBC WITH DIFFERENTIAL/PLATELET
Basophils Absolute: 0.1 10*3/uL (ref 0.0–0.2)
Basos: 1 %
EOS (ABSOLUTE): 0.1 10*3/uL (ref 0.0–0.4)
Eos: 3 %
Hematocrit: 39.1 % (ref 34.0–46.6)
Hemoglobin: 13.3 g/dL (ref 11.1–15.9)
Immature Grans (Abs): 0 10*3/uL (ref 0.0–0.1)
Immature Granulocytes: 0 %
Lymphocytes Absolute: 1.9 10*3/uL (ref 0.7–3.1)
Lymphs: 46 %
MCH: 29.9 pg (ref 26.6–33.0)
MCHC: 34 g/dL (ref 31.5–35.7)
MCV: 88 fL (ref 79–97)
Monocytes Absolute: 0.4 10*3/uL (ref 0.1–0.9)
Monocytes: 9 %
Neutrophils Absolute: 1.7 10*3/uL (ref 1.4–7.0)
Neutrophils: 41 %
Platelets: 276 10*3/uL (ref 150–450)
RBC: 4.45 x10E6/uL (ref 3.77–5.28)
RDW: 14.5 % (ref 11.7–15.4)
WBC: 4.1 10*3/uL (ref 3.4–10.8)

## 2023-06-16 LAB — LIPID PANEL
Chol/HDL Ratio: 3.9 ratio (ref 0.0–4.4)
Cholesterol, Total: 189 mg/dL (ref 100–199)
HDL: 48 mg/dL (ref >39)
LDL Chol Calc (NIH): 125 mg/dL — ABNORMAL HIGH (ref 0–99)
Triglycerides: 86 mg/dL (ref 0–149)
VLDL Cholesterol Cal: 16 mg/dL (ref 5–40)

## 2023-06-16 LAB — HEMOGLOBIN A1C
Est. average glucose Bld gHb Est-mCnc: 117 mg/dL
Hgb A1c MFr Bld: 5.7 % — ABNORMAL HIGH (ref 4.8–5.6)

## 2023-06-16 LAB — BMP8+EGFR
BUN/Creatinine Ratio: 12 (ref 12–28)
BUN: 8 mg/dL (ref 8–27)
CO2: 25 mmol/L (ref 20–29)
Calcium: 9.8 mg/dL (ref 8.7–10.3)
Chloride: 105 mmol/L (ref 96–106)
Creatinine, Ser: 0.66 mg/dL (ref 0.57–1.00)
Glucose: 104 mg/dL — ABNORMAL HIGH (ref 70–99)
Potassium: 4.3 mmol/L (ref 3.5–5.2)
Sodium: 145 mmol/L — ABNORMAL HIGH (ref 134–144)
eGFR: 100 mL/min/{1.73_m2} (ref >59)

## 2023-06-16 LAB — VITAMIN D 25 HYDROXY (VIT D DEFICIENCY, FRACTURES): Vit D, 25-Hydroxy: 21.9 ng/mL — ABNORMAL LOW (ref 30.0–100.0)

## 2023-06-16 MED ORDER — ROSUVASTATIN CALCIUM 5 MG PO TABS: 5.0000 mg | ORAL_TABLET | Freq: Every day | ORAL | 3 refills | Status: AC

## 2023-06-17 ENCOUNTER — Telehealth: Payer: Self-pay

## 2023-06-17 NOTE — Telephone Encounter (Signed)
 Copied from CRM [id number]. Topic: Clinical - Lab/Test Results >> Jun 17, 2023  2:57 PM Abundio Miu S wrote: Reason for CRM: Patient calling to obtain lab results. Read results verbatim per provider's note. Patient verbalized understanding and has additional questions. Patient requesting a callback.

## 2023-06-29 ENCOUNTER — Ambulatory Visit: Payer: BC Managed Care – PPO | Admitting: Family Medicine

## 2023-06-30 ENCOUNTER — Encounter: Payer: Self-pay | Admitting: Gastroenterology

## 2023-07-29 IMAGING — DX DG CHEST 2V
2 series · 2 of 2 positions shown · non-contrast
Comparison: None.

CLINICAL DATA: Palpitations.

EXAM:
CHEST - 2 VIEW

[chest pa]
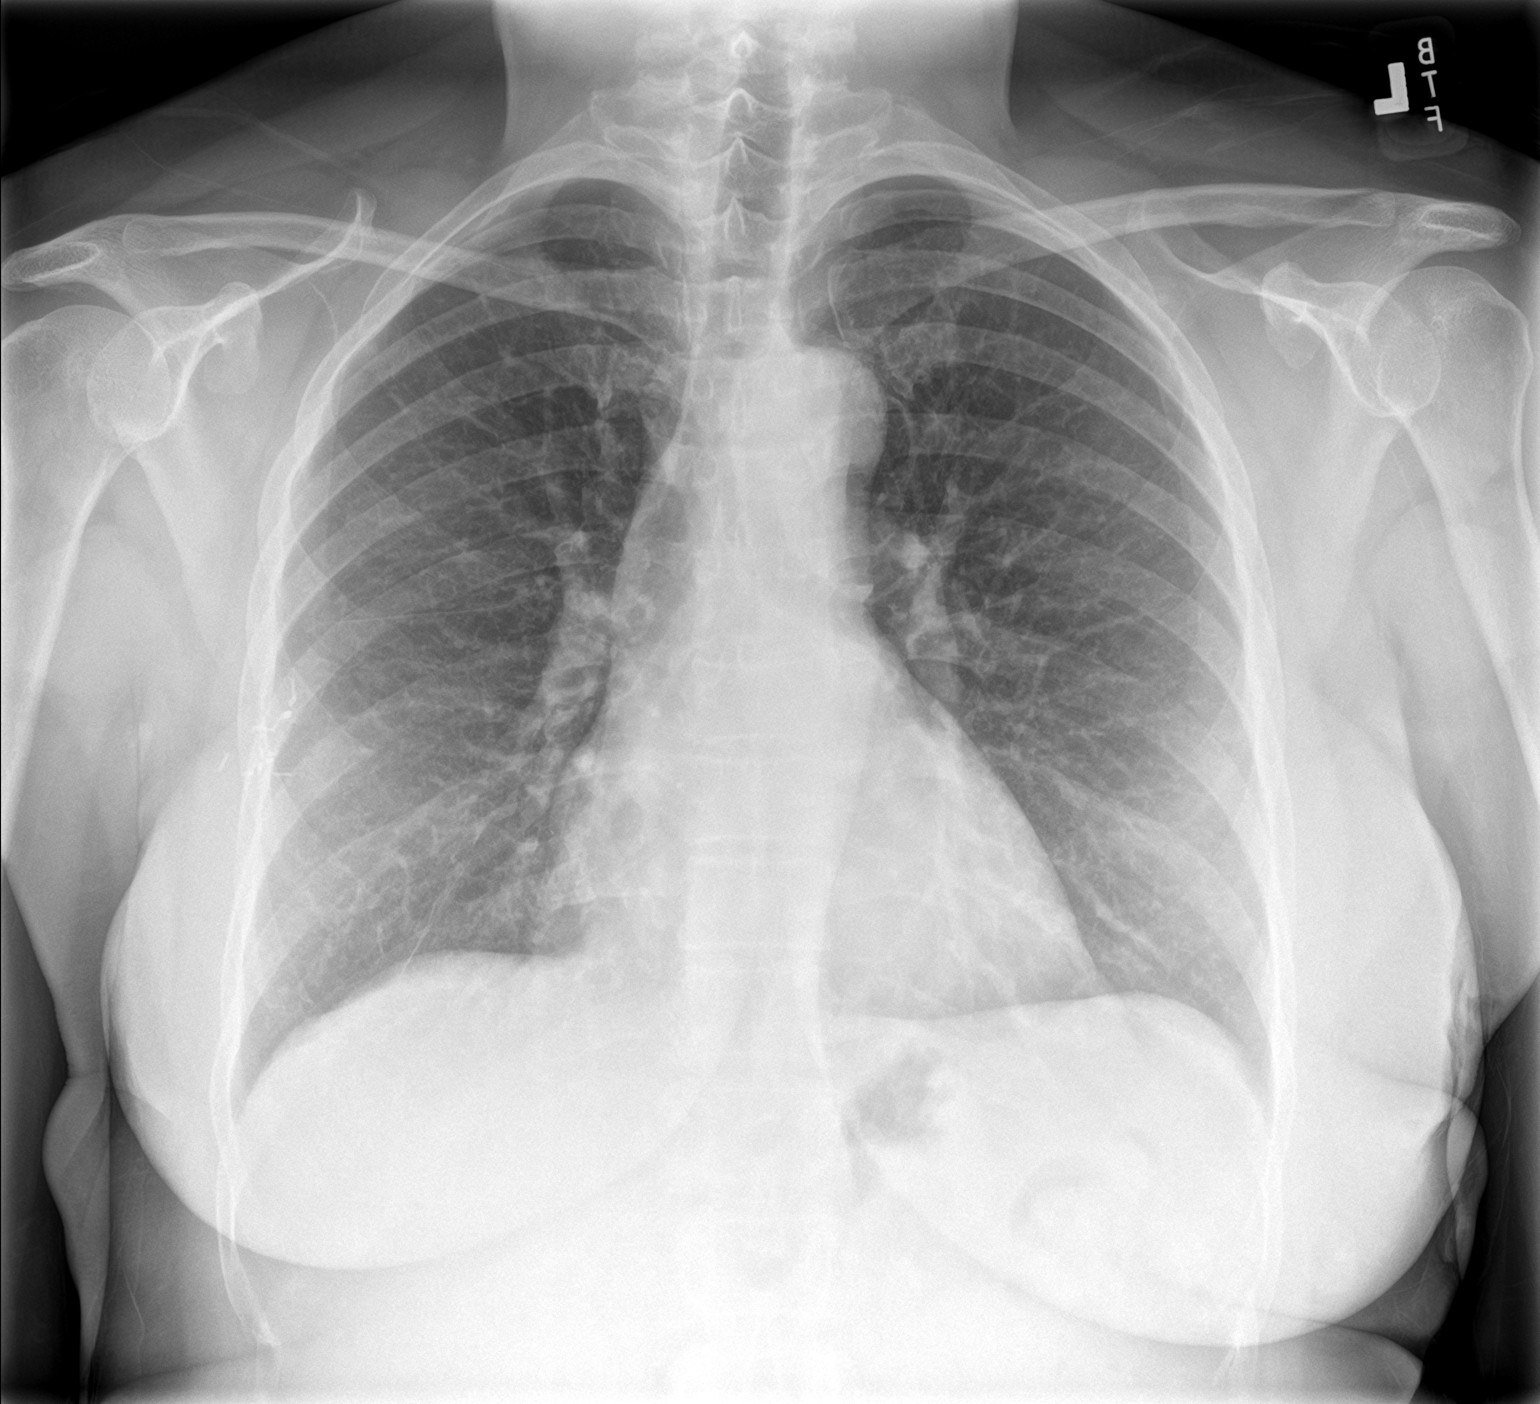

[chest lat]
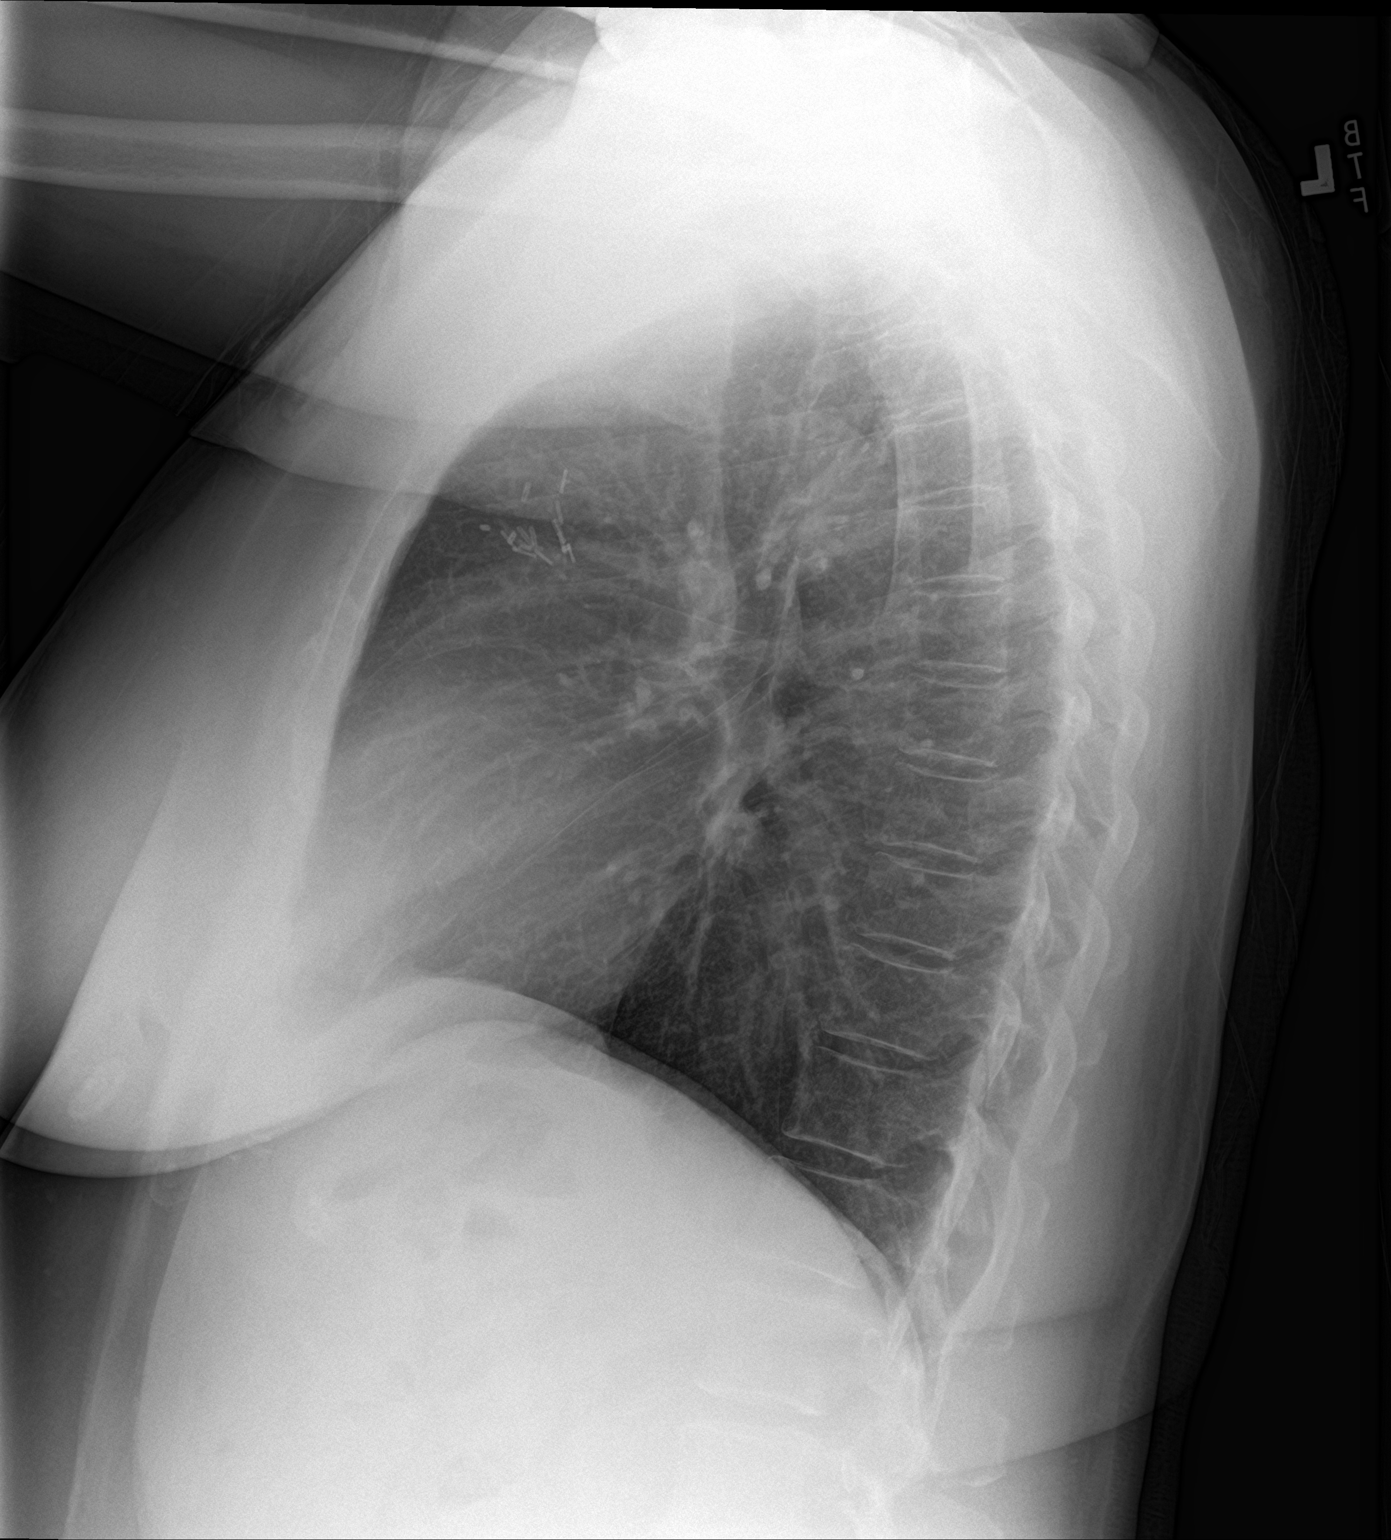

[2 of 2 positions shown; findings below may reference images not displayed]

FINDINGS: The heart size and mediastinal contours are within normal limits.
Both lungs are clear. The visualized skeletal structures are
unremarkable.
IMPRESSION: No active cardiopulmonary disease.

## 2023-08-10 ENCOUNTER — Other Ambulatory Visit: Payer: Self-pay | Admitting: Gastroenterology

## 2023-08-10 ENCOUNTER — Other Ambulatory Visit: Payer: Self-pay | Admitting: Obstetrics & Gynecology

## 2023-08-26 ENCOUNTER — Other Ambulatory Visit: Payer: Self-pay | Admitting: Family Medicine

## 2023-09-05 ENCOUNTER — Other Ambulatory Visit: Payer: Self-pay | Admitting: Family Medicine

## 2023-10-07 ENCOUNTER — Ambulatory Visit: Payer: Self-pay | Admitting: *Deleted

## 2023-10-07 NOTE — Telephone Encounter (Signed)
 FYI Only or Action Required?: FYI only for provider.  Patient was last seen in primary care on 06/15/2023 by Jennifer Wilhelmena Lloyd Hilario, FNP.  Called Nurse Triage reporting Choking.  Symptoms began several days ago.  Interventions attempted: Nothing.  Symptoms are: unchanged.  Triage Disposition: See Physician Within 24 Hours  Patient/caregiver understands and will follow disposition?: No open appointments- UC advised     Reason for Disposition  [1] Symptoms of pill stuck in throat or esophagus (e.g., pain in throat or chest, FB sensation) AND [2] no relief after using Care Advice  Answer Assessment - Initial Assessment Questions 1. DESCRIPTION: Tell me more about this problem. Are you  having trouble swallowing liquids, solids, or both? Any trouble with swallowing saliva (spit)?     Patient feels like something is in the throat- sensation that there is something in the throat 2. SEVERITY: How bad is the swallowing difficulty?  (Scale 1-10; or mild, moderate, severe)     No difficulty swallowing or breathing 3. ONSET: When did the swallowing problems begin?      2 days ago 4. CAUSE: What do you think is causing the problem?  (e.g., dry mouth, food or pill stuck in throat, mouth pain, sore throat, progression of disease process such as dementia or Parkinson's disease).      Unsure cause- patient does not remember any food getting stuck 5. CHRONIC or RECURRENT: Is this a new problem for you?  If No, ask: How long have you had this problem? (e.g., days, weeks, months)      No- new problem- 2 days 6. OTHER SYMPTOMS: Do you have any other symptoms? (e.g., chest pain, difficulty breathing, mouth sores, sore throat, swollen tongue, chest pain)     No- cough caused by trying to clear- irritation in throat  Protocols used: Swallowing Difficulty-A-AH    Copied from CRM #[id number]. Topic: Clinical - Red Word Triage >> Oct 07, 2023 11:26 AM Avram MATSU wrote: Red Word that  prompted transfer to Nurse Triage: patient when she coughs it hurts her chest a little

## 2023-10-19 ENCOUNTER — Ambulatory Visit: Admitting: Family Medicine

## 2023-10-19 ENCOUNTER — Encounter: Payer: Self-pay | Admitting: Family Medicine

## 2023-10-19 VITALS — BP 137/86 | HR 76 | Ht 65.0 in | Wt 194.0 lb

## 2023-10-19 DIAGNOSIS — I10 Essential (primary) hypertension: Secondary | ICD-10-CM | POA: Diagnosis not present

## 2023-10-19 DIAGNOSIS — E119 Type 2 diabetes mellitus without complications: Secondary | ICD-10-CM

## 2023-10-19 DIAGNOSIS — Z7984 Long term (current) use of oral hypoglycemic drugs: Secondary | ICD-10-CM | POA: Diagnosis not present

## 2023-10-19 MED ORDER — OMEPRAZOLE 40 MG PO CPDR: 40.0000 mg | DELAYED_RELEASE_CAPSULE | Freq: Every morning | ORAL | 3 refills | Status: AC

## 2023-10-19 MED ORDER — SERTRALINE HCL 50 MG PO TABS: 50.0000 mg | ORAL_TABLET | Freq: Every day | ORAL | 3 refills | Status: DC

## 2023-10-19 NOTE — Assessment & Plan Note (Signed)
 Continue with amlodipine 5 mg once daily and benazepril 10 mg daily  Labs ordered. Discussed with  patient to monitor their blood pressure regularly and maintain a heart-healthy diet rich in fruits, vegetables, whole grains, and low-fat dairy, while reducing sodium intake to less than 2,300 mg per day. Regular physical activity, such as 30 minutes of moderate exercise most days of the week, will help lower blood pressure and improve overall cardiovascular health. Avoiding smoking, limiting alcohol consumption, and managing stress. Take  prescribed medication, & take it as directed and avoid skipping doses. Seek emergency care if your blood pressure is (over 180/100) or you experience chest pain, shortness of breath, or sudden vision changes.Patient verbalizes understanding regarding plan of care and all questions answered.

## 2023-10-19 NOTE — Patient Instructions (Signed)

## 2023-10-19 NOTE — Progress Notes (Signed)
 Established Patient Office Visit   Subjective  Patient ID: Jennifer Harvey, female    DOB: June 19, 1960  Age: 63 y.o. MRN: [id number]  Chief Complaint  Patient presents with   Diabetes    Four month follow up     She  has a past medical history of Breast cancer (HCC), Cancer (HCC), History of kidney cancer, Hypertension, Personal history of chemotherapy, Personal history of radiation therapy, and Sleep apnea.  HPI Patient presents to the clinic for chronic follow up. For the details of today's visit, please refer to assessment and plan.   Review of Systems  Constitutional:  Negative for chills and fever.  Respiratory:  Negative for shortness of breath.   Cardiovascular:  Negative for chest pain.  Gastrointestinal:  Negative for abdominal pain.  Genitourinary:  Negative for dysuria.  Neurological:  Negative for dizziness and headaches.      Objective:     BP 137/86   Pulse 76   Ht 5' 5 (1.651 m)   Wt 194 lb (88 kg)   LMP 04/29/2020 (Within Weeks) Comment: Bleeding  SpO2 93%   BMI 32.28 kg/m  BP Readings from Last 3 Encounters:  10/19/23 137/86  06/15/23 131/81  04/29/23 (!) 148/81      Physical Exam Vitals reviewed.  Constitutional:      General: She is not in acute distress.    Appearance: Normal appearance. She is not ill-appearing, toxic-appearing or diaphoretic.  HENT:     Head: Normocephalic.  Eyes:     General:        Right eye: No discharge.        Left eye: No discharge.     Conjunctiva/sclera: Conjunctivae normal.  Cardiovascular:     Rate and Rhythm: Normal rate.     Pulses: Normal pulses.     Heart sounds: Normal heart sounds.  Pulmonary:     Effort: Pulmonary effort is normal. No respiratory distress.     Breath sounds: Normal breath sounds.  Musculoskeletal:     Cervical back: Normal range of motion.  Skin:    General: Skin is warm and dry.     Capillary Refill: Capillary refill takes less than 2 seconds.  Neurological:      Mental Status: She is alert and oriented to person, place, and time.     Coordination: Coordination normal.     Gait: Gait normal.  Psychiatric:        Mood and Affect: Mood normal.        Behavior: Behavior normal.      No results found for any visits on 10/19/23.  The 10-year ASCVD risk score (Arnett DK, et al., 2019) is: 19.2%    Assessment & Plan:  Primary hypertension Assessment & Plan: Continue with amlodipine  5 mg once daily and benazepril  10 mg daily  Labs ordered. Discussed with  patient to monitor their blood pressure regularly and maintain a heart-healthy diet rich in fruits, vegetables, whole grains, and low-fat dairy, while reducing sodium intake to less than 2,300 mg per day. Regular physical activity, such as 30 minutes of moderate exercise most days of the week, will help lower blood pressure and improve overall cardiovascular health. Avoiding smoking, limiting alcohol consumption, and managing stress. Take  prescribed medication, & take it as directed and avoid skipping doses. Seek emergency care if your blood pressure is (over 180/100) or you experience chest pain, shortness of breath, or sudden vision changes.Patient verbalizes understanding regarding plan of care and all  questions answered.  Orders: -     Hemoglobin A1c -     BMP8+eGFR -     CBC with Differential/Platelet  Type 2 diabetes mellitus without complication, without long-term current use of insulin (HCC) Assessment & Plan: Last Hemoglobin A1c: 5.7 Labs: Ordered today, results pending; will follow up accordingly. The patient reports adhering to prescribed medications: Metformin  500 mg twice daily   Reviewed non-pharmacological interventions, including a balanced diet rich in lean proteins, healthy fats, whole grains, and high-fiber vegetables. Emphasized reducing refined sugars and processed carbohydrates, and incorporating more fruits, leafy greens, and legumes. Education: Patient was educated on  recognizing signs and symptoms of both hypoglycemia and hyperglycemia, and advised to seek emergency care if these symptoms occur. Follow-Up: Scheduled for follow-up in 3-4 months, or sooner if needed. Patient Understanding: The patient verbalized understanding of the care plan, and all questions were answered. Additional Care: Ophthalmology referral was placed. Foot exam results were within normal limits.   Orders: -     Microalbumin / creatinine urine ratio -     Ambulatory referral to Ophthalmology  Other orders -     Omeprazole ; Take 1 capsule (40 mg total) by mouth every morning.  Dispense: 30 capsule; Refill: 3 -     Sertraline  HCl; Take 1 tablet (50 mg total) by mouth daily.  Dispense: 30 tablet; Refill: 3    Return in about 4 months (around 02/18/2024), or if symptoms worsen or fail to improve, for type 2 diabetes, hypertension.   Hilario Kidd Wilhelmena Falter, FNP

## 2023-10-19 NOTE — Assessment & Plan Note (Signed)
 Last Hemoglobin A1c: 5.7 Labs: Ordered today, results pending; will follow up accordingly. The patient reports adhering to prescribed medications: Metformin  500 mg twice daily   Reviewed non-pharmacological interventions, including a balanced diet rich in lean proteins, healthy fats, whole grains, and high-fiber vegetables. Emphasized reducing refined sugars and processed carbohydrates, and incorporating more fruits, leafy greens, and legumes. Education: Patient was educated on recognizing signs and symptoms of both hypoglycemia and hyperglycemia, and advised to seek emergency care if these symptoms occur. Follow-Up: Scheduled for follow-up in 3-4 months, or sooner if needed. Patient Understanding: The patient verbalized understanding of the care plan, and all questions were answered. Additional Care: Ophthalmology referral was placed. Foot exam results were within normal limits.

## 2023-10-20 ENCOUNTER — Ambulatory Visit: Payer: Self-pay | Admitting: Family Medicine

## 2023-10-21 LAB — CBC WITH DIFFERENTIAL/PLATELET
Basophils Absolute: 0.1 x10E3/uL (ref 0.0–0.2)
Basos: 1 %
EOS (ABSOLUTE): 0.1 x10E3/uL (ref 0.0–0.4)
Eos: 3 %
Hematocrit: 41.5 % (ref 34.0–46.6)
Hemoglobin: 13.4 g/dL (ref 11.1–15.9)
Immature Grans (Abs): 0 x10E3/uL (ref 0.0–0.1)
Immature Granulocytes: 0 %
Lymphocytes Absolute: 2.1 x10E3/uL (ref 0.7–3.1)
Lymphs: 46 %
MCH: 29.3 pg (ref 26.6–33.0)
MCHC: 32.3 g/dL (ref 31.5–35.7)
MCV: 91 fL (ref 79–97)
Monocytes Absolute: 0.3 x10E3/uL (ref 0.1–0.9)
Monocytes: 8 %
Neutrophils Absolute: 1.9 x10E3/uL (ref 1.4–7.0)
Neutrophils: 42 %
Platelets: 272 x10E3/uL (ref 150–450)
RBC: 4.57 x10E6/uL (ref 3.77–5.28)
RDW: 14.5 % (ref 11.7–15.4)
WBC: 4.5 x10E3/uL (ref 3.4–10.8)

## 2023-10-21 LAB — MICROALBUMIN / CREATININE URINE RATIO
Creatinine, Urine: 138.5 mg/dL
Microalb/Creat Ratio: 147 mg/g{creat} — AB (ref 0–29)
Microalbumin, Urine: 203.1 ug/mL

## 2023-10-21 LAB — BMP8+EGFR
BUN/Creatinine Ratio: 12 (ref 12–28)
BUN: 8 mg/dL (ref 8–27)
CO2: 24 mmol/L (ref 20–29)
Calcium: 10 mg/dL (ref 8.7–10.3)
Chloride: 102 mmol/L (ref 96–106)
Creatinine, Ser: 0.67 mg/dL (ref 0.57–1.00)
Glucose: 95 mg/dL (ref 70–99)
Potassium: 4.3 mmol/L (ref 3.5–5.2)
Sodium: 144 mmol/L (ref 134–144)
eGFR: 100 mL/min/1.73 (ref >59)

## 2023-10-21 LAB — HEMOGLOBIN A1C
Est. average glucose Bld gHb Est-mCnc: 120 mg/dL
Hgb A1c MFr Bld: 5.8 % — ABNORMAL HIGH (ref 4.8–5.6)

## 2023-10-26 ENCOUNTER — Telehealth: Payer: Self-pay

## 2023-10-26 NOTE — Telephone Encounter (Signed)
 Copied from CRM [id number]. Topic: Clinical - Lab/Test Results >> Oct 26, 2023  9:15 AM [name] wrote: Reason for CRM: patient called in requesting lab results and would like a nurse to call her back    Patient was made aware their call will be returned in 1 business day.

## 2023-10-26 NOTE — Telephone Encounter (Signed)
Awaiting results to be reviewed by provider.

## 2023-10-27 ENCOUNTER — Telehealth: Payer: Self-pay

## 2023-10-27 NOTE — Telephone Encounter (Signed)
 Copied from CRM [id number]. Topic: Clinical - Lab/Test Results >> Oct 27, 2023 12:17 PM Montie POUR wrote: Reason for CRM:  I read her lab results dated 10/20/23. She will adjust metformin  to 500 mg once daily. She had no questions.

## 2023-11-02 ENCOUNTER — Ambulatory Visit (INDEPENDENT_AMBULATORY_CARE_PROVIDER_SITE_OTHER): Admitting: Adult Health

## 2023-11-02 ENCOUNTER — Encounter: Payer: Self-pay | Admitting: Adult Health

## 2023-11-02 VITALS — BP 133/84 | HR 73 | Ht 65.0 in | Wt 194.5 lb

## 2023-11-02 DIAGNOSIS — R1032 Left lower quadrant pain: Secondary | ICD-10-CM | POA: Diagnosis not present

## 2023-11-02 DIAGNOSIS — R350 Frequency of micturition: Secondary | ICD-10-CM

## 2023-11-02 DIAGNOSIS — G8929 Other chronic pain: Secondary | ICD-10-CM | POA: Insufficient documentation

## 2023-11-02 LAB — POCT URINALYSIS DIPSTICK
Glucose, UA: NEGATIVE
Ketones, UA: NEGATIVE
Nitrite, UA: NEGATIVE
Protein, UA: NEGATIVE

## 2023-11-02 NOTE — Progress Notes (Signed)
 Subjective:     Patient ID: Jennifer Harvey, female   DOB: [birth year], 63 y.o.   MRN: [id number]  HPI Jennifer Harvey is a 63 year old black female, married, PM in complaining of pain in left groin with walking, can be sharp at times. It started last year when on a cruise. She says it may hurt if smokes THC at times, too.  She was on gabapentin  and stopped that few weeks ago. WBC was 4.5 10/19/23.     Component Value Date/Time   DIAGPAP  02/17/2023 1455    - Negative for intraepithelial lesion or malignancy (NILM)   DIAGPAP  08/04/2020 0932    - Negative for intraepithelial lesion or malignancy (NILM)   HPVHIGH Negative 02/17/2023 1455   HPVHIGH Negative 08/04/2020 0932   ADEQPAP  02/17/2023 1455    Satisfactory for evaluation; transformation zone component PRESENT.   ADEQPAP  08/04/2020 0932    Satisfactory for evaluation. The presence or absence of an   ADEQPAP  08/04/2020 0932    endocervical/transformation zone component cannot be determined because   ADEQPAP of atrophy. 08/04/2020 0932   PCP is I Polanco  Review of Systems + pain in left groin with walking, can be sharp at times. It started last year when on a cruise. She says it may hurt if smokes THC at times, too.    +urinary frequency esp at night Denies any pain with sex or vaginal bleeding Reviewed past medical,surgical, social and family history. Reviewed medications and allergies.  Objective:   Physical Exam BP 133/84 (BP Location: Left Arm, Patient Position: Sitting, Cuff Size: Normal)   Pulse 73   Ht 5' 5 (1.651 m)   Wt 194 lb 8 oz (88.2 kg)   LMP 04/29/2020 (Within Weeks) Comment: Bleeding  BMI 32.37 kg/m  Urine dipstick trace leuks, and blood.    Skin warm and dry.Pelvic: external genitalia is normal in appearance no lesions, she is tender in left groin, no lymph nodes felt, but in that area is tender, vagina: pale,urethra has no lesions or masses noted, cervix:smooth, uterus: normal size, shape and contour, non  tender, no masses felt, adnexa: no masses or tenderness noted. Bladder is non tender and no masses felt.  Fall risk is low  Upstream - 11/02/23 1516       Pregnancy Intention Screening   Does the patient want to become pregnant in the next year? N/A    Does the patient's partner want to become pregnant in the next year? N/A    Would the patient like to discuss contraceptive options today? N/A      Contraception Wrap Up   Current Method No Method - Other Reason   PM   End Method No Method - Other Reason   PM   Contraception Counseling Provided No         Examination chaperoned by Clarita Salt LPN   Assessment:     1. Urinary frequency UA C&S sent to rule out UTI  - POCT Urinalysis Dipstick - Urine Culture - Urinalysis, Routine w reflex microscopic  2. Chronic groin pain, left (Primary)  +pain in left groin with walking, can be sharp at times. It started last year when on a cruise. She says it may hurt if smokes THC at times, too.  Do not feel this is GYN related, will refer to [facility] [place]  - Ambulatory referral to Orthopedic Surgery     Plan:     Follow up prn

## 2023-11-03 DIAGNOSIS — R350 Frequency of micturition: Secondary | ICD-10-CM | POA: Diagnosis not present

## 2023-11-04 LAB — URINALYSIS, ROUTINE W REFLEX MICROSCOPIC
Bilirubin, UA: NEGATIVE
Glucose, UA: NEGATIVE
Ketones, UA: NEGATIVE
Leukocytes,UA: NEGATIVE
Nitrite, UA: NEGATIVE
RBC, UA: NEGATIVE
Specific Gravity, UA: 1.015 (ref 1.005–1.030)
Urobilinogen, Ur: 0.2 mg/dL (ref 0.2–1.0)
pH, UA: 8 — ABNORMAL HIGH (ref 5.0–7.5)

## 2023-11-05 LAB — URINE CULTURE

## 2023-11-07 ENCOUNTER — Ambulatory Visit: Payer: Self-pay | Admitting: Adult Health

## 2023-11-17 ENCOUNTER — Ambulatory Visit: Admitting: Orthopaedic Surgery

## 2024-01-28 ENCOUNTER — Emergency Department (HOSPITAL_COMMUNITY)

## 2024-01-28 ENCOUNTER — Encounter (HOSPITAL_COMMUNITY): Payer: Self-pay

## 2024-01-28 ENCOUNTER — Emergency Department (HOSPITAL_COMMUNITY)
Admission: EM | Admit: 2024-01-28 | Discharge: 2024-01-28 | Disposition: A | Discharge status: Home / Self Care | Attending: Emergency Medicine | Admitting: Emergency Medicine

## 2024-01-28 DIAGNOSIS — M25552 Pain in left hip: Secondary | ICD-10-CM | POA: Insufficient documentation

## 2024-01-28 DIAGNOSIS — R1022 Pelvic and perineal pain left side: Secondary | ICD-10-CM | POA: Insufficient documentation

## 2024-01-28 DIAGNOSIS — R1032 Left lower quadrant pain: Secondary | ICD-10-CM | POA: Diagnosis not present

## 2024-01-28 DIAGNOSIS — M545 Low back pain, unspecified: Secondary | ICD-10-CM | POA: Insufficient documentation

## 2024-01-28 DIAGNOSIS — R102 Pelvic and perineal pain unspecified side: Secondary | ICD-10-CM

## 2024-01-28 LAB — URINALYSIS, ROUTINE W REFLEX MICROSCOPIC
Bilirubin Urine: NEGATIVE
Glucose, UA: NEGATIVE mg/dL
Ketones, ur: NEGATIVE mg/dL
Leukocytes,Ua: NEGATIVE
Nitrite: NEGATIVE
Protein, ur: NEGATIVE mg/dL
Specific Gravity, Urine: 1.013 (ref 1.005–1.030)
pH: 6 (ref 5.0–8.0)

## 2024-01-28 LAB — RAPID HIV SCREEN (HIV 1/2 AB+AG)
HIV 1/2 Antibodies: NONREACTIVE
HIV-1 P24 Antigen - HIV24: NONREACTIVE

## 2024-01-28 LAB — WET PREP, GENITAL
Clue Cells Wet Prep HPF POC: NONE SEEN
Sperm: NONE SEEN
Trich, Wet Prep: NONE SEEN
WBC, Wet Prep HPF POC: <10 (ref <10)
Yeast Wet Prep HPF POC: NONE SEEN

## 2024-01-28 MED ORDER — ACETAMINOPHEN 325 MG PO TABS
650.0000 mg | ORAL_TABLET | Freq: Once | ORAL | Status: AC
  Administered 2024-01-28: 650 mg via ORAL
  Filled 2024-01-28: qty 2

## 2024-01-28 MED ORDER — IBUPROFEN 800 MG PO TABS: 800.0000 mg | ORAL_TABLET | Freq: Once | ORAL | Status: DC

## 2024-01-28 MED ORDER — MELOXICAM 7.5 MG PO TABS: 7.5000 mg | ORAL_TABLET | Freq: Every day | ORAL | 2 refills | Status: DC

## 2024-01-28 NOTE — ED Triage Notes (Signed)
 Pt comes in for left side vaginal pain. Pt states in only hurt in the left side groin area and the labia. Pt denies any bleeding but has a white discharge. Pain has been there for 2 months. Pt has been taking gabapentin  for pain but now no longer effective. A&Ox4.

## 2024-01-28 NOTE — Discharge Instructions (Addendum)
 Follow-up with your primary care physician for recheck.  You have test that are pending that should return in the next 24 to 36 hours.  You may follow these on your MyChart.  Try taking Tylenol  or ibuprofen  for pelvic discomfort.  The pain that you are experiencing in your low back around to the groin may be caused by your hip or pelvic bones.  Schedule to see the orthopedist for evaluation.  Return if any problems

## 2024-01-28 NOTE — ED Notes (Signed)
 Upon assessment of the pts GI/GU system the pt endorses having pain in her left labia only. Pt endorses it gets worse when she smokes weed and that her legs swell bilaterally as well. Pt endorses the pain is a 6/10 and that is it throbbing, stinging and always there.

## 2024-01-28 NOTE — ED Provider Notes (Signed)
 Jennifer Harvey Provider Note   CSN: [id number] Arrival date & time: 01/28/24  8169     Patient presents with: Vaginal Pain   Jennifer Harvey is a 63 y.o. female.   Patient complains of pain in the left groin area that radiates from her low back around to her groin and to the external vaginal area.  Patient has seen her gynecologist who evaluated her and did not find anything.  Patient is requesting testing for STD.  Patient reports that she has noticed a vaginal odor.  She has not had any fever or chills no nausea no vomiting.  Patient has not had any abdominal pain.  Patient states that she has pain when she is sleeping at night she has pain with moving.  Patient reports the pain is in her low back and radiates around.  The history is provided by the patient. No language interpreter was used.  Vaginal Pain       Prior to Admission medications   Medication Sig Start Date End Date Taking? Authorizing Provider  albuterol  (PROVENTIL ) (2.5 MG/3ML) 0.083% nebulizer solution Take 3 mLs (2.5 mg total) by nebulization every 6 (six) hours as needed for wheezing or shortness of breath. 02/08/23   Elnor Jayson LABOR, DO  albuterol  (VENTOLIN  HFA) 108 (90 Base) MCG/ACT inhaler Inhale 1-2 puffs into the lungs every 6 (six) hours as needed for wheezing or shortness of breath. 02/08/23   Elnor Jayson LABOR, DO  amLODipine  (NORVASC ) 5 MG tablet TAKE 1 TABLET(5 MG) BY MOUTH DAILY 09/05/23   Del Orbe Polanco, Iliana, FNP  benazepril  (LOTENSIN ) 10 MG tablet TAKE 1 TABLET(10 MG) BY MOUTH DAILY 08/26/23   Del Wilhelmena Falter, Hilario, FNP  cyanocobalamin  (VITAMIN B12) 1000 MCG tablet Take 1,000 mcg by mouth daily.    [provider]  ergocalciferol  (VITAMIN D2) 1.25 MG (50000 UT) capsule Take 1 capsule (50,000 Units total) by mouth once a week. 02/24/23   Rogers Hai, MD  fluticasone -salmeterol (ADVAIR) 100-50 MCG/ACT AEPB Inhale 1 puff into the lungs 2  (two) times daily. 03/01/23   Del Orbe Polanco, Iliana, FNP  gabapentin  (NEURONTIN ) 600 MG tablet Take 1 tablet (600 mg total) by mouth 2 (two) times daily as needed. 06/15/23   Del Orbe Polanco, Iliana, FNP  metFORMIN  (GLUCOPHAGE ) 500 MG tablet Take 1 tablet (500 mg total) by mouth 2 (two) times daily with a meal. 03/02/23   Del Wilhelmena Falter, Hilario, FNP  methocarbamol  (ROBAXIN ) 500 MG tablet Take 1 tablet (500 mg total) by mouth 2 (two) times daily as needed for muscle spasms. 09/19/22   Cleotilde Rogue, MD  montelukast  (SINGULAIR ) 10 MG tablet Take 1 tablet (10 mg total) by mouth at bedtime. 03/01/23   Del Orbe Polanco, Iliana, FNP  Omega-3 Fatty Acids (FISH OIL PO) Take by mouth.    [provider]  omeprazole  (PRILOSEC) 40 MG capsule Take 1 capsule (40 mg total) by mouth every morning. 10/19/23   Del Wilhelmena Falter Hilario, FNP  rosuvastatin  (CRESTOR ) 5 MG tablet Take 1 tablet (5 mg total) by mouth daily. 06/16/23   Del Orbe Polanco, Iliana, FNP  Thiamine HCl (VITAMIN B-1) 250 MG tablet Take 250 mg by mouth daily.    [provider]  venlafaxine  (EFFEXOR ) 75 MG tablet Take by mouth daily. 08/27/23   [provider]    Allergies: Patient has no known allergies.    Review of Systems  Genitourinary:  Positive for vaginal pain.  All other systems reviewed and are negative.   Updated Vital Signs BP 138/87   Pulse (!) 58   Temp 99.2 F (37.3 C) (Oral)   Resp 14   Ht 5' 6 (1.676 m)   Wt 86.6 kg   LMP 04/29/2020 (Within Weeks) Comment: Bleeding  SpO2 95%   BMI 30.83 kg/m   Physical Exam Vitals and nursing note reviewed.  Constitutional:      Appearance: She is well-developed.  HENT:     Head: Normocephalic.  Cardiovascular:     Rate and Rhythm: Normal rate.  Pulmonary:     Effort: Pulmonary effort is normal.  Abdominal:     General: There is no distension.  Genitourinary:    General: Normal vulva.     Vagina: No vaginal discharge.     Comments: Normal pelvic  exam cervix nontender, adnexa nontender, pain not reproducible on pelvic exam Musculoskeletal:        General: Normal range of motion.     Cervical back: Normal range of motion.     Comments: Pain with range of motion testing of low back.  Pain with movement of left hip.  Skin:    General: Skin is warm.  Neurological:     General: No focal deficit present.     Mental Status: She is alert and oriented to person, place, and time.     (all labs ordered are listed, but only abnormal results are displayed) Labs Reviewed  URINALYSIS, ROUTINE W REFLEX MICROSCOPIC - Abnormal; Notable for the following components:      Result Value   Hgb urine dipstick SMALL (*)    Bacteria, UA RARE (*)    All other components within normal limits  WET PREP, GENITAL  RAPID HIV SCREEN (HIV 1/2 AB+AG)  RPR  GC/CHLAMYDIA PROBE AMP ([place]) NOT AT [facility]    EKG: None  Radiology: DG Pelvis 1-2 Views Result Date: 01/28/2024 EXAM: 1 or 2 VIEW(S) XRAY OF THE PELVIS 01/28/2024 08:34:21 PM COMPARISON: None available. CLINICAL HISTORY: pain pain FINDINGS: BONES AND JOINTS: No acute fracture. No focal osseous lesion. No joint dislocation. SOFT TISSUES: Round calcifications in the pelvis are likely phleboliths. IMPRESSION: 1. No acute findings. Electronically signed by: Dorethia Molt MD 01/28/2024 08:38 PM EST RP Workstation: [id number]     Procedures   Medications Ordered in the ED - No data to display                                  Medical Decision Making Patient reports she has been having pain in the left groin area for several months.  Patient is requesting STD testing.  Amount and/or Complexity of Data Reviewed Labs: ordered. Decision-making details documented in ED Course.    Details: Labs ordered reviewed and interpreted UA is negative wet prep is negative.  GC chlamydia RPR and HIV are pending Radiology: ordered.    Details: Pelvic x-ray shows no acute findings  Risk Risk Details:  Patient's discomfort seems to be musculoskeletal.  Patient is advised to schedule follow-up with orthopedist.  She is advised of pending test she should follow-up with her primary care physician for recheck Tylenol  or ibuprofen  for discomfort.        Final diagnoses:  Pelvic pain    ED Discharge Orders     None      An After Visit Summary was printed and given to the patient.  Skilynn Durney K, PA-C 01/28/24 2237    Towana Ozell BROCKS, MD 01/29/24 [phone]

## 2024-01-30 LAB — RPR
RPR Ser Ql: REACTIVE — AB
RPR Titer: 1:1 {titer}

## 2024-01-30 LAB — GC/CHLAMYDIA PROBE AMP (~~LOC~~) NOT AT ARMC
Chlamydia: NEGATIVE
Comment: NEGATIVE
Comment: NORMAL
Neisseria Gonorrhea: NEGATIVE

## 2024-01-31 LAB — T.PALLIDUM AB, TOTAL: T Pallidum Abs: REACTIVE — AB

## 2024-02-02 ENCOUNTER — Other Ambulatory Visit: Payer: Self-pay | Admitting: Family Medicine

## 2024-02-08 ENCOUNTER — Other Ambulatory Visit: Payer: Self-pay | Admitting: *Deleted

## 2024-02-08 DIAGNOSIS — E669 Obesity, unspecified: Secondary | ICD-10-CM

## 2024-02-08 DIAGNOSIS — C50211 Malignant neoplasm of upper-inner quadrant of right female breast: Secondary | ICD-10-CM

## 2024-02-08 DIAGNOSIS — Z6830 Body mass index (BMI) 30.0-30.9, adult: Secondary | ICD-10-CM

## 2024-02-13 ENCOUNTER — Ambulatory Visit: Admitting: Orthopedic Surgery

## 2024-02-13 ENCOUNTER — Other Ambulatory Visit: Payer: Self-pay

## 2024-02-13 VITALS — BP 146/81 | Ht 66.0 in | Wt 191.0 lb

## 2024-02-13 DIAGNOSIS — M1612 Unilateral primary osteoarthritis, left hip: Secondary | ICD-10-CM

## 2024-02-13 DIAGNOSIS — M76892 Other specified enthesopathies of left lower limb, excluding foot: Secondary | ICD-10-CM | POA: Diagnosis not present

## 2024-02-13 DIAGNOSIS — M1611 Unilateral primary osteoarthritis, right hip: Secondary | ICD-10-CM

## 2024-02-13 DIAGNOSIS — M25552 Pain in left hip: Secondary | ICD-10-CM

## 2024-02-13 MED ORDER — METHOCARBAMOL 750 MG PO TABS: 750.0000 mg | ORAL_TABLET | Freq: Four times a day (QID) | ORAL | 2 refills | Status: AC

## 2024-02-13 NOTE — Progress Notes (Signed)
    Intake history:  Chief Complaint  Patient presents with   Hip Pain     BP (!) 146/81 Comment: 01/28/24  Ht 5' 6 (1.676 m)   Wt 191 lb (86.6 kg)   LMP 04/29/2020 (Within Weeks) Comment: Bleeding  BMI 30.83 kg/m  Body mass index is 30.83 kg/m.  Pharmacy? [address]___  WHAT ARE WE SEEING YOU FOR TODAY?   Left hip / groin goes to lateral hip denies back pain and buttock pain  How long has this bothered you? (DOI?DOS?WS?)  A little over a year  Was there an injury? No  Anticoag.  No   Any ALLERGIES ________No Known Allergies ______________________________________   Treatment:  Have you taken:  Tylenol  No  Advil  No  Had PT No  Had injection No  Other  ________________Robaxin helps _________

## 2024-02-13 NOTE — Progress Notes (Signed)
 Office Visit Note ER follow-up   Patient: Jennifer Harvey           Date of Birth: January 08, 1961           MRN: [id number] Visit Date: 02/13/2024 Requested by: Terry Wilhelmena Lloyd Hilario, FNP [phone] [address] [place],  [place] [address] PCP: Terry Wilhelmena Lloyd Hilario, FNP   Assessment & Plan:   Encounter Diagnoses  Name Primary?   Pain in left hip    Tendonitis of left hip flexor Yes   Arthritis of right hip     Meds ordered this encounter  Medications   methocarbamol  (ROBAXIN ) 750 MG tablet    Sig: Take 1 tablet (750 mg total) by mouth 4 (four) times daily.    Dispense:  60 tablet    Refill:  2    The patient has some arthritis in her right hip it is mild grade 1.  She has tendinitis in the left hip flexor.  She says that the Robaxin  is helping her significantly and we will continue that.  The arthritis of the hip does not appear to be symptomatic   Subjective: Chief Complaint  Patient presents with   Hip Pain    left    HPI: Correction of the ER record.  63 year old female with groin pain no pain in the back no radiation from the back into the hip all of the pain is in the groin area and in the anterior hip.  She has difficulty when she is standing on it but it is intermittent and not constant she has no trouble sitting climbing stairs or driving in the car no trauma.              ROS: Nothing to add   Images personally read and my interpretation : Outside imaging of the pelvis no specific abnormalities  Images were done of the hip and she has signs of FAI they are mild they do not appear to be causing the pain she is having now  Visit Diagnoses:  1. Tendonitis of left hip flexor   2. Pain in left hip   3. Arthritis of right hip      Follow-Up Instructions: Return if symptoms worsen or fail to improve.    Objective: Vital Signs: BP (!) 146/81 Comment: 01/28/24  Ht 5' 6 (1.676 m)   Wt 191 lb (86.6 kg)   LMP 04/29/2020 (Within Weeks) Comment:  Bleeding  BMI 30.83 kg/m   Physical Exam She is a normally appearing person normal development grooming and hygiene she is awake alert and oriented x 3 mood and affect are normal  Ortho Exam  She walks with no limp or assistive device  She has normal range of motion of both hips equal leg lengths and the only pain we could reproduce was with flexion external rotation of the hip and tenderness over the hip flexor mainly the sartorius   Specialty Comments:  No specialty comments available.  Imaging: No results found.  See narrative above   PMFS History: Patient Active Problem List   Diagnosis Date Noted   Chronic groin pain, left 11/02/2023   Urinary frequency 11/02/2023   Type 2 diabetes mellitus (HCC) 06/15/2023   Airway hyperreactivity 03/01/2023   GERD (gastroesophageal reflux disease) 11/02/2022   Sensorineural hearing loss (SNHL) of both ears 10/15/2022   Tinnitus 10/15/2022   Bloating 07/20/2022   Diarrhea 07/20/2022   Abdominal pain 07/20/2022   Fatty liver 07/20/2022   Palpitations  08/17/2021   Primary hypertension 08/17/2021   Chronic bronchitis (HCC) 06/11/2020   DDD (degenerative disc disease), cervical 06/11/2020   Degeneration of thoracolumbar intervertebral disc 06/11/2020   Prediabetes 06/11/2020   Renal cyst 06/11/2020   Tubular adenoma of colon 06/11/2020   Obesity 06/11/2020   History of malignant neoplasm of kidney 06/11/2020   Personal history of malignant neoplasm of breast 06/11/2020   Malignant neoplasm of upper-inner quadrant of breast in female, estrogen receptor positive (HCC) 08/19/2017   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   Past Medical History:  Diagnosis Date   Breast cancer (HCC)    Cancer (HCC)    renal cell carcinoma 2007 s/p partial right nephrectomy    Diabetes mellitus without complication (HCC)    History of kidney cancer    Hypertension    Personal history of chemotherapy    Personal history of radiation  therapy    Sleep apnea     Family History  Problem Relation Age of Onset   Other Father        auto accident   Cancer Maternal Grandmother        stomach   Cancer Maternal Grandfather        kidney cancer   Colon cancer Neg Hx     Past Surgical History:  Procedure Laterality Date   BIOPSY  09/21/2022   Procedure: BIOPSY;  Surgeon: Cindie Carlin POUR, DO;  Location: [facility];  Service: Endoscopy;;   BREAST LUMPECTOMY Right 2019   BREAST SURGERY     CESAREAN SECTION  1994   COLONOSCOPY     COLONOSCOPY WITH PROPOFOL  N/A 09/21/2022   Procedure: COLONOSCOPY WITH PROPOFOL ;  Surgeon: Cindie Carlin POUR, DO;  Location: [facility];  Service: Endoscopy;  Laterality: N/A;  random colon biopsies, 145pm, asa 2   HYSTEROSCOPY WITH D & C N/A 03/12/2020   Procedure: Hysteroscopy Uterine Curettage;  Surgeon: Jayne Vonn DEL, MD;  Location: [facility];  Service: Gynecology;  Laterality: N/A;   HYSTEROSCOPY WITH D & C N/A 11/11/2020   Procedure: DILATATION AND CURETTAGE /HYSTEROSCOPY;  Surgeon: Ozan, Jennifer, DO;  Location: [facility];  Service: Gynecology;  Laterality: N/A;   partial right nephrectomy  2007   POLYPECTOMY N/A 11/11/2020   Procedure: Myosure POLYPECTOMY;  Surgeon: Marilynn Nest, DO;  Location: [facility];  Service: Gynecology;  Laterality: N/A;   POLYPECTOMY  09/21/2022   Procedure: POLYPECTOMY;  Surgeon: Cindie Carlin POUR, DO;  Location: [facility];  Service: Endoscopy;;   Social History   Occupational History   Occupation: makes cookies    Employer: [employer]  Tobacco Use   Smoking status: Never   Smokeless tobacco: Never  Vaping Use   Vaping status: Never Used  Substance and Sexual Activity   Alcohol use: Yes    Alcohol/week: 14.0 standard drinks of alcohol    Types: 14 Cans of beer per week   Drug use: Yes    Types: Marijuana    Comment: every day   Sexual activity: Yes    Birth control/protection: Post-menopausal

## 2024-02-23 ENCOUNTER — Other Ambulatory Visit: Payer: BC Managed Care – PPO

## 2024-02-24 DIAGNOSIS — R1084 Generalized abdominal pain: Secondary | ICD-10-CM | POA: Diagnosis not present

## 2024-02-25 NOTE — Progress Notes (Unsigned)
 [facility] [address], [place] [address]   CLINIC:  Medical Oncology/Hematology  PCP:  Terry Wilhelmena Lloyd Hilario, FNP [phone] [address] / [place] [address] [phone]   REASON FOR VISIT:  Follow-up for stage I right breast IDC (ER/PR positive, HER2 negative  PRIOR THERAPY: - Right partial nephrectomy in 2007 for kidney cancer - Right breast lumpectomy in June 2019 - Radiation therapy done in [place] - Tamoxifen  (December 2019 through December 2024)  CURRENT THERAPY: Surveillance  BRIEF ONCOLOGIC HISTORY:   Oncology History  Malignant neoplasm of upper-inner quadrant of breast in female, estrogen receptor positive (HCC)  06/07/2017 Mammogram   BILAT SCREENING MAMMOGRAM  There is an irregular focal asymmetry or spiculated mass in the upper inner right breast middle third, located 7-8 cm from the nipple with a single punctate calcification posteriorly.  Additionally there is a small irregular mass or focal asymmetry more anteriorly in the inner central right breast, 5 cm from the nipple.  Between these 2 masses there is a stable grouping of calcifications.  Left breast is negative   06/20/2017 Mammogram   Right unilateral diagnostic mammogram: Redemonstrated irregular masslike focal asymmetry with architectural distortion in the upper inner breast with an associated punctate calcification measuring 1.1 cm.  Anterior to this is a more subtle area of focal architectural distortion.  Overall the area spans approximately 4 cm   06/20/2017 Breast US    Targeted ultrasound of the right breast reveals at 2 o'clock position 3-4 cm from the nipple there is a subtle irregular hypoechoic area with shadowing measuring 8 mm.  Incidentally noted at 3 o'clock position 6 cm from the nipple is an oval well-circumscribed hypoechoic mass measuring 1.6 x 0.8 x 1.1 cm   06/30/2017 Initial Biopsy   US  needle core biopsy:  Diagnosis: Fibrotic tissue with increased  adenosis, no tumor seen. Immunostains returned.  All glandular elements in the core biopsies are positive for CK 5/6, high molecular weight cytokeratin and E-Cadherin.  There is no focal increase in proliferation with a Ki-67 stain.  Both p63 and smooth muscle myosin exhibit intact basilar and myoepithelial layers surrounding all glandular elements in the core biopsy sections.  These findings support a benign diagnosis.   07/12/2017 Mammogram   Diagnostic mammogram, post biopsy clip imaging: Biopsy clip is approximately 1.3 cm medial to the suspicious mass which was biopsied on US . There is a persistent asymmetry at the site of concern in the medial right breast middle depth. Further biopsy was recommended    08/03/2017 Pathology Results   Diagnosis 1. Breast, right, needle core biopsy, UIQ - INVASIVE MAMMARY CARCINOMA. - MAMMARY CARCINOMA IN SITU. - SEE MICROSCOPIC DESCRIPTION. 2. Breast, right, needle core biopsy, UIQ - INVASIVE MAMMARY CARCINOMA. - MAMMARY CARCINOMA IN SITU. - SEE MICROSCOPIC DESCRIPTION.  Microscopic Comment 1. There is invasive mammary carcinoma which has features suggestive of invasive lobular carcinoma. There is also a separate component which may represent an invasive ductal component. E-Cadherin immunohistochemistry will be performed as well as prognostic profile. 2. E-Cadherin and breast prognostic profile will be performed.  ADDENDUM: 1,2. Immunohistochemistry for E-Cadherin shows an invasive and in situ component that is E-Cadherin negative consistent with lobular carcinoma. There is also a separate morphologic component which is strongly E-Cadherin positive consistent with invasive and in situ ductal carcinoma. Basal cell markers for p63, calponin and smooth muscle myosin are negative in the invasive component supporting the diagnosis. (JDP:kh 08-04-17)  1. PROGNOSTIC INDICATORS  Results: IMMUNOHISTOCHEMICAL AND MORPHOMETRIC ANALYSIS PERFORMED MANUALLY Estrogen  Receptor: 100%, POSITIVE, STRONG STAINING INTENSITY Progesterone Receptor: 10%, POSITIVE, STRONG STAINING INTENSITY Proliferation Marker Ki67: 20%  1. FLUORESCENCE IN-SITU HYBRIDIZATION Results: HER2 - NEGATIVE RATIO OF HER2/CEP17 SIGNALS 1.41 AVERAGE HER2 COPY NUMBER PER CELL 1.90  2. PROGNOSTIC INDICATORS Results: IMMUNOHISTOCHEMICAL AND MORPHOMETRIC ANALYSIS PERFORMED MANUALLY Estrogen Receptor: 100%, POSITIVE, STRONG STAINING INTENSITY Progesterone Receptor: 20%, POSITIVE, STRONG STAINING INTENSITY Proliferation Marker Ki67: 20%  2. FLUORESCENCE IN-SITU HYBRIDIZATION Results: HER2 - NEGATIVE RATIO OF HER2/CEP17 SIGNALS 1.25 AVERAGE HER2 COPY NUMBER PER CELL 2.00   08/19/2017 Initial Diagnosis   Malignant neoplasm of upper-inner quadrant of breast in female, estrogen receptor positive (HCC)   08/19/2017 Cancer Staging   Staging form: Breast, AJCC 8th Edition - Clinical stage from 08/19/2017: cT1c, cN0, cM0, ER+, PR+, HER2- - Signed by Burton, Lacie K, NP on 08/19/2017     CANCER STAGING: Cancer Staging  Malignant neoplasm of upper-inner quadrant of breast in female, estrogen receptor positive (HCC) Staging form: Breast, AJCC 8th Edition - Clinical stage from 08/19/2017: cT1c, cN0, cM0, ER+, PR+, HER2- - Signed by Burton, Lacie K, NP on 08/19/2017 - Pathologic: No stage assigned - Unsigned   INTERVAL HISTORY:   [name], a 63 y.o. female, returns for routine follow-up of her stage I right-sided breast cancer.  [name] was last seen on 02/24/2023 by Dr. Rogers.   In the interim since last visit, she has not had any surgeries, hospitalizations, or changes in baseline health status.  *** At today's visit, she reports feeling ***.  She  reports ***% energy and ***% appetite.   ***She  is maintaining stable weight at this time.  ***No new onset breast lumps or axillary lymphadenopathy. ***Denies any new aches or pains, headaches, or abdominal pain. ***She  completed course of tamoxifen  at the end of December 2024. ***Her hot flashes have *** after stopping tamoxifen . ***Had been taking Effexor  and Veozah ?  *** ***Vitamin D ?  ***  ASSESSMENT & PLAN:  1.  Stage I (T1CN0) right breast IDC, ER 3+, PR 3+, HER2 negative: - 08/03/2017: Right breast UIQ biopsy: Invasive lobular carcinoma, ER 100%, PR 20%, Ki-67 20%, HER2 negative. - Lumpectomy was done in [place] .  1.7 cm PT1CN0 infiltrating ductal carcinoma ER 3+, PR 3+, HER2 negative. - Status post XRT in [place]. - Completed 5-year course of tamoxifen  (December 2019 through December 2024).  BCI testing did not show any benefit in extended antiestrogen therapy. - Most recent mammogram (02/24/2023): BI-RADS Category 1, negative - PHYSICAL EXAM (***): *** - Most recent labs (***): *** - No red flag symptoms per history today *** - PLAN: At this time, patient is stable for discharge to PCP.  Recommend that she receive annual breast exam and mammogram via PCP.*** - Since she has not yet received this years mammogram, we will schedule this to be done within the next month.  Otherwise, PCP should follow going forward.  ***  2.   Vasomotor symptoms: - Significant hot flashes while on tamoxifen , controlled with Effexor  75 mg daily and Veozah .  *** - Tamoxifen  completed in December 2024.  *** Current symptoms *** - PLAN: ***   3.  Bone health: - Last DEXA scan on 02/03/2022 with T-score -0.3. - Severe vitamin D  deficiency noted on 02/17/2023 (vitamin D  was 9.45). - She is currently taking vitamin D  2000 units daily *** - Most recent labs *** - PLAN: ***   4.  Right kidney cancer: -  Diagnosed in 2007, status post right partial nephrectomy. - CTAP on 06/22/2022: Bilateral renal cysts including hemorrhagic cyst in the medial right kidney.  Mild diffuse hepatic steatosis.  5.  Social/family history: - She lives at home with her husband.  She sits with elderly people, works for [employer]. - Maternal grandmother had stomach cancer.  Maternal uncle had lung cancer.   PLAN SUMMARY: >> *** >> *** >> ***   REVIEW OF SYSTEMS: ***  Review of Systems - Oncology  PHYSICAL EXAM:   Performance status (ECOG): {CHL ONC ED:8845999799} *** There were no vitals filed for this visit. Wt Readings from Last 3 Encounters:  02/13/24 191 lb (86.6 kg)  01/28/24 191 lb (86.6 kg)  11/02/23 194 lb 8 oz (88.2 kg)   Physical Exam   PAST MEDICAL/SURGICAL HISTORY:  Past Medical History:  Diagnosis Date   Breast cancer (HCC)    Cancer (HCC)    renal cell carcinoma 2007 s/p partial right nephrectomy    Diabetes mellitus without complication (HCC)    History of kidney cancer    Hypertension    Personal history of chemotherapy    Personal history of radiation therapy    Sleep apnea    Past Surgical History:  Procedure Laterality Date   BIOPSY  09/21/2022   Procedure: BIOPSY;  Surgeon: Cindie Carlin POUR, DO;  [facility];  Service: Endoscopy;;   BREAST LUMPECTOMY Right 2019   BREAST SURGERY     CESAREAN SECTION  1994   COLONOSCOPY     COLONOSCOPY WITH PROPOFOL  N/A 09/21/2022   Procedure: COLONOSCOPY WITH PROPOFOL ;  Surgeon: Cindie Carlin POUR, DO;  Location: [facility];  Service: Endoscopy;  Laterality: N/A;  random colon biopsies, 145pm, asa 2   HYSTEROSCOPY WITH D & C N/A 03/12/2020   Procedure: Hysteroscopy Uterine Curettage;  Surgeon: Jayne Vonn DEL, MD;  Location: [facility];  Service: Gynecology;  Laterality: N/A;   HYSTEROSCOPY WITH D & C N/A 11/11/2020   Procedure: DILATATION AND CURETTAGE /HYSTEROSCOPY;  Surgeon: Ozan, Jennifer, DO;  Location: [facility];  Service: Gynecology;  Laterality: N/A;   partial right nephrectomy  2007   POLYPECTOMY N/A 11/11/2020   Procedure: Myosure POLYPECTOMY;  Surgeon: Marilynn Nest, DO;  Location: [facility];  Service: Gynecology;  Laterality: N/A;   POLYPECTOMY  09/21/2022   Procedure: POLYPECTOMY;  Surgeon: Cindie Carlin POUR, DO;   Location: [facility];  Service: Endoscopy;;    SOCIAL HISTORY:  Social History   Socioeconomic History   Marital status: Married    Spouse name: Not on file   Number of children: 4   Years of education: 10   Highest education level: Not on file  Occupational History   Occupation: makes cookies    Employer: [employer]  Tobacco Use   Smoking status: Never   Smokeless tobacco: Never  Vaping Use   Vaping status: Never Used  Substance and Sexual Activity   Alcohol use: Yes    Alcohol/week: 14.0 standard drinks of alcohol    Types: 14 Cans of beer per week   Drug use: Yes    Types: Marijuana    Comment: every day   Sexual activity: Yes    Birth control/protection: Post-menopausal  Other Topics Concern   Not on file  Social History Narrative   Left handed   One floor home    Lives with husband and her mom   No caffeine   Not employed at this time   Social  Drivers of Health   Tobacco Use: Low Risk (01/28/2024)   Patient History    Smoking Tobacco Use: Never    Smokeless Tobacco Use: Never    Passive Exposure: Not on file  Financial Resource Strain: Low Risk (03/18/2023)   Overall Financial Resource Strain (CARDIA)    Difficulty of Paying Living Expenses: Not very hard  Food Insecurity: No Food Insecurity (03/18/2023)   Hunger Vital Sign    Worried About Running Out of Food in the Last Year: Never true    Ran Out of Food in the Last Year: Never true  Transportation Needs: No Transportation Needs (03/18/2023)   PRAPARE - Administrator, Civil Service (Medical): No    Lack of Transportation (Non-Medical): No  Physical Activity: Inactive (02/16/2022)   Exercise Vital Sign    Days of Exercise per Week: 0 days    Minutes of Exercise per Session: 0 min  Stress: No Stress Concern Present (02/16/2022)   [employer] of Occupational Health - Occupational Stress Questionnaire    Feeling of Stress : Only a little  Social Connections: Moderately Integrated (02/16/2022)    Social Connection and Isolation Panel    Frequency of Communication with Friends and Family: More than three times a week    Frequency of Social Gatherings with Friends and Family: Once a week    Attends Religious Services: 1 to 4 times per year    Active Member of [employer] or Organizations: No    Attends Banker Meetings: Never    Marital Status: Married  Catering Manager Violence: Not At Risk (02/16/2022)   Humiliation, Afraid, Rape, and Kick questionnaire    Fear of Current or Ex-Partner: No    Emotionally Abused: No    Physically Abused: No    Sexually Abused: No  Depression (PHQ2-9): Low Risk (10/19/2023)   Depression (PHQ2-9)    PHQ-2 Score: 0  Alcohol Screen: Medium Risk (02/16/2022)   Alcohol Screen    Last Alcohol Screening Score (AUDIT): 9  Housing: Low Risk (03/18/2023)   Housing Stability Vital Sign    Unable to Pay for Housing in the Last Year: No    Number of Times Moved in the Last Year: 0    Homeless in the Last Year: No  Utilities: Not At Risk (03/18/2023)   AHC Utilities    Threatened with loss of utilities: No  Health Literacy: Not on file    FAMILY HISTORY:  Family History  Problem Relation Age of Onset   Other Father        auto accident   Cancer Maternal Grandmother        stomach   Cancer Maternal Grandfather        kidney cancer   Colon cancer Neg Hx     CURRENT MEDICATIONS:  Current Outpatient Medications  Medication Sig Dispense Refill   albuterol  (PROVENTIL ) (2.5 MG/3ML) 0.083% nebulizer solution Take 3 mLs (2.5 mg total) by nebulization every 6 (six) hours as needed for wheezing or shortness of breath. 75 mL 12   albuterol  (VENTOLIN  HFA) 108 (90 Base) MCG/ACT inhaler Inhale 1-2 puffs into the lungs every 6 (six) hours as needed for wheezing or shortness of breath. 1 each 0   amLODipine  (NORVASC ) 5 MG tablet TAKE 1 TABLET(5 MG) BY MOUTH DAILY 90 tablet 1   benazepril  (LOTENSIN ) 10 MG tablet TAKE 1 TABLET(10 MG) BY MOUTH DAILY 90 tablet 3    cyanocobalamin  (VITAMIN B12) 1000 MCG tablet Take 1,000 mcg  by mouth daily.     ergocalciferol  (VITAMIN D2) 1.25 MG (50000 UT) capsule Take 1 capsule (50,000 Units total) by mouth once a week. 4 capsule 5   fluticasone -salmeterol (ADVAIR) 100-50 MCG/ACT AEPB Inhale 1 puff into the lungs 2 (two) times daily. 1 each 3   gabapentin  (NEURONTIN ) 600 MG tablet TAKE 1 TABLET(600 MG) BY MOUTH TWICE DAILY AS NEEDED 60 tablet 3   meloxicam  (MOBIC ) 7.5 MG tablet Take 7.5 mg by mouth daily.     metFORMIN  (GLUCOPHAGE ) 500 MG tablet Take 1 tablet (500 mg total) by mouth 2 (two) times daily with a meal. 180 tablet 3   methocarbamol  (ROBAXIN ) 750 MG tablet Take 1 tablet (750 mg total) by mouth 4 (four) times daily. 60 tablet 2   montelukast  (SINGULAIR ) 10 MG tablet Take 1 tablet (10 mg total) by mouth at bedtime. 30 tablet 3   Omega-3 Fatty Acids (FISH OIL PO) Take by mouth.     omeprazole  (PRILOSEC) 40 MG capsule Take 1 capsule (40 mg total) by mouth every morning. 30 capsule 3   rosuvastatin  (CRESTOR ) 5 MG tablet Take 1 tablet (5 mg total) by mouth daily. 90 tablet 3   sertraline  (ZOLOFT ) 50 MG tablet Take 50 mg by mouth daily.     Thiamine HCl (VITAMIN B-1) 250 MG tablet Take 250 mg by mouth daily.     venlafaxine  (EFFEXOR ) 75 MG tablet Take by mouth daily.     No current facility-administered medications for this visit.    ALLERGIES:  Allergies[1]  LABORATORY DATA:  I have reviewed the labs as listed.     Latest Ref Rng & Units 10/19/2023    9:22 AM 06/15/2023    9:03 AM 03/03/2023    6:45 AM  CBC  WBC 3.4 - 10.8 x10E3/uL 4.5  4.1  3.4   Hemoglobin 11.1 - 15.9 g/dL 86.5  86.6  87.2   Hematocrit 34.0 - 46.6 % 41.5  39.1  38.8   Platelets 150 - 450 x10E3/uL 272  276  240       Latest Ref Rng & Units 10/19/2023    9:22 AM 06/15/2023    9:03 AM 03/03/2023    6:45 AM  CMP  Glucose 70 - 99 mg/dL 95  895  896   BUN 8 - 27 mg/dL 8  8  7    Creatinine 0.57 - 1.00 mg/dL 9.32  9.33  9.42   Sodium 134 -  144 mmol/L 144  145  137   Potassium 3.5 - 5.2 mmol/L 4.3  4.3  3.3   Chloride 96 - 106 mmol/L 102  105  105   CO2 20 - 29 mmol/L 24  25  25    Calcium  8.7 - 10.3 mg/dL 89.9  9.8  8.6   Total Protein 6.5 - 8.1 g/dL   6.8   Total Bilirubin <1.2 mg/dL   0.4   Alkaline Phos 38 - 126 U/L   73   AST 15 - 41 U/L   27   ALT 0 - 44 U/L   24     DIAGNOSTIC IMAGING:  I have independently reviewed the scans and discussed with the patient. DG HIP UNILAT W OR W/O PELVIS 2-3 VIEWS LEFT Result Date: 02/15/2024 Imaging report left hip Anterior left hip pain AP lateral left hip AP pelvis was done outside.  The patient shows subtle findings consistent with osteoarthritis of the hip with abnormal head shape osteophytes and decreased femoral head-neck offset Minimal joint  space narrowing OA left hip mild   DG Pelvis 1-2 Views Result Date: 01/28/2024 EXAM: 1 or 2 VIEW(S) XRAY OF THE PELVIS 01/28/2024 08:34:21 PM COMPARISON: None available. CLINICAL HISTORY: pain pain FINDINGS: BONES AND JOINTS: No acute fracture. No focal osseous lesion. No joint dislocation. SOFT TISSUES: Round calcifications in the pelvis are likely phleboliths. IMPRESSION: 1. No acute findings. Electronically signed by: Dorethia Molt MD 01/28/2024 08:38 PM EST RP Workstation: [id number]     WRAP UP:  All questions were answered. The patient knows to call the clinic with any problems, questions or concerns.  Medical decision making: ***  Time spent on visit: I spent {CHL ONC TIME VISIT - DTPQU:[id number]} counseling the patient face to face. The total time spent in the appointment was {CHL ONC TIME VISIT - DTPQU:[id number]} and more than 50% was on counseling.  Pleasant CHRISTELLA Barefoot, PA-C  ***    [1] No Known Allergies

## 2024-02-27 ENCOUNTER — Inpatient Hospital Stay: Payer: BC Managed Care – PPO

## 2024-02-28 ENCOUNTER — Encounter: Payer: Self-pay | Admitting: Obstetrics & Gynecology

## 2024-02-28 ENCOUNTER — Ambulatory Visit: Admitting: Obstetrics & Gynecology

## 2024-02-28 VITALS — BP 141/74 | HR 81 | Ht 66.0 in | Wt 195.0 lb

## 2024-02-28 DIAGNOSIS — Z01419 Encounter for gynecological examination (general) (routine) without abnormal findings: Secondary | ICD-10-CM

## 2024-02-28 NOTE — Progress Notes (Signed)
 Tried calling patient to come in and get labs tomorrow. No answer, LVM to call back.

## 2024-02-28 NOTE — Progress Notes (Signed)
 Subjective:     Jennifer Harvey is a 63 y.o. female here for a routine exam.  Patient's last menstrual period was 04/29/2020. G4P0 Birth Control Method:  menopausal Menstrual Calendar(currently): amenorrhea  Current complaints: no gyn issues.   Current acute medical issues:  recent left hip flexor tendinitis   Recent Gynecologic History Patient's last menstrual period was 04/29/2020. Last Pap: 02/2023,  normal Last mammogram: 12/24,  normal  Past Medical History:  Diagnosis Date   Breast cancer (HCC)    Cancer (HCC)    renal cell carcinoma 2007 s/p partial right nephrectomy    Diabetes mellitus without complication (HCC)    History of kidney cancer    Hypertension    Personal history of chemotherapy    Personal history of radiation therapy    Sleep apnea     Past Surgical History:  Procedure Laterality Date   BIOPSY  09/21/2022   Procedure: BIOPSY;  Surgeon: Cindie Carlin POUR, DO;  [facility]: [facility];  Service: Endoscopy;;   BREAST LUMPECTOMY Right 2019   BREAST SURGERY     CESAREAN SECTION  1994   COLONOSCOPY     COLONOSCOPY WITH PROPOFOL  N/A 09/21/2022   Procedure: COLONOSCOPY WITH PROPOFOL ;  Surgeon: Cindie Carlin POUR, DO;  [facility]: [facility];  Service: Endoscopy;  Laterality: N/A;  random colon biopsies, 145pm, asa 2   HYSTEROSCOPY WITH D & C N/A 03/12/2020   Procedure: Hysteroscopy Uterine Curettage;  Surgeon: Jayne Vonn DEL, MD;  Location: [facility];  Service: Gynecology;  Laterality: N/A;   HYSTEROSCOPY WITH D & C N/A 11/11/2020   Procedure: DILATATION AND CURETTAGE /HYSTEROSCOPY;  Surgeon: Ozan, Jennifer, DO;  Location: [facility];  Service: Gynecology;  Laterality: N/A;   partial right nephrectomy  2007   POLYPECTOMY N/A 11/11/2020   Procedure: Myosure POLYPECTOMY;  Surgeon: Marilynn Nest, DO;  Location: [facility];  Service: Gynecology;  Laterality: N/A;   POLYPECTOMY  09/21/2022   Procedure: POLYPECTOMY;  Surgeon: Cindie Carlin POUR, DO;  [facility]: [facility];   Service: Endoscopy;;    OB History     Gravida  4   Para      Term      Preterm      AB      Living  3      SAB      IAB      Ectopic      Multiple      Live Births  3           Social History   Socioeconomic History   Marital status: Married    Spouse name: Not on file   Number of children: 4   Years of education: 10   Highest education level: Not on file  Occupational History   Occupation: makes cookies    Employer: [employer]  Tobacco Use   Smoking status: Never   Smokeless tobacco: Never  Vaping Use   Vaping status: Never Used  Substance and Sexual Activity   Alcohol use: Yes    Alcohol/week: 14.0 standard drinks of alcohol    Types: 14 Cans of beer per week   Drug use: Yes    Types: Marijuana    Comment: every day   Sexual activity: Yes    Birth control/protection: Post-menopausal  Other Topics Concern   Not on file  Social History Narrative   Left handed   One floor home    Lives with husband and her mom   No  caffeine   Not employed at this time   Social Drivers of Health   Tobacco Use: Low Risk (02/28/2024)   Patient History    Smoking Tobacco Use: Never    Smokeless Tobacco Use: Never    Passive Exposure: Not on file  Financial Resource Strain: High Risk (02/28/2024)   Overall Financial Resource Strain (CARDIA)    Difficulty of Paying Living Expenses: Hard  Food Insecurity: No Food Insecurity (02/28/2024)   Epic    Worried About Programme Researcher, Broadcasting/film/video in the Last Year: Never true    Ran Out of Food in the Last Year: Never true  Transportation Needs: No Transportation Needs (02/28/2024)   Epic    Lack of Transportation (Medical): No    Lack of Transportation (Non-Medical): No  Physical Activity: Insufficiently Active (02/28/2024)   Exercise Vital Sign    Days of Exercise per Week: 5 days    Minutes of Exercise per Session: 20 min  Stress: Stress Concern Present (02/28/2024)   [employer] of Occupational Health -  Occupational Stress Questionnaire    Feeling of Stress: To some extent  Social Connections: Moderately Isolated (02/28/2024)   Social Connection and Isolation Panel    Frequency of Communication with Friends and Family: More than three times a week    Frequency of Social Gatherings with Friends and Family: Once a week    Attends Religious Services: Never    Database Administrator or Organizations: No    Attends Banker Meetings: Never    Marital Status: Married  Depression (PHQ2-9): Low Risk (02/28/2024)   Depression (PHQ2-9)    PHQ-2 Score: 3  Alcohol Screen: Low Risk (02/28/2024)   Alcohol Screen    Last Alcohol Screening Score (AUDIT): 3  Housing: Unknown (02/28/2024)   Epic    Unable to Pay for Housing in the Last Year: No    Number of Times Moved in the Last Year: Not on file    Homeless in the Last Year: No  Utilities: Not At Risk (02/28/2024)   Epic    Threatened with loss of utilities: No  Health Literacy: Adequate Health Literacy (02/28/2024)   B1300 Health Literacy    Frequency of need for help with medical instructions: Never    Family History  Problem Relation Age of Onset   Other Father        auto accident   Cancer Maternal Grandmother        stomach   Cancer Maternal Grandfather        kidney cancer   Colon cancer Neg Hx     Current Medications[1]  Review of Systems  Review of Systems  Constitutional: Negative for fever, chills, weight loss, malaise/fatigue and diaphoresis.  HENT: Negative for hearing loss, ear pain, nosebleeds, congestion, sore throat, neck pain, tinnitus and ear discharge.   Eyes: Negative for blurred vision, double vision, photophobia, pain, discharge and redness.  Respiratory: Negative for cough, hemoptysis, sputum production, shortness of breath, wheezing and stridor.   Cardiovascular: Negative for chest pain, palpitations, orthopnea, claudication, leg swelling and PND.  Gastrointestinal: negative for abdominal pain.  Negative for heartburn, nausea, vomiting, diarrhea, constipation, blood in stool and melena.  Genitourinary: Negative for dysuria, urgency, frequency, hematuria and flank pain.  Musculoskeletal: Negative for myalgias, back pain, joint pain and falls.  Skin: Negative for itching and rash.  Neurological: Negative for dizziness, tingling, tremors, sensory change, speech change, focal weakness, seizures, loss of consciousness, weakness and headaches.  Endo/Heme/Allergies: Negative  for environmental allergies and polydipsia. Does not bruise/bleed easily.  Psychiatric/Behavioral: Negative for depression, suicidal ideas, hallucinations, memory loss and substance abuse. The patient is not nervous/anxious and does not have insomnia.        Objective:  Blood pressure (!) 141/74, pulse 81, height 5' 6 (1.676 m), weight 195 lb (88.5 kg), last menstrual period 04/29/2020.   Physical Exam  Vitals reviewed. Constitutional: She is oriented to person, place, and time. She appears well-developed and well-nourished.  HENT:  Head: Normocephalic and atraumatic.        Right Ear: External ear normal.  Left Ear: External ear normal.  Nose: Nose normal.  Mouth/Throat: Oropharynx is clear and moist.  Eyes: Conjunctivae and EOM are normal. Pupils are equal, round, and reactive to light. Right eye exhibits no discharge. Left eye exhibits no discharge. No scleral icterus.  Neck: Normal range of motion. Neck supple. No tracheal deviation present. No thyromegaly present.  Cardiovascular: Normal rate, regular rhythm, normal heart sounds and intact distal pulses.  Exam reveals no gallop and no friction rub.   No murmur heard. Respiratory: Effort normal and breath sounds normal. No respiratory distress. She has no wheezes. She has no rales. She exhibits no tenderness.  GI: Soft. Bowel sounds are normal. She exhibits no distension and no mass. There is no tenderness. There is no rebound and no guarding.  Genitourinary:   Breasts no masses skin changes or nipple changes bilaterally      Vulva is normal without lesions Vagina is pink moist without discharge Cervix normal in appearance and pap is done Uterus is normal size shape and contour Adnexa is negative with normal sized ovaries   Musculoskeletal: Normal range of motion. She exhibits no edema and no tenderness.  Neurological: She is alert and oriented to person, place, and time. She has normal reflexes. She displays normal reflexes. No cranial nerve deficit. She exhibits normal muscle tone. Coordination normal.  Skin: Skin is warm and dry. No rash noted. No erythema. No pallor.  Psychiatric: She has a normal mood and affect. Her behavior is normal. Judgment and thought content normal.       Medications Ordered at today's visit: No orders of the defined types were placed in this encounter.   Other orders placed at today's visit: No orders of the defined types were placed in this encounter.    ASSESSMENT + PLAN:    ICD-10-CM   1. Well woman exam with routine gynecological exam  Z01.419           Return in about 3 years (around 02/28/2027), or if symptoms worsen or fail to improve.     [1]  Current Outpatient Medications:    albuterol  (PROVENTIL ) (2.5 MG/3ML) 0.083% nebulizer solution, Take 3 mLs (2.5 mg total) by nebulization every 6 (six) hours as needed for wheezing or shortness of breath., Disp: 75 mL, Rfl: 12   albuterol  (VENTOLIN  HFA) 108 (90 Base) MCG/ACT inhaler, Inhale 1-2 puffs into the lungs every 6 (six) hours as needed for wheezing or shortness of breath., Disp: 1 each, Rfl: 0   amLODipine  (NORVASC ) 5 MG tablet, TAKE 1 TABLET(5 MG) BY MOUTH DAILY, Disp: 90 tablet, Rfl: 1   benazepril  (LOTENSIN ) 10 MG tablet, TAKE 1 TABLET(10 MG) BY MOUTH DAILY, Disp: 90 tablet, Rfl: 3   cyanocobalamin  (VITAMIN B12) 1000 MCG tablet, Take 1,000 mcg by mouth daily., Disp: , Rfl:    dicyclomine  (BENTYL ) 20 MG tablet, Take 20 mg by mouth 4 (four)  times  daily as needed., Disp: , Rfl:    ergocalciferol  (VITAMIN D2) 1.25 MG (50000 UT) capsule, Take 1 capsule (50,000 Units total) by mouth once a week., Disp: 4 capsule, Rfl: 5   fluticasone -salmeterol (ADVAIR) 100-50 MCG/ACT AEPB, Inhale 1 puff into the lungs 2 (two) times daily., Disp: 1 each, Rfl: 3   gabapentin  (NEURONTIN ) 600 MG tablet, TAKE 1 TABLET(600 MG) BY MOUTH TWICE DAILY AS NEEDED, Disp: 60 tablet, Rfl: 3   metFORMIN  (GLUCOPHAGE ) 500 MG tablet, Take 1 tablet (500 mg total) by mouth 2 (two) times daily with a meal., Disp: 180 tablet, Rfl: 3   methocarbamol  (ROBAXIN ) 750 MG tablet, Take 1 tablet (750 mg total) by mouth 4 (four) times daily. (Patient taking differently: Take 750 mg by mouth as needed for muscle spasms.), Disp: 60 tablet, Rfl: 2   montelukast  (SINGULAIR ) 10 MG tablet, Take 1 tablet (10 mg total) by mouth at bedtime., Disp: 30 tablet, Rfl: 3   Omega-3 Fatty Acids (FISH OIL PO), Take by mouth., Disp: , Rfl:    omeprazole  (PRILOSEC) 40 MG capsule, Take 1 capsule (40 mg total) by mouth every morning., Disp: 30 capsule, Rfl: 3   rosuvastatin  (CRESTOR ) 5 MG tablet, Take 1 tablet (5 mg total) by mouth daily., Disp: 90 tablet, Rfl: 3   sertraline  (ZOLOFT ) 50 MG tablet, Take 50 mg by mouth daily., Disp: , Rfl:    Thiamine HCl (VITAMIN B-1) 250 MG tablet, Take 250 mg by mouth daily., Disp: , Rfl:    venlafaxine  (EFFEXOR ) 75 MG tablet, Take by mouth daily., Disp: , Rfl:    meloxicam  (MOBIC ) 7.5 MG tablet, Take 7.5 mg by mouth daily. (Patient not taking: Reported on 02/28/2024), Disp: , Rfl:

## 2024-02-29 ENCOUNTER — Inpatient Hospital Stay: Attending: Physician Assistant

## 2024-02-29 DIAGNOSIS — Z801 Family history of malignant neoplasm of trachea, bronchus and lung: Secondary | ICD-10-CM | POA: Insufficient documentation

## 2024-02-29 DIAGNOSIS — Z905 Acquired absence of kidney: Secondary | ICD-10-CM | POA: Diagnosis not present

## 2024-02-29 DIAGNOSIS — Z79899 Other long term (current) drug therapy: Secondary | ICD-10-CM | POA: Diagnosis not present

## 2024-02-29 DIAGNOSIS — Z800 Family history of malignant neoplasm of digestive organs: Secondary | ICD-10-CM | POA: Diagnosis not present

## 2024-02-29 DIAGNOSIS — Z6830 Body mass index (BMI) 30.0-30.9, adult: Secondary | ICD-10-CM

## 2024-02-29 DIAGNOSIS — Z08 Encounter for follow-up examination after completed treatment for malignant neoplasm: Secondary | ICD-10-CM | POA: Diagnosis present

## 2024-02-29 DIAGNOSIS — Z923 Personal history of irradiation: Secondary | ICD-10-CM | POA: Diagnosis not present

## 2024-02-29 DIAGNOSIS — Z9221 Personal history of antineoplastic chemotherapy: Secondary | ICD-10-CM | POA: Insufficient documentation

## 2024-02-29 DIAGNOSIS — Z8051 Family history of malignant neoplasm of kidney: Secondary | ICD-10-CM | POA: Insufficient documentation

## 2024-02-29 DIAGNOSIS — Z853 Personal history of malignant neoplasm of breast: Secondary | ICD-10-CM | POA: Diagnosis present

## 2024-02-29 DIAGNOSIS — E669 Obesity, unspecified: Secondary | ICD-10-CM

## 2024-02-29 DIAGNOSIS — Z85528 Personal history of other malignant neoplasm of kidney: Secondary | ICD-10-CM | POA: Diagnosis not present

## 2024-02-29 DIAGNOSIS — Z170 Estrogen receptor positive status [ER+]: Secondary | ICD-10-CM

## 2024-02-29 LAB — CBC WITH DIFFERENTIAL/PLATELET
Abs Immature Granulocytes: 0.01 K/uL (ref 0.00–0.07)
Basophils Absolute: 0.1 K/uL (ref 0.0–0.1)
Basophils Relative: 1 %
Eosinophils Absolute: 0.1 K/uL (ref 0.0–0.5)
Eosinophils Relative: 3 %
HCT: 37.9 % (ref 36.0–46.0)
Hemoglobin: 12.6 g/dL (ref 12.0–15.0)
Immature Granulocytes: 0 %
Lymphocytes Relative: 52 %
Lymphs Abs: 2.5 K/uL (ref 0.7–4.0)
MCH: 29.6 pg (ref 26.0–34.0)
MCHC: 33.2 g/dL (ref 30.0–36.0)
MCV: 89 fL (ref 80.0–100.0)
Monocytes Absolute: 0.5 K/uL (ref 0.1–1.0)
Monocytes Relative: 11 %
Neutro Abs: 1.6 K/uL — ABNORMAL LOW (ref 1.7–7.7)
Neutrophils Relative %: 33 %
Platelets: 294 K/uL (ref 150–400)
RBC: 4.26 MIL/uL (ref 3.87–5.11)
RDW: 13.3 % (ref 11.5–15.5)
WBC: 4.7 K/uL (ref 4.0–10.5)
nRBC: 0 % (ref 0.0–0.2)

## 2024-02-29 LAB — COMPREHENSIVE METABOLIC PANEL WITH GFR
ALT: 26 U/L (ref 0–44)
AST: 23 U/L (ref 15–41)
Albumin: 4.3 g/dL (ref 3.5–5.0)
Alkaline Phosphatase: 103 U/L (ref 38–126)
Anion gap: 12 (ref 5–15)
BUN: 8 mg/dL (ref 8–23)
CO2: 26 mmol/L (ref 22–32)
Calcium: 9.8 mg/dL (ref 8.9–10.3)
Chloride: 106 mmol/L (ref 98–111)
Creatinine, Ser: 0.64 mg/dL (ref 0.44–1.00)
GFR, Estimated: >60 mL/min (ref >60)
Glucose, Bld: 98 mg/dL (ref 70–99)
Potassium: 4.1 mmol/L (ref 3.5–5.1)
Sodium: 144 mmol/L (ref 135–145)
Total Bilirubin: 0.2 mg/dL (ref 0.0–1.2)
Total Protein: 7.4 g/dL (ref 6.5–8.1)

## 2024-02-29 LAB — VITAMIN D 25 HYDROXY (VIT D DEFICIENCY, FRACTURES): Vit D, 25-Hydroxy: 27.3 ng/mL — ABNORMAL LOW (ref 30–100)

## 2024-02-29 NOTE — Code Documentation (Signed)
 Patient returned call, informed her that she needs to come in today to get labs done for her visit tomorrow, patient verbalized understanding and stated she will come in today. Transferred patient to scheduler to have her added to lab schedule.

## 2024-02-29 NOTE — Progress Notes (Signed)
 Tried contacting patient again this morning. LVM

## 2024-02-29 NOTE — Addendum Note (Signed)
 Addended by: FERDIE FINE B on: 02/29/2024 12:31 PM   Modules accepted: Orders

## 2024-03-01 ENCOUNTER — Ambulatory Visit: Payer: BC Managed Care – PPO | Admitting: Oncology

## 2024-03-01 ENCOUNTER — Inpatient Hospital Stay: Admitting: Physician Assistant

## 2024-03-01 VITALS — BP 128/77 | HR 76 | Temp 97.9°F | Resp 18 | Ht 66.0 in | Wt 193.0 lb

## 2024-03-01 DIAGNOSIS — C50211 Malignant neoplasm of upper-inner quadrant of right female breast: Secondary | ICD-10-CM

## 2024-03-01 DIAGNOSIS — Z170 Estrogen receptor positive status [ER+]: Secondary | ICD-10-CM

## 2024-03-01 DIAGNOSIS — Z08 Encounter for follow-up examination after completed treatment for malignant neoplasm: Secondary | ICD-10-CM | POA: Diagnosis not present

## 2024-03-01 DIAGNOSIS — E559 Vitamin D deficiency, unspecified: Secondary | ICD-10-CM | POA: Diagnosis not present

## 2024-03-01 MED ORDER — VITAMIN D 25 MCG (1000 UNIT) PO TABS: 2000.0000 [IU] | ORAL_TABLET | Freq: Every day | ORAL | 3 refills | Status: AC

## 2024-03-01 NOTE — Patient Instructions (Signed)
 [facility] at [facility] **VISIT SUMMARY & IMPORTANT INSTRUCTIONS **   You were seen today by Pleasant Barefoot PA-C for your follow-up visit.    HISTORY OF BREAST CANCER Your most recent labs and physical exam did not show any evidence of recurrent breast cancer. You have already completed treatment of your breast cancer with tamoxifen  (December 2019 through December 2024). You are due for your annual screening mammogram now.  We will get this scheduled for you. Otherwise, you are stable for discharge to your primary care provider. You should continue to receive your breast exam and mammogram once a year, which will next be due in 2026.  This can be done by your primary care provider office.  VITAMIN D  DEFICIENCY Start taking vitamin D  2000 units daily.  Prescription has been sent to your pharmacy.  ** Thank you for trusting me with your healthcare!  I strive to provide all of my patients with quality care at each visit.  If you receive a survey for this visit, I would be so grateful to you for taking the time to provide feedback.  Thank you in advance!  ~ Mayleigh Tetrault                                        Dr. Mickiel Davonna Pleasant Barefoot, PA-C          Delon Hope, NP   - - - - - - - - - - - - - - - - - -    Thank you for choosing [facility] at [facility] to provide your oncology and hematology care.  To afford each patient quality time with our provider, please arrive at least 15 minutes before your scheduled appointment time.   If you have a lab appointment with the Cancer Center please come in thru the Main Entrance and check in at the main information desk.  You need to re-schedule your appointment should you arrive 10 or more minutes late.  We strive to give you quality time with our providers, and arriving late affects you and other patients whose appointments are after yours.  Also, if you no show three or more times for  appointments you may be dismissed from the clinic at the providers discretion.     Again, thank you for choosing [facility].  Our hope is that these requests will decrease the amount of time that you wait before being seen by our physicians.       _____________________________________________________________  Should you have questions after your visit to [facility], please contact our office at [phone] and follow the prompts.  Our office hours are 8:00 a.m. and 4:30 p.m. Monday - Friday.  Please note that voicemails left after 4:00 p.m. may not be returned until the following business day.  We are closed weekends and major holidays.  You do have access to a nurse 24-7, just call the main number to the clinic [phone] and do not press any options, hold on the line and a nurse will answer the phone.    For prescription refill requests, have your pharmacy contact our office and allow 72 hours.

## 2024-03-05 ENCOUNTER — Inpatient Hospital Stay: Payer: BC Managed Care – PPO | Admitting: Physician Assistant

## 2024-03-09 ENCOUNTER — Other Ambulatory Visit: Payer: Self-pay | Admitting: Family Medicine

## 2024-03-12 ENCOUNTER — Encounter: Payer: Self-pay | Admitting: *Deleted

## 2024-03-14 ENCOUNTER — Ambulatory Visit (HOSPITAL_COMMUNITY)
Admission: RE | Admit: 2024-03-14 | Discharge: 2024-03-14 | Disposition: A | Discharge status: Home / Self Care | Source: Ambulatory Visit | Attending: Physician Assistant | Admitting: Physician Assistant

## 2024-03-14 DIAGNOSIS — C50211 Malignant neoplasm of upper-inner quadrant of right female breast: Secondary | ICD-10-CM | POA: Diagnosis not present

## 2024-03-14 DIAGNOSIS — Z1231 Encounter for screening mammogram for malignant neoplasm of breast: Secondary | ICD-10-CM | POA: Diagnosis not present

## 2024-03-24 ENCOUNTER — Other Ambulatory Visit: Payer: Self-pay | Admitting: Family Medicine

## 2024-04-14 ENCOUNTER — Other Ambulatory Visit: Payer: Self-pay | Admitting: Family Medicine
